# Patient Record
Sex: Female | Born: 1935
Health system: Southern US, Community
[De-identification: ages and names within clinical notes are randomized; demographics above are authoritative.]

## PROBLEM LIST (undated history)

## (undated) DIAGNOSIS — C189 Malignant neoplasm of colon, unspecified: Secondary | ICD-10-CM

## (undated) DIAGNOSIS — Z8619 Personal history of other infectious and parasitic diseases: Secondary | ICD-10-CM

## (undated) DIAGNOSIS — E079 Disorder of thyroid, unspecified: Secondary | ICD-10-CM

## (undated) DIAGNOSIS — F32A Depression, unspecified: Secondary | ICD-10-CM

## (undated) DIAGNOSIS — I10 Essential (primary) hypertension: Secondary | ICD-10-CM

## (undated) DIAGNOSIS — E039 Hypothyroidism, unspecified: Secondary | ICD-10-CM

## (undated) DIAGNOSIS — S62309A Unspecified fracture of unspecified metacarpal bone, initial encounter for closed fracture: Secondary | ICD-10-CM

## (undated) DIAGNOSIS — F329 Major depressive disorder, single episode, unspecified: Secondary | ICD-10-CM

## (undated) DIAGNOSIS — H3530 Unspecified macular degeneration: Secondary | ICD-10-CM

## (undated) HISTORY — PX: HERNIA REPAIR: SHX51

## (undated) HISTORY — PX: COLONOSCOPY: SHX174

---

## 1993-03-15 HISTORY — PX: CHOLECYSTECTOMY: SHX55

## 2005-06-22 ENCOUNTER — Ambulatory Visit: Payer: Self-pay | Admitting: Family Medicine

## 2006-06-28 ENCOUNTER — Ambulatory Visit: Payer: Self-pay | Admitting: Family Medicine

## 2007-08-30 ENCOUNTER — Ambulatory Visit: Payer: Self-pay | Admitting: Family Medicine

## 2010-01-14 ENCOUNTER — Ambulatory Visit: Payer: Self-pay | Admitting: Internal Medicine

## 2010-02-25 ENCOUNTER — Inpatient Hospital Stay: Payer: Self-pay | Admitting: Surgery

## 2010-02-25 HISTORY — PX: COLON SURGERY: SHX602

## 2010-02-25 HISTORY — PX: UMBILICAL HERNIA REPAIR: SHX196

## 2010-02-25 HISTORY — PX: ESOPHAGOGASTRODUODENOSCOPY: SHX1529

## 2010-02-26 LAB — PATHOLOGY REPORT

## 2010-03-30 ENCOUNTER — Ambulatory Visit: Payer: Self-pay | Admitting: Oncology

## 2010-04-15 ENCOUNTER — Ambulatory Visit: Payer: Self-pay | Admitting: Oncology

## 2010-06-11 ENCOUNTER — Ambulatory Visit: Payer: Self-pay | Admitting: Unknown Physician Specialty

## 2010-06-15 LAB — PATHOLOGY REPORT

## 2010-06-29 ENCOUNTER — Ambulatory Visit: Payer: Self-pay | Admitting: Oncology

## 2010-07-14 ENCOUNTER — Ambulatory Visit: Payer: Self-pay | Admitting: Oncology

## 2010-11-25 ENCOUNTER — Ambulatory Visit: Payer: Self-pay | Admitting: General Practice

## 2010-12-09 ENCOUNTER — Ambulatory Visit: Payer: Self-pay | Admitting: General Practice

## 2010-12-09 HISTORY — PX: CARPAL TUNNEL RELEASE: SHX101

## 2010-12-22 ENCOUNTER — Emergency Department: Payer: Self-pay | Admitting: Emergency Medicine

## 2012-02-02 ENCOUNTER — Ambulatory Visit: Payer: Self-pay | Admitting: Internal Medicine

## 2013-02-15 ENCOUNTER — Ambulatory Visit: Payer: Self-pay | Admitting: Internal Medicine

## 2013-05-11 ENCOUNTER — Ambulatory Visit: Payer: Self-pay | Admitting: Unknown Physician Specialty

## 2014-07-04 ENCOUNTER — Ambulatory Visit
Admit: 2014-07-04 | Disposition: A | Discharge status: Home / Self Care | Payer: Self-pay | Attending: Unknown Physician Specialty | Admitting: Unknown Physician Specialty

## 2014-07-10 ENCOUNTER — Ambulatory Visit
Admit: 2014-07-10 | Disposition: A | Discharge status: Home / Self Care | Payer: Self-pay | Attending: Internal Medicine | Admitting: Internal Medicine

## 2015-06-14 HISTORY — PX: MULTIPLE TOOTH EXTRACTIONS: SHX2053

## 2016-02-17 ENCOUNTER — Inpatient Hospital Stay: Admission: RE | Admit: 2016-02-17 | Payer: Self-pay | Source: Ambulatory Visit

## 2016-02-25 ENCOUNTER — Ambulatory Visit: Admit: 2016-02-25 | Payer: Self-pay | Admitting: Orthopedic Surgery

## 2016-02-25 SURGERY — EXCISION, GANGLION CYST, WRIST
Anesthesia: Choice | Laterality: Right

## 2016-06-13 DIAGNOSIS — S62309A Unspecified fracture of unspecified metacarpal bone, initial encounter for closed fracture: Secondary | ICD-10-CM

## 2016-06-13 HISTORY — DX: Unspecified fracture of unspecified metacarpal bone, initial encounter for closed fracture: S62.309A

## 2016-06-28 ENCOUNTER — Encounter: Payer: Self-pay | Admitting: Emergency Medicine

## 2016-06-28 ENCOUNTER — Emergency Department
Admission: EM | Admit: 2016-06-28 | Discharge: 2016-06-28 | Disposition: A | Discharge status: Home / Self Care | Payer: Medicare Other | Attending: Emergency Medicine | Admitting: Emergency Medicine

## 2016-06-28 ENCOUNTER — Emergency Department: Payer: Medicare Other

## 2016-06-28 DIAGNOSIS — Z79899 Other long term (current) drug therapy: Secondary | ICD-10-CM | POA: Diagnosis not present

## 2016-06-28 DIAGNOSIS — Y939 Activity, unspecified: Secondary | ICD-10-CM | POA: Insufficient documentation

## 2016-06-28 DIAGNOSIS — S62645A Nondisplaced fracture of proximal phalanx of left ring finger, initial encounter for closed fracture: Secondary | ICD-10-CM | POA: Insufficient documentation

## 2016-06-28 DIAGNOSIS — S62647A Nondisplaced fracture of proximal phalanx of left little finger, initial encounter for closed fracture: Secondary | ICD-10-CM | POA: Diagnosis not present

## 2016-06-28 DIAGNOSIS — Y999 Unspecified external cause status: Secondary | ICD-10-CM | POA: Diagnosis not present

## 2016-06-28 DIAGNOSIS — Z87891 Personal history of nicotine dependence: Secondary | ICD-10-CM | POA: Diagnosis not present

## 2016-06-28 DIAGNOSIS — Y92009 Unspecified place in unspecified non-institutional (private) residence as the place of occurrence of the external cause: Secondary | ICD-10-CM | POA: Insufficient documentation

## 2016-06-28 DIAGNOSIS — Z85038 Personal history of other malignant neoplasm of large intestine: Secondary | ICD-10-CM | POA: Diagnosis not present

## 2016-06-28 DIAGNOSIS — S6992XA Unspecified injury of left wrist, hand and finger(s), initial encounter: Secondary | ICD-10-CM | POA: Diagnosis present

## 2016-06-28 DIAGNOSIS — S60222A Contusion of left hand, initial encounter: Secondary | ICD-10-CM | POA: Insufficient documentation

## 2016-06-28 DIAGNOSIS — W19XXXA Unspecified fall, initial encounter: Secondary | ICD-10-CM

## 2016-06-28 DIAGNOSIS — S00432A Contusion of left ear, initial encounter: Secondary | ICD-10-CM | POA: Insufficient documentation

## 2016-06-28 DIAGNOSIS — W0110XA Fall on same level from slipping, tripping and stumbling with subsequent striking against unspecified object, initial encounter: Secondary | ICD-10-CM | POA: Insufficient documentation

## 2016-06-28 HISTORY — DX: Malignant neoplasm of colon, unspecified: C18.9

## 2016-06-28 HISTORY — DX: Disorder of thyroid, unspecified: E07.9

## 2016-06-28 MED ORDER — TRAMADOL HCL 50 MG PO TABS: 50.0000 mg | ORAL_TABLET | Freq: Four times a day (QID) | ORAL | 0 refills | Status: DC | PRN

## 2016-06-28 NOTE — ED Notes (Addendum)
FIRST NURSE: pt brought over from [facility] with reports of having a fall this am at home hitting the front of her head, chest and left hand. Pt with swelling and bruising to left hand. Pt did not have any LOC at time of fall. Ice pack applied to left hand.

## 2016-06-28 NOTE — ED Provider Notes (Signed)
[facility] Emergency Department Provider Note  ____________________________________________  Time seen: Approximately 7:55 PM  I have reviewed the triage vital signs and the nursing notes.   HISTORY  Chief Complaint Hand Pain    HPI Sonya Carey is a 83 y.o. female who presents emergency department complaining of left hand pain, swelling, bruising status post fall. Patient reports that she suffered a mechanical fall onto an outstretched left hand. Patient advises initially she had some mild pain to the region but no swelling and no other symptoms. She reports that throughout the day swelling has greatly increased. She was unable to remove her wedding band from her digit became concerned and presents emergency department for recurring to be removed. Patient reports that she did hit her anterior chest wall and her forehead when she fell but there was no loss of consciousness. This occurred several hours prior with no loss of consciousness in the intervening period. Patient denies any headache, visual changes, neck pain, chest pain, shortness of breath, abdominal pain, nausea or vomiting. No medications for hand prior to arrival.   Past Medical History:  Diagnosis Date  . Colon cancer ([place])   . Thyroid disease     There are no active problems to display for this patient.   Past Surgical History:  Procedure Laterality Date  . CHOLECYSTECTOMY    . COLON SURGERY      Prior to Admission medications   Medication Sig Start Date End Date Taking? Authorizing Provider  amLODipine (NORVASC) 5 MG tablet Take 5 mg by mouth daily.   Yes Historical Provider, MD  aspirin 325 MG tablet Take 325 mg by mouth daily.   Yes Historical Provider, MD  cetirizine (ZYRTEC) 10 MG tablet Take 10 mg by mouth daily.   Yes Historical Provider, MD  sertraline (ZOLOFT) 25 MG tablet Take 25 mg by mouth daily.   Yes Historical Provider, MD  traMADol (ULTRAM) 50 MG tablet Take 1 tablet  (50 mg total) by mouth every 6 (six) hours as needed. 06/28/16   Charline Bills Lily Kernen, PA-C    Allergies Patient has no known allergies.  No family history on file.  Social History Social History  Substance Use Topics  . Smoking status: Former Research scientist (life sciences)  . Smokeless tobacco: Not on file  . Alcohol use Not on file     Review of Systems  Constitutional: No fever/chills Eyes: No visual changes.  Cardiovascular: no chest pain. Respiratory: no cough. No SOB. Gastrointestinal: No abdominal pain.  No nausea, no vomiting.  Musculoskeletal:Positive for pain, swelling, bruising to the left hand Skin: Negative for rash, abrasions, lacerations, ecchymosis. Neurological: Negative for headaches, focal weakness or numbness. 10-point ROS otherwise negative.  ____________________________________________   PHYSICAL EXAM:  VITAL SIGNS: ED Triage Vitals  Enc Vitals Group     BP 06/28/16 1824 (!) 161/74     Pulse Rate 06/28/16 1824 77     Resp 06/28/16 1824 18     Temp 06/28/16 1824 98.9 F (37.2 C)     Temp Source 06/28/16 1824 Oral     SpO2 06/28/16 1824 96 %     Weight 06/28/16 1826 145 lb (65.8 kg)     Height 06/28/16 1826 5\' 3"  (1.6 m)     Head Circumference --      Peak Flow --      Pain Score 06/28/16 1824 9     Pain Loc --      Pain Edu? --  Excl. in [place]? --      Constitutional: Alert and oriented. Well appearing and in no acute distress. Eyes: Conjunctivae are normal. PERRL. EOMI. Head: Atraumatic. Neck: No stridor.  No cervical spine tenderness to palpation.  Cardiovascular: Normal rate, regular rhythm. Normal S1 and S2.  Good peripheral circulation. Respiratory: Normal respiratory effort without tachypnea or retractions. Lungs CTAB. Good air entry to the bases with no decreased or absent breath sounds. Musculoskeletal: Full range of motion to all extremities. No gross deformities appreciated.Edema, ecchymosis noted to the left ear but inspection. The stretches from her  third, fourth, fifth digits into the palm of her hand and dorsally to the carpal bones. Area is tender to palpation. Wedding band is entrapped due to edema. Cap refill initially is greater than 3 seconds. Status post ring removal, Refill is less than 2 seconds. Patient is tender to palpation along the MCP joint of the third, fourth, fifth digits. No palpable abnormality. Sensation intact 5 digits. No tenderness to palpation over the wrist. Examination of the elbow is unremarkable. Neurologic:  Normal speech and language. No gross focal neurologic deficits are appreciated.  Skin:  Skin is warm, dry and intact. No rash noted. Psychiatric: Mood and affect are normal. Speech and behavior are normal. Patient exhibits appropriate insight and judgement.   ____________________________________________   LABS (all labs ordered are listed, but only abnormal results are displayed)  Labs Reviewed - No data to display ____________________________________________  EKG   ____________________________________________  RADIOLOGY Diamantina Providence Aryka Coonradt, personally viewed and evaluated these images (plain radiographs) as part of my medical decision making, as well as reviewing the written report by the radiologist.  Dg Hand Complete Left  Result Date: 06/28/2016 CLINICAL DATA:  Tripped and fell. Now with left hand pain, redness, and swelling tube ring and little finger. EXAM: LEFT HAND - COMPLETE 3+ VIEW COMPARISON:  None. FINDINGS: Four views study shows acute fractures through the base of the fourth and fifth proximal phalanges, potentially with extension to the articular surface, especially at the fourth proximal phalanx. Degenerative changes at the MCP joint of the index finger. Degenerative changes noted in scattered IP joints, in the radiocarpal joint and also the first carpometacarpal joint. IMPRESSION: Acute fractures through the base of the little finger and ring finger proximal phalanges. Scattered  degenerative changes. Electronically Signed   By: Misty Stanley M.D.   On: 06/28/2016 18:53    ____________________________________________    PROCEDURES  Procedure(s) performed:    .Foreign Body Removal Date/Time: 06/28/2016 8:51 PM Performed by: Betha Loa D Authorized by: Betha Loa D  Consent: Verbal consent obtained. Consent given by: patient Patient identity confirmed: verbally with patient Intake: Ring finger left hand.  Sedation: Patient sedated: no Patient restrained: no Patient cooperative: yes Complexity: simple 1 objects recovered. Objects recovered: Ring Post-procedure assessment: foreign body removed Comments: Patient had ring entrapment to the left hand. Wedding band was entrapped on the fourth digit. No anesthetic necessary. Electric ring cutter is applied with metal shield between reading and finger. Ring is successfully removed. Good cessation of pain to area. Improvement of cap Refill status post ring removal. .Splint Application Date/Time: 1/61/0960 9:06 PM Performed by: Betha Loa D Authorized by: Betha Loa D   Consent:    Consent obtained:  Verbal   Consent given by:  Patient   Risks discussed:  Pain, swelling, numbness and discoloration Pre-procedure details:    Sensation:  Normal Procedure details:    Laterality:  Left   Location:  Finger   Finger:  R long finger, R ring finger and R small finger   Splint type:  Ulnar gutter   Supplies:  Cotton padding, Ortho-Glass and elastic bandage Post-procedure details:    Pain:  Unchanged   Sensation:  Normal   Patient tolerance of procedure:  Tolerated well, no immediate complications      Medications - No data to display   ____________________________________________   INITIAL IMPRESSION / ASSESSMENT AND PLAN / ED COURSE  Pertinent labs & imaging results that were available during my care of the patient were reviewed by me and considered in my medical  decision making (see chart for details).  Review of the [place] CSRS was performed in accordance of the [place] prior to dispensing any controlled drugs.     Patient's diagnosis is consistent with all resulting in fractures of the fourth and fifth digit. Agents ring was entrapped due to edema. This is successfully removed with improvement in pain as well as improvement in Refill. Patient's hand is splinted and she will follow-up with her orthopedic surgeon. Patient is given limited prescription for low-dose pain medication to be taken as needed.. Patient is given ED precautions to return to the ED for any worsening or new symptoms.     ____________________________________________  FINAL CLINICAL IMPRESSION(S) / ED DIAGNOSES  Final diagnoses:  Closed nondisplaced fracture of proximal phalanx of left ring finger, initial encounter  Closed nondisplaced fracture of proximal phalanx of left little finger, initial encounter  Fall, initial encounter      NEW MEDICATIONS STARTED DURING THIS VISIT:  Discharge Medication List as of 06/28/2016  8:03 PM    START taking these medications   Details  traMADol (ULTRAM) 50 MG tablet Take 1 tablet (50 mg total) by mouth every 6 (six) hours as needed., Starting Mon 06/28/2016, Print            This chart was dictated using voice recognition software/Dragon. Despite best efforts to proofread, errors can occur which can change the meaning. Any change was purely unintentional.    Darletta Moll, PA-C 06/28/16 2107    Carrie Mew, MD 06/28/16 [id number]

## 2016-06-28 NOTE — ED Notes (Signed)
pts left hand ring finger ring cut off using ring cutter. No trauma to the finger. Cap refill in ring finger is longer than 3 seconds.

## 2016-06-28 NOTE — ED Triage Notes (Signed)
Tripped and fell this am, L hand ecchymosis and pain. Has ring on, cannot get off, CSM finger with ring intact.

## 2016-07-13 ENCOUNTER — Inpatient Hospital Stay: Admission: RE | Admit: 2016-07-13 | Payer: Medicare Other | Source: Ambulatory Visit

## 2016-07-19 ENCOUNTER — Other Ambulatory Visit: Payer: PRIVATE HEALTH INSURANCE

## 2016-07-19 ENCOUNTER — Encounter
Admission: RE | Admit: 2016-07-19 | Discharge: 2016-07-19 | Disposition: A | Discharge status: Home / Self Care | Payer: Medicare Other | Source: Ambulatory Visit | Attending: Orthopedic Surgery | Admitting: Orthopedic Surgery

## 2016-07-19 DIAGNOSIS — I10 Essential (primary) hypertension: Secondary | ICD-10-CM | POA: Insufficient documentation

## 2016-07-19 DIAGNOSIS — Z01810 Encounter for preprocedural cardiovascular examination: Secondary | ICD-10-CM | POA: Insufficient documentation

## 2016-07-19 DIAGNOSIS — Z01818 Encounter for other preprocedural examination: Secondary | ICD-10-CM | POA: Diagnosis not present

## 2016-07-19 DIAGNOSIS — Z01812 Encounter for preprocedural laboratory examination: Secondary | ICD-10-CM | POA: Insufficient documentation

## 2016-07-19 HISTORY — DX: Personal history of other infectious and parasitic diseases: Z86.19

## 2016-07-19 HISTORY — DX: Essential (primary) hypertension: I10

## 2016-07-19 HISTORY — DX: Unspecified fracture of unspecified metacarpal bone, initial encounter for closed fracture: S62.309A

## 2016-07-19 HISTORY — DX: Hypothyroidism, unspecified: E03.9

## 2016-07-19 HISTORY — DX: Major depressive disorder, single episode, unspecified: F32.9

## 2016-07-19 HISTORY — DX: Depression, unspecified: F32.A

## 2016-07-19 HISTORY — DX: Unspecified macular degeneration: H35.30

## 2016-07-19 LAB — CBC
HCT: 41.5 % (ref 35.0–47.0)
HEMOGLOBIN: 14.5 g/dL (ref 12.0–16.0)
MCH: 31.8 pg (ref 26.0–34.0)
MCHC: 35.1 g/dL (ref 32.0–36.0)
MCV: 90.7 fL (ref 80.0–100.0)
PLATELETS: 252 10*3/uL (ref 150–440)
RBC: 4.57 MIL/uL (ref 3.80–5.20)
RDW: 13.7 % (ref 11.5–14.5)
WBC: 5 10*3/uL (ref 3.6–11.0)

## 2016-07-19 LAB — BASIC METABOLIC PANEL
Anion gap: 8 (ref 5–15)
BUN: 11 mg/dL (ref 6–20)
CHLORIDE: 100 mmol/L — AB (ref 101–111)
CO2: 27 mmol/L (ref 22–32)
CREATININE: 0.68 mg/dL (ref 0.44–1.00)
Calcium: 9.3 mg/dL (ref 8.9–10.3)
GFR calc Af Amer: >60 mL/min (ref >60)
GFR calc non Af Amer: >60 mL/min (ref >60)
GLUCOSE: 84 mg/dL (ref 65–99)
POTASSIUM: 3.9 mmol/L (ref 3.5–5.1)
SODIUM: 135 mmol/L (ref 135–145)

## 2016-07-19 NOTE — Patient Instructions (Signed)
  Your procedure is scheduled on: Monday, Jul 26, 2016 Report to Same Day Surgery 2nd floor medical mall ([place] Entrance-take elevator on left to 2nd floor.  Check in with surgery information desk.) To find out your arrival time please call [phone] between 1PM - 3PM on Friday, Jul 23, 2016  Remember: Instructions that are not followed completely may result in serious medical risk, up to and including death, or upon the discretion of your surgeon and anesthesiologist your surgery may need to be rescheduled.    _x___ 1. Do not eat food or drink liquids after midnight. No gum chewing or hard candies.      __x__ 2. No Alcohol for 24 hours before or after surgery.   ____  4. Bring all medications with you on the day of surgery if instructed.    __x__ 5. Notify your doctor if there is any change in your medical condition     (cold, fever, infections).     Do not wear jewelry, make-up, hairpins, clips or nail polish.  Do not wear lotions, powders, or perfumes. You may wear deodorant.  Do not shave 48 hours prior to surgery. Men may shave face and neck.  Do not bring valuables to the hospital.    [facility] is not responsible for any belongings or valuables.               Contacts, dentures or bridgework may not be worn into surgery.   Patients discharged the day of surgery will not be allowed to drive home.  You will need someone to drive you home and stay with you the night of your procedure.    Please read over the following fact sheets that you were given:   [facility] Preparing for Surgery and or MRSA Information   _x___ Take these medications the morning of surgery with a SIP of water:   1. AMLODIPINE  2. ZOLOFT  3. THYROID    _x___ Use CHG Soap or sage wipes as directed on instruction sheet   _x___ Follow recommendations from Cardiologist, Pulmonologist or PCP regarding stopping Aspirin.  DO NOT TAKE ASPIRIN FROM NOW TIL AFTER SURGERY  X____Stop  Anti-inflammatories such as Advil, Aleve, Ibuprofen, Motrin, Naproxen, Naprosyn, Goodies powders or aspirin products. OK to take Tylenol.   _x___ Stop supplements until after surgery. ([place], [place] [address])

## 2016-07-19 NOTE — Pre-Procedure Instructions (Signed)
Patient given incentive spirometer and instructed patient with patient's daughter present on how to use it after surgery.

## 2016-07-19 NOTE — Pre-Procedure Instructions (Signed)
EKG READ BY DR KARENZ AND OK 

## 2016-07-26 ENCOUNTER — Ambulatory Visit
Admission: RE | Admit: 2016-07-26 | Discharge: 2016-07-26 | Disposition: A | Discharge status: Home / Self Care | Payer: Medicare Other | Source: Ambulatory Visit | Attending: Orthopedic Surgery | Admitting: Orthopedic Surgery

## 2016-07-26 ENCOUNTER — Encounter: Payer: Self-pay | Admitting: Orthopedic Surgery

## 2016-07-26 ENCOUNTER — Ambulatory Visit: Payer: Medicare Other | Admitting: Anesthesiology

## 2016-07-26 ENCOUNTER — Encounter
Admission: RE | Disposition: A | Discharge status: Home / Self Care | Payer: Self-pay | Source: Ambulatory Visit | Attending: Orthopedic Surgery

## 2016-07-26 DIAGNOSIS — E039 Hypothyroidism, unspecified: Secondary | ICD-10-CM | POA: Diagnosis not present

## 2016-07-26 DIAGNOSIS — Z85038 Personal history of other malignant neoplasm of large intestine: Secondary | ICD-10-CM | POA: Insufficient documentation

## 2016-07-26 DIAGNOSIS — Z79899 Other long term (current) drug therapy: Secondary | ICD-10-CM | POA: Diagnosis not present

## 2016-07-26 DIAGNOSIS — M67431 Ganglion, right wrist: Secondary | ICD-10-CM | POA: Diagnosis present

## 2016-07-26 DIAGNOSIS — Z7982 Long term (current) use of aspirin: Secondary | ICD-10-CM | POA: Diagnosis not present

## 2016-07-26 HISTORY — PX: GANGLION CYST EXCISION: SHX1691

## 2016-07-26 SURGERY — EXCISION, GANGLION CYST, WRIST
Anesthesia: General | Laterality: Right | Wound class: Clean

## 2016-07-26 MED ORDER — LIDOCAINE HCL (PF) 2 % IJ SOLN
INTRAMUSCULAR | Status: AC
  Filled 2016-07-26: qty 2

## 2016-07-26 MED ORDER — FAMOTIDINE 20 MG PO TABS
20.0000 mg | ORAL_TABLET | Freq: Once | ORAL | Status: AC
  Administered 2016-07-26: 20 mg via ORAL

## 2016-07-26 MED ORDER — METOCLOPRAMIDE HCL 5 MG/ML IJ SOLN: 5.0000 mg | Freq: Three times a day (TID) | INTRAMUSCULAR | Status: DC | PRN

## 2016-07-26 MED ORDER — ONDANSETRON HCL 4 MG/2ML IJ SOLN
INTRAMUSCULAR | Status: DC | PRN
  Administered 2016-07-26: 4 mg via INTRAVENOUS

## 2016-07-26 MED ORDER — ONDANSETRON HCL 4 MG PO TABS: 4.0000 mg | ORAL_TABLET | Freq: Four times a day (QID) | ORAL | Status: DC | PRN

## 2016-07-26 MED ORDER — FENTANYL CITRATE (PF) 100 MCG/2ML IJ SOLN
INTRAMUSCULAR | Status: AC
  Filled 2016-07-26: qty 2

## 2016-07-26 MED ORDER — FENTANYL CITRATE (PF) 100 MCG/2ML IJ SOLN
25.0000 ug | INTRAMUSCULAR | Status: DC | PRN
  Administered 2016-07-26 (×4): 25 ug via INTRAVENOUS

## 2016-07-26 MED ORDER — PROPOFOL 10 MG/ML IV BOLUS
INTRAVENOUS | Status: AC
  Filled 2016-07-26: qty 20

## 2016-07-26 MED ORDER — BUPIVACAINE HCL (PF) 0.25 % IJ SOLN
INTRAMUSCULAR | Status: DC | PRN
  Administered 2016-07-26: 4 mL

## 2016-07-26 MED ORDER — FENTANYL CITRATE (PF) 100 MCG/2ML IJ SOLN
INTRAMUSCULAR | Status: DC | PRN
  Administered 2016-07-26: 1 ug via INTRAVENOUS

## 2016-07-26 MED ORDER — ONDANSETRON HCL 4 MG/2ML IJ SOLN
INTRAMUSCULAR | Status: AC
  Filled 2016-07-26: qty 2

## 2016-07-26 MED ORDER — METOCLOPRAMIDE HCL 10 MG PO TABS
5.0000 mg | ORAL_TABLET | Freq: Three times a day (TID) | ORAL | Status: DC | PRN
Start: 2016-07-26 — End: 2016-07-26

## 2016-07-26 MED ORDER — HYDROCODONE-ACETAMINOPHEN 5-325 MG PO TABS: 1.0000 | ORAL_TABLET | ORAL | 0 refills | Status: DC | PRN

## 2016-07-26 MED ORDER — ONDANSETRON HCL 4 MG/2ML IJ SOLN: 4.0000 mg | Freq: Four times a day (QID) | INTRAMUSCULAR | Status: DC | PRN

## 2016-07-26 MED ORDER — LACTATED RINGERS IV SOLN
INTRAVENOUS | Status: DC
  Administered 2016-07-26 (×2): via INTRAVENOUS

## 2016-07-26 MED ORDER — BUPIVACAINE HCL (PF) 0.25 % IJ SOLN
INTRAMUSCULAR | Status: AC
  Filled 2016-07-26: qty 30

## 2016-07-26 MED ORDER — FENTANYL CITRATE (PF) 100 MCG/2ML IJ SOLN
INTRAMUSCULAR | Status: AC
  Administered 2016-07-26: 25 ug via INTRAVENOUS
  Filled 2016-07-26: qty 2

## 2016-07-26 MED ORDER — CHLORHEXIDINE GLUCONATE 4 % EX LIQD: 60.0000 mL | Freq: Once | CUTANEOUS | Status: DC

## 2016-07-26 MED ORDER — PHENYLEPHRINE HCL 10 MG/ML IJ SOLN
INTRAMUSCULAR | Status: DC | PRN
  Administered 2016-07-26 (×3): 100 ug via INTRAVENOUS

## 2016-07-26 MED ORDER — PROPOFOL 10 MG/ML IV BOLUS
INTRAVENOUS | Status: DC | PRN
  Administered 2016-07-26: 50 mg via INTRAVENOUS

## 2016-07-26 MED ORDER — FAMOTIDINE 20 MG PO TABS
ORAL_TABLET | ORAL | Status: AC
  Administered 2016-07-26: 20 mg via ORAL
  Filled 2016-07-26: qty 1

## 2016-07-26 MED ORDER — ONDANSETRON HCL 4 MG/2ML IJ SOLN: 4.0000 mg | Freq: Once | INTRAMUSCULAR | Status: DC | PRN

## 2016-07-26 MED ORDER — LIDOCAINE HCL (CARDIAC) 20 MG/ML IV SOLN
INTRAVENOUS | Status: DC | PRN
  Administered 2016-07-26: 60 mg via INTRAVENOUS

## 2016-07-26 SURGICAL SUPPLY — 25 items
BNDG ESMARK 4X12 TAN STRL LF (GAUZE/BANDAGES/DRESSINGS) ×3 IMPLANT
BNDG STRETCH 4X75 STRL LF (GAUZE/BANDAGES/DRESSINGS) ×3 IMPLANT
CANISTER SUCT 1200ML W/VALVE (MISCELLANEOUS) ×3 IMPLANT
CLOSURE WOUND 1/2 X4 (GAUZE/BANDAGES/DRESSINGS) ×1
CUFF TOURN 18 STER (MISCELLANEOUS) ×3 IMPLANT
DRSG DERMACEA 8X12 NADH (GAUZE/BANDAGES/DRESSINGS) ×3 IMPLANT
DURAPREP 26ML APPLICATOR (WOUND CARE) ×3 IMPLANT
ELECT REM PT RETURN 9FT ADLT (ELECTROSURGICAL) ×3
ELECTRODE REM PT RTRN 9FT ADLT (ELECTROSURGICAL) ×1 IMPLANT
GAUZE SPONGE 4X4 12PLY STRL (GAUZE/BANDAGES/DRESSINGS) ×3 IMPLANT
GLOVE BIO SURGEON STRL SZ8 (GLOVE) ×3 IMPLANT
GLOVE BIOGEL M STRL SZ7.5 (GLOVE) ×3 IMPLANT
GOWN STRL REUS W/ TWL LRG LVL3 (GOWN DISPOSABLE) ×2 IMPLANT
GOWN STRL REUS W/TWL LRG LVL3 (GOWN DISPOSABLE) ×4
KIT RM TURNOVER STRD PROC AR (KITS) ×3 IMPLANT
NS IRRIG 500ML POUR BTL (IV SOLUTION) ×3 IMPLANT
PACK EXTREMITY ARMC (MISCELLANEOUS) ×3 IMPLANT
SOL PREP PVP 2OZ (MISCELLANEOUS) ×3
SOLUTION PREP PVP 2OZ (MISCELLANEOUS) ×1 IMPLANT
STOCKINETTE BIAS CUT 4 980044 (GAUZE/BANDAGES/DRESSINGS) ×3 IMPLANT
STRIP CLOSURE SKIN 1/2X4 (GAUZE/BANDAGES/DRESSINGS) ×2 IMPLANT
SUT ETHILON 5-0 FS-2 18 BLK (SUTURE) ×3 IMPLANT
SUT VIC AB 2-0 SH 27 (SUTURE)
SUT VIC AB 2-0 SH 27XBRD (SUTURE) IMPLANT
SUT VIC AB 3-0 PS2 18 (SUTURE) ×3 IMPLANT

## 2016-07-26 NOTE — Anesthesia Preprocedure Evaluation (Signed)
Anesthesia Evaluation  Patient identified by MRN, date of birth, ID band Patient awake    Reviewed: Allergy & Precautions, H&P , NPO status , Patient's Chart, lab work & pertinent test results, reviewed documented beta blocker date and time   Airway Mallampati: II  TM Distance: >3 FB Neck ROM: full    Dental  (+) Teeth Intact   Pulmonary neg pulmonary ROS, former smoker,    Pulmonary exam normal        Cardiovascular Exercise Tolerance: Good hypertension, negative cardio ROS Normal cardiovascular exam Rate:Normal     Neuro/Psych PSYCHIATRIC DISORDERS negative neurological ROS  negative psych ROS   GI/Hepatic negative GI ROS, Neg liver ROS,   Endo/Other  negative endocrine ROSHypothyroidism   Renal/GU negative Renal ROS  negative genitourinary   Musculoskeletal   Abdominal   Peds  Hematology negative hematology ROS (+)   Anesthesia Other Findings   Reproductive/Obstetrics negative OB ROS                             Anesthesia Physical Anesthesia Plan  ASA: II  Anesthesia Plan: General LMA   Post-op Pain Management:    Induction:   Airway Management Planned:   Additional Equipment:   Intra-op Plan:   Post-operative Plan:   Informed Consent: I have reviewed the patients History and Physical, chart, labs and discussed the procedure including the risks, benefits and alternatives for the proposed anesthesia with the patient or authorized representative who has indicated his/her understanding and acceptance.     Plan Discussed with: CRNA  Anesthesia Plan Comments:         Anesthesia Quick Evaluation

## 2016-07-26 NOTE — Anesthesia Postprocedure Evaluation (Signed)
Anesthesia Post Note  Patient: Sonya Carey  Procedure(s) Performed: Procedure(s) (LRB): REMOVAL GANGLION OF WRIST (Right)  Patient location during evaluation: PACU Anesthesia Type: General Level of consciousness: awake and alert and oriented Pain management: pain level controlled Vital Signs Assessment: post-procedure vital signs reviewed and stable Respiratory status: spontaneous breathing, nonlabored ventilation and respiratory function stable Cardiovascular status: blood pressure returned to baseline and stable Postop Assessment: no signs of nausea or vomiting Anesthetic complications: no     Last Vitals:  Vitals:   07/26/16 1721 07/26/16 1724  BP: (!) 108/52 (!) 106/54  Pulse: 61 60  Resp: 13   Temp: 36.9 C     Last Pain:  Vitals:   07/26/16 1721  TempSrc:   PainSc: 3                  Amedio Bowlby

## 2016-07-26 NOTE — Op Note (Signed)
OPERATIVE NOTE  DATE OF SURGERY:  07/26/2016  PATIENT NAME:  Sonya Carey   DOB: 07-18-1924  MRN: [id number]   PRE-OPERATIVE DIAGNOSIS:  Dorsal ganglion cyst of the right wrist  POST-OPERATIVE DIAGNOSIS:  Same  PROCEDURE:  Excision of dorsal ganglion cyst from the right wrist  SURGEON:  Marciano Sequin., M.D.   ASSISTANT: None  ANESTHESIA: general  ESTIMATED BLOOD LOSS: Minimal  FLUIDS REPLACED: 700 mL of crystalloid  TOURNIQUET TIME: 25 minutes   DRAINS: None  IMPLANTS UTILIZED: None  INDICATIONS FOR SURGERY: Sonya Carey is a 83 y.o. year old female who has been seen for complaints of a cystic swelling to the dorsum of the right wrist. Findings were consistent with a ganglion cyst.. After discussion of the risks and benefits of surgical intervention, the patient expressed understanding of the risks benefits and agree with plans for excision of the ganglion cyst from the right wrist.   PROCEDURE IN DETAIL: The patient was brought into the operating room and, after adequate general anesthesia was achieved, tourniquet was placed on patient's upper right arm. The patient's right hand and arm were cleaned and prepped with alcohol and DuraPrep drape usual sterile fashion. A "timeout" was performed as per usual protocol. Loupe magnification was used throughout the procedure. The right upper extremity was exsanguinated using an Esmarch, the tourniquet was inflated to 250 mmHg. A transverse incision was made directly over the palpable mass. Dissection was carried down around the cystic lesion using tenotomy scissors. The cyst appeared to be multiloculated. The larger lesion was excised and measured approximately 1-1/2 to 2 cm. The second lesion measured approximately 8 mm. Both specimens were submitted to pathology. The ganglions appeared to originate from the tendon sheath of the extensor tendons. The wound was irrigated with copious amounts of normal saline, solution. Tourniquet  was deflated after total tourniquet time of 25 minutes. Hemostasis was achieved using electrocautery. The wound was closed in layers using first #3-0 Vicryl. Skin was closed with sutures of #5-0 nylon. 4 mm of 0.25% Marcaine was injected along the incision site. A sterile dressing was applied.  The patient tolerated the procedure well. She was transported to the recovery room in stable condition.  James P. Holley Bouche M.D.

## 2016-07-26 NOTE — Anesthesia Procedure Notes (Signed)
Procedure Name: LMA Insertion Date/Time: 07/26/2016 3:30 PM Performed by: Nelda Marseille Pre-anesthesia Checklist: Patient identified, Patient being monitored, Timeout performed, Emergency Drugs available and Suction available Patient Re-evaluated:Patient Re-evaluated prior to inductionOxygen Delivery Method: Circle system utilized Preoxygenation: Pre-oxygenation with 100% oxygen Intubation Type: IV induction Ventilation: Mask ventilation without difficulty LMA: LMA inserted LMA Size: 3.0 Tube type: Oral Number of attempts: 1 Placement Confirmation: positive ETCO2 and breath sounds checked- equal and bilateral Tube secured with: Tape Dental Injury: Teeth and Oropharynx as per pre-operative assessment

## 2016-07-26 NOTE — Transfer of Care (Signed)
Immediate Anesthesia Transfer of Care Note  Patient: Sonya Carey  Procedure(s) Performed: Procedure(s): REMOVAL GANGLION OF WRIST (Right)  Patient Location: PACU  Anesthesia Type:General  Level of Consciousness: sedated  Airway & Oxygen Therapy: Patient Spontanous Breathing and Patient connected to face mask oxygen  Post-op Assessment: Report given to RN and Post -op Vital signs reviewed and stable  Post vital signs: Reviewed and stable  Last Vitals:  Vitals:   07/26/16 1430  BP: 115/90  Pulse: (!) 101  Resp: 18  Temp: 36.5 C    Last Pain:  Vitals:   07/26/16 1430  TempSrc: Tympanic  PainSc: 3          Complications: No apparent anesthesia complications

## 2016-07-26 NOTE — Discharge Instructions (Signed)
AMBULATORY SURGERY  °DISCHARGE INSTRUCTIONS ° ° °1) The drugs that you were given will stay in your system until tomorrow so for the next 24 hours you should not: ° °A) Drive an automobile °B) Make any legal decisions °C) Drink any alcoholic beverage ° ° °2) You may resume regular meals tomorrow.  Today it is better to start with liquids and gradually work up to solid foods. ° °You may eat anything you prefer, but it is better to start with liquids, then soup and crackers, and gradually work up to solid foods. ° ° °3) Please notify your doctor immediately if you have any unusual bleeding, trouble breathing, redness and pain at the surgery site, drainage, fever, or pain not relieved by medication. ° ° ° °4) Additional Instructions: ° ° ° ° ° ° ° °Please contact your physician with any problems or Same Day Surgery at [phone], Monday through Friday 6 am to 4 pm, or [place] [facility] [place] Main number at [phone]. ° °Instructions after Hand / Wrist Surgery ° ° James P. Hooten, Jr., M.D. ° Dept. of Orthopaedics & Sports Medicine ° [facility] ° [address] ° [place], [place]  [address] ° ° Phone: [phone]   Fax: [phone] ° ° °DIET: °• Drink plenty of non-alcoholic fluids & begin a light diet. °• Resume your normal diet the day after surgery. ° °ACTIVITY:  °• Keep the hand elevated above the level of the elbow. °• Begin gently moving the fingers on a regular basis to avoid stiffness. °• Avoid any heavy lifting, pushing, or pulling with the operative hand. °• Do not drive or operate any equipment until instructed. ° °WOUND CARE:  °• Keep the splint/bandage clean and dry.  °• The splint and stitches will be removed in the office. °• Continue to use the ice packs periodically to reduce pain and swelling. °• You may bathe or shower after the stitches are removed at the first office visit following surgery. ° °MEDICATIONS: °• You may resume your regular medications. °• Please take the pain medication  as prescribed. °• Do not take pain medication on an empty stomach. °• Do not drive or drink alcoholic beverages when taking pain medications. ° °CALL THE OFFICE FOR: °• Temperature above 101 degrees °• Excessive bleeding or drainage on the dressing. °• Excessive swelling, coldness, or paleness of the fingers. °• Persistent nausea and vomiting. ° °FOLLOW-UP:  °• You should have an appointment to return to the office in 7-10 days after surgery.  ° °REMEMBER: R.I.C.E. = Rest, Ice, Compression, Elevation !  °

## 2016-07-26 NOTE — Anesthesia Post-op Follow-up Note (Cosign Needed)
Anesthesia QCDR form completed.        

## 2016-07-26 NOTE — H&P (Signed)
The patient has been re-examined, and the chart reviewed, and there have been no interval changes to the documented history and physical.    The risks, benefits, and alternatives have been discussed at length. The patient expressed understanding of the risks benefits and agreed with plans for surgical intervention.  James P. Hooten, Jr. M.D.    

## 2016-07-27 ENCOUNTER — Encounter: Payer: Self-pay | Admitting: Orthopedic Surgery

## 2016-07-28 LAB — SURGICAL PATHOLOGY

## 2016-09-20 ENCOUNTER — Other Ambulatory Visit: Payer: Self-pay | Admitting: Internal Medicine

## 2016-09-20 DIAGNOSIS — R413 Other amnesia: Secondary | ICD-10-CM

## 2016-09-20 DIAGNOSIS — Z9181 History of falling: Secondary | ICD-10-CM

## 2016-09-20 DIAGNOSIS — I10 Essential (primary) hypertension: Secondary | ICD-10-CM

## 2016-09-24 ENCOUNTER — Ambulatory Visit: Payer: PRIVATE HEALTH INSURANCE

## 2016-09-27 ENCOUNTER — Ambulatory Visit
Admission: RE | Admit: 2016-09-27 | Discharge: 2016-09-27 | Disposition: A | Discharge status: Home / Self Care | Payer: Medicare Other | Source: Ambulatory Visit | Attending: Internal Medicine | Admitting: Internal Medicine

## 2016-09-27 DIAGNOSIS — I10 Essential (primary) hypertension: Secondary | ICD-10-CM | POA: Insufficient documentation

## 2016-09-27 DIAGNOSIS — E237 Disorder of pituitary gland, unspecified: Secondary | ICD-10-CM | POA: Diagnosis not present

## 2016-09-27 DIAGNOSIS — R413 Other amnesia: Secondary | ICD-10-CM | POA: Insufficient documentation

## 2016-09-27 DIAGNOSIS — Z9181 History of falling: Secondary | ICD-10-CM | POA: Diagnosis not present

## 2016-10-06 ENCOUNTER — Other Ambulatory Visit: Payer: Self-pay | Admitting: Neurosurgery

## 2016-10-06 DIAGNOSIS — E237 Disorder of pituitary gland, unspecified: Secondary | ICD-10-CM

## 2016-10-11 ENCOUNTER — Encounter: Payer: Self-pay | Admitting: Psychology

## 2016-10-11 ENCOUNTER — Ambulatory Visit (INDEPENDENT_AMBULATORY_CARE_PROVIDER_SITE_OTHER): Payer: Medicare Other | Admitting: Psychology

## 2016-10-11 DIAGNOSIS — R413 Other amnesia: Secondary | ICD-10-CM

## 2016-10-11 NOTE — Progress Notes (Signed)
   Neuropsychology Note  Sonya Carey came in today for 1 hour of neuropsychological testing with technician, Milana Kidney, BS, under the supervision of Dr. Macarthur Critchley. The patient did not appear overtly distressed by the testing session, per behavioral observation or via self-report to the technician. Rest breaks were offered. Sonya Carey will return within 2 weeks for a feedback session with Dr. Si Raider at which time her test performances, clinical impressions and treatment recommendations will be reviewed in detail. The patient understands she can contact our office should she require our assistance before this time.  Full report to follow.

## 2016-10-11 NOTE — Progress Notes (Signed)
NEUROPSYCHOLOGICAL INTERVIEW (CPT: [id number])  Name: Sonya Carey Date of Birth: 04/09/1924 Date of Interview: 10/11/2016  Reason for Referral:  ALYRIA KRACK is a 83 y.o. left-handed female who is referred for neuropsychological evaluation by Dr. Tracie Harrier of [facility] in [place] due to concerns about memory. This patient is accompanied in the office by her daughter, [name], who supplements the history.  History of Presenting Problem:  Mrs. Puffenbarger and her daughter reported gradual onset of short term memory loss over the past 2-3 years with some progression over time. [name] reports that the patient's memory decline was not interfering with functioning until January of this year when the patient could not do her taxes. She had done them every year before that. It was very upsetting to her that she could not figure them out. She saw her PCP and was prescribed Aricept. She was also prescribed Zoloft for grief related depression. She had a fall in April of this year and injured her hand. She underwent brain MRI on 09/27/2016 which reportedly revealed moderate atrophy, moderate chronic changes in the pons and white matter consistent with microvascular ischemia, no acute infarct, and 1 cm multilocular cystic lesion in the right side of the pituitary with fluid fluid level within the cyst. She saw Dr. Lacinda Axon for neurosurgery consult on 10/05/2016 who stated this was likely an incidental finding on workup and not related to her memory difficulty. He did not recommend any intervention as neither biopsy nor resection would carry any added benefit. Repeat MRI in 4-6 months was recommended in order to monitor.   Upon direct questioning, the patient and her daughter reported the following with regard to current cognitive functioning:  Forgetting recent conversations/events: Yes per daughter Misplacing/losing items: Not a lot. Sometimes difficulty finding where she  put medications. Forgetting appointments or other obligations: Yes per daughter Forgetting to take medications: Yes Difficulty concentrating: No. Reads paper every day, plays cards (although not as much lately) Starting but not finishing tasks: No per patient, yes per daughter Word-finding difficulty: No, except she does forget names Comprehension difficulty: No Getting lost when driving: No  Mrs. Granville lives in [place]. She lives alone since her husband passed away in late 02-Jul-2014. She was driving up until April when she had her fall and hurt her hand. She agreed to her daughter taking her car, and she will not return to driving until after she has an operation in August and after she has occupational therapy at [facility] for driving. Her daughter assists with management of medications (fills her pillboxes and reminds her to take them), finances (she bounced some checks in the past few months and can no longer do her taxes), and appointments (they manage them together).   The patient was the primary caregiver for her husband until he passed away in late 2014-07-02. She has been experiencing significant grief related to his loss but Zoloft has been helpful. Her mood has improved significantly in the past month or so. She is calmer and more amenable to getting assistance with things. She is still grieving, but her grief is not preventing her from doing things.   Psychiatric history was denied. She has never seen a mental health provider and she and her daughter denied any history of significant depression or anxiety prior to losing her husband. She also has no remote or recent history of hallucinations or psychosis.  Physically, the patient has no current complaints. She denies any difficulty with  walking or balance. She uses a cane outside of her house. She has not had any more falls since the one in April. She denies any sleep difficulty but her daughter notes that she has reported having difficulty  falling asleep, sometimes staying up until 3 am if she cannot fall asleep. She is sleeping much longer/later in the morning/day. She has had gradual weight loss over the past 3 years with low appetite. Her appetite does seem to have improved and her weight seems to have leveled off recently. She had colon cancer 5 years ago which was self contained and treated surgically. She had no recurrence.    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 Marital history: Widowed, lives alone, 4 children (3 sons and 1 daughter), 26 grands, 4 greats Alcohol: Very minimal alcohol, rare glass of wine Tobacco: Former smoker, quit 1974   Medical History: Past Medical History:  Diagnosis Date  . Colon cancer ([place])    adenocarcinoma of transverse colon  . Depression   . Fracture, metacarpal 06/2016   5th metacarpal  . History of chicken pox   . Hypertension   . Hypothyroidism   . Macular degeneration    left eye  . Thyroid disease      Current Medications:  Outpatient Encounter Prescriptions as of 10/11/2016  Medication Sig  . amLODipine (NORVASC) 5 MG tablet Take 5 mg by mouth daily.  Marland Kitchen aspirin 325 MG tablet Take 650 mg by mouth daily as needed.   Marland Kitchen b complex vitamins tablet Take 1 tablet by mouth daily.  . Calcium 500 MG tablet Take 500 mg by mouth 3 (three) times daily.  . cetirizine (ZYRTEC) 10 MG tablet Take 10 mg by mouth as needed.   . Cholecalciferol 1000 units tablet Take 1,000 Units by mouth daily.  Marland Kitchen donepezil (ARICEPT) 5 MG tablet Take 5 mg by mouth at bedtime.  Marland Kitchen HYDROcodone-acetaminophen (NORCO) 5-325 MG tablet Take 1-2 tablets by mouth every 4 (four) hours as needed for moderate pain.  . NON FORMULARY Take 1 tablet by mouth 2 (two) times daily. Shaklee herbal supplement  . omega-3 acid ethyl esters (LOVAZA) 1 g capsule Take 1 g by mouth 2  (two) times daily.  . sertraline (ZOLOFT) 25 MG tablet Take 25 mg by mouth daily.  Marland Kitchen thyroid (ARMOUR) 60 MG tablet Take 60 mg by mouth daily before breakfast.  . Zinc 50 MG TABS Take 1 tablet by mouth daily.   No facility-administered encounter medications on file as of 10/11/2016.     Xanax PRN is prescribed but she is not using it at all.   Behavioral Observations:   Appearance: Neatly and appropriately dressed and groomed Gait: Ambulated with a cane, no gross abnormalities observed Speech: Fluent; normal rate, rhythm and volume. No significant word finding difficulty. Repeats herself frequently due to memory loss. Thought process: Appears linear Affect: Full, generally euthymic; does become tearful when talking about her husband Interpersonal: Pleasant, appropriate   TESTING: There is medical necessity to proceed with neuropsychological assessment as the results will be used to aid in differential diagnosis and clinical decision-making and to inform specific treatment recommendations. Per the patient, her daughter and medical records reviewed, there has been a change in cognitive functioning and a reasonable suspicion of Alzheimer's dementia.  Following the clinical interview, the patient completed a full battery of neuropsychological testing with  my psychometrician under my supervision.   PLAN: The patient will return to see me for a follow-up session at which time her test performances and my impressions and treatment recommendations will be reviewed in detail.  Full report to follow.

## 2016-10-12 ENCOUNTER — Ambulatory Visit
Admission: RE | Admit: 2016-10-12 | Discharge: 2016-10-12 | Disposition: A | Discharge status: Home / Self Care | Payer: Medicare Other | Source: Ambulatory Visit | Attending: Neurosurgery | Admitting: Neurosurgery

## 2016-10-28 NOTE — Progress Notes (Signed)
NEUROPSYCHOLOGICAL EVALUATION   Name:    Sonya Carey  Date of Birth:   02/14/1925 Date of Interview:  10/11/2016 Date of Testing:  10/11/2016   Date of Feedback:  11/01/2016       Background Information:  Reason for Referral:  Sonya Carey is a 83 y.o. left handed female referred by Dr. Tracie Carey of [facility] to assess her current level of cognitive functioning and assist in differential diagnosis. The current evaluation consisted of a review of available medical records, an interview with the patient and her daughter, Sonya Carey, and the completion of a neuropsychological testing battery. Informed consent was obtained.  History of Presenting Problem:  Sonya Carey and her daughter reported gradual onset of short term memory loss over the past 2-3 years with some progression over time. Sonya Carey reports that the patient's memory decline was not interfering with functioning until January of this year when the patient could not do her taxes. She had done them every year before that. It was very upsetting to her that she could not figure them out. She saw her PCP and was prescribed Aricept. She was also prescribed Zoloft for grief related depression. She had a fall in April of this year and injured her hand. She underwent brain MRI on 09/27/2016 which reportedly revealed moderate atrophy, moderate chronic changes in the pons and white matter consistent with microvascular ischemia, no acute infarct, and 1 cm multilocular cystic lesion in the right side of the pituitary with fluid fluid level within the cyst. She saw Dr. Lacinda Carey for neurosurgery consult on 10/05/2016 who stated this was likely an incidental finding on workup and not related to her memory difficulty. He did not recommend any intervention as neither biopsy nor resection would carry any added benefit. Repeat MRI in 4-6 months was recommended in order to monitor.   Upon direct questioning, the  patient and her daughter reported the following with regard to current cognitive functioning:  Forgetting recent conversations/events: Yes per daughter Misplacing/losing items: Not a lot. Sometimes difficulty finding where she put medications. Forgetting appointments or other obligations: Yes per daughter Forgetting to take medications: Yes Difficulty concentrating: No. Reads paper every day, plays cards (although not as much lately) Starting but not finishing tasks: No per patient, yes per daughter Word-finding difficulty: No, except she does forget names Comprehension difficulty: No Getting lost when driving: No  Sonya Carey lives in [place]. She lives alone since her husband passed away in late 05/30/14. She was driving up until April when she had her fall and hurt her hand. She agreed to her daughter taking her car, and she will not return to driving until after she has an operation in August and after she has occupational therapy at [facility] for driving. Her daughter assists with management of medications (fills her pillboxes and reminds her to take them), finances (she bounced some checks in the past few months and can no longer do her taxes), and appointments (they manage them together).   The patient was the primary caregiver for her husband until he passed away in late 05/30/14. She has been experiencing significant grief related to his loss but Zoloft has been helpful. Her mood has improved significantly in the past month or so. She is calmer and more amenable to getting assistance with things. She is still grieving, but her grief is not preventing her from doing things.   Psychiatric history was denied. She has never seen a  mental health provider and she and her daughter denied any history of significant depression or anxiety prior to losing her husband. She also has no remote or recent history of hallucinations or psychosis.  Physically, the patient has no current complaints. She  denies any difficulty with walking or balance. She uses a cane outside of her house. She has not had any more falls since the one in April. She denies any sleep difficulty but her daughter notes that she has reported having difficulty falling asleep, sometimes staying up until 3 am if she cannot fall asleep. She is sleeping much longer/later in the morning/day. She has had gradual weight loss over the past 3 years with low appetite. Her appetite does seem to have improved and her weight seems to have leveled off recently. She had colon cancer 5 years ago which was self contained and treated surgically. She had no recurrence.    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 Marital history: Widowed, lives alone, 4 children (3 sons and 1 daughter), 38 grands, 4 greats Alcohol: Very minimal alcohol, rare glass of wine Tobacco: Former smoker, quit 1974   Medical History:  Past Medical History:  Diagnosis Date  . Colon cancer ([place])    adenocarcinoma of transverse colon  . Depression   . Fracture, metacarpal 06/2016   5th metacarpal  . History of chicken pox   . Hypertension   . Hypothyroidism   . Macular degeneration    left eye  . Thyroid disease     Current medications:  Outpatient Encounter Prescriptions as of 11/01/2016  Medication Sig  . ALPRAZolam (XANAX) 0.25 MG tablet Take 0.25 mg by mouth daily.  Marland Kitchen amLODipine (NORVASC) 5 MG tablet Take 5 mg by mouth daily.  Marland Kitchen aspirin 325 MG tablet Take 650 mg by mouth daily as needed.   Marland Kitchen b complex vitamins tablet Take 1 tablet by mouth daily.  . Calcium 500 MG tablet Take 500 mg by mouth 3 (three) times daily.  . cetirizine (ZYRTEC) 10 MG tablet Take 10 mg by mouth as needed.   . Cholecalciferol 1000 units tablet Take 1,000 Units by mouth daily.  . DOCOSAHEXAENOIC ACID PO Take 1,000 mg by mouth daily.   Marland Kitchen donepezil (ARICEPT) 10 MG tablet Take 10 mg by mouth daily.  Marland Kitchen donepezil (ARICEPT) 5 MG tablet Take 5 mg by mouth at bedtime.  Marland Kitchen HYDROcodone-acetaminophen (NORCO) 5-325 MG tablet Take 1-2 tablets by mouth every 4 (four) hours as needed for moderate pain.  . NON FORMULARY Take 1 tablet by mouth 2 (two) times daily. Shaklee herbal supplement  . omega-3 acid ethyl esters (LOVAZA) 1 g capsule Take 1 g by mouth 2 (two) times daily.  . sertraline (ZOLOFT) 25 MG tablet Take 25 mg by mouth daily.  . sertraline (ZOLOFT) 50 MG tablet Take 50 mg by mouth daily.  Marland Kitchen thyroid (ARMOUR) 60 MG tablet Take 60 mg by mouth daily before breakfast.  . Zinc 50 MG TABS Take 1 tablet by mouth daily.   No facility-administered encounter medications on file as of 11/01/2016.    Xanax PRN is prescribed but she is not using it at all.   Current Examination:  Behavioral Observations:   Appearance: Neatly and appropriately dressed and groomed Gait: Ambulated with a cane, no gross abnormalities observed Speech: Fluent; normal rate, rhythm and volume. No significant word finding  difficulty. Repeats herself frequently due to memory loss. Very talkative. Thought process: Appears linear Affect: Full, generally euthymic; does become tearful when talking about her husband Interpersonal: Pleasant, appropriate Orientation: Oriented to person and current city only. Disoriented to time including current age (one year off), month, date, year, and day of the week. Could not name current President or his predecessor.   Tests Administered: . Test of Premorbid Functioning (TOPF) . Dementia Rating Scale - Second edition (DRS-2) . Wechsler Adult Intelligence Scale-Fourth Edition (WAIS-IV): Coding subtest . [place] Verbal Learning Test - 2nd Edition (CVLT-2) Short Form . Repeatable Battery for the Assessment of Neuropsychological Status (RBANS) Form A:  Figure Copy and Recall subtests  . Neuropsychological Assessment Battery  (NAB) Language Module, Form 1: Naming Subtest . Boston Diagnostic Aphasia Examination: Complex Ideational Material subtest . Controlled Oral Word Association Test (COWAT) . Trail Making Test A and B . Clock drawing test . Geriatric Depression Scale (GDS) 15 Item . Generalized Anxiety Disorder - 7 item screener (GAD-7)  Test Results: Note: Standardized scores are presented only for use by appropriately trained professionals and to allow for any future test-retest comparison. These scores should not be interpreted without consideration of all the information that is contained in the rest of the report. The most recent standardization samples from the test publisher or other sources were used whenever possible to derive standard scores; scores were corrected for age, gender, ethnicity and education when available.   Test Scores:  Test Name Raw Score Standardized Score Descriptor  TOPF 43/70 SS= 103 Average  DRS-2     Attention 36 ss= 12 High average  Initiation/Perseveration 34 ss= 9 Average  Construction 6 ss= 10 Average  Conceptualization 38 ss= 13 High average  Memory 13 ss= 2 Impaired  Total score 127 ss= 8 Low end of average  Total score - Education Adjusted (AEMSS)  ss= 8 Low end of average  WAIS-IV Subtest     Coding 38/135 ss= 11 Average  RBANS Subtests     Figure Copy 12/20 Z= -2.7 Severely impaired  Figure Recall 3/20 Z= -2.1 Impaired  CVLT-II Scores     Trial 1 2/9 Z= -3 Severely impaired  Trial 4 5/9 Z= -1.5 Borderline  Trials 1-4 total 12/36 T= 20 Severely impaired  SD Free Recall 1/9 Z= -2.5 Impaired  LD Free Recall 1/9 Z= -2 Impaired  LD Cued Recall 1/9 Z= -3 Severely impaired  Recognition Discriminability 6/9 hits; 5 false positives Z= -1.5 Borderline  Forced Choice Recognition 9/9  WNL  NAB Naming 29/31 T= 53 Average  BDAE Subtest     Complex Ideational Material 6/6  WNL  COWAT-FAS 32 T= 46 Average  COWAT-Animals 19 T= 56 Average  Trail Making Test A  49" 0  errors T= 55 Average  Trail Making Test B  Pt unable    Clock Drawing   Impaired  GDS-15 1/15  WNL   GAD-7 0/21  WNL       Description of Test Results:  Premorbid verbal intellectual abilities were estimated to have been within the average range based on a test of word reading. Psychomotor processing speed was average. Basic attention was high average. Basic visual-spatial construction was average, while her drawn copy of a complex geometric figure was severely impaired. Language abilities were intact. Specifically, confrontation naming was average, and semantic verbal fluency was average. Auditory comprehension of complex ideational material was intact. On the memory scale of the DRS-2, she performed within the impaired range.  On a more robust measure of verbal memory (CVLT), encoding and acquisition of non-contextual information (i.e., word list) was severely impaired. After a brief distracter task, free recall was impaired (1/9 items recalled). After a delay, free recall was impaired (1/9 items recalled). Cued recall was severely impaired (1/9 items recalled). Performance on a yes/no recognition task was impaired. Executive functioning was variable. Mental flexibility and set-shifting were impaired; she was unable to complete Trails B. Verbal fluency with phonemic search restrictions was average. Verbal abstract reasoning was high average. Performance on tests of initiation and perseveration was average. Performance on a clock drawing task was impaired. On a self-report measures of mood, the patient's responses were not indicative of clinically significant depression or anxiety at the present time.    Clinical Impressions: Mild dementia most likely secondary to Alzheimer's disease. Results of the current cognitive evaluation reveal impairment in learning and memory, as well as in executive functioning. Other aspects of cognitive functioning assessed, including processing speed, attention, visual  spatial skills, and language were all intact. There is evidence that her cognitive deficits are interfering with her ability to manage complex tasks, such as managing finances, appointments and medications. As such, diagnostic criteria for a dementia syndrome are met.  The patient's cognitive profile is suggestive of medial-temporal lobe involvement. Alzheimer's disease is the most likely etiology, given her cognitive profile and clinical features. I would characterize her dementia as mild stage. Fortunately, there is no evidence of mood disturbance or primary psychiatric disorder.    Recommendations: Based on the findings of the present evaluation, the following recommendations are offered:  1. The patient is considered an appropriate candidate for cholinesterase inhibitor therapy. She is already taking Aricept. 2. The patient should continue to receive assistance with complex ADLs, including finances, medications, appointments and transportation. Given her poor performance on a cognitive test highly correlated with driving ability, it is my recommendation that she not return to driving.   3. Education regarding Alzheimer's disease was provided to the patient and her daughter. The patient's family may wish to seek additional information and resources from the Alzheimer's Association ([employer].co.nz) and Tax adviser (http://www.senior-resources-[place].org/). 4. The patient should continue to participate in activities which provide mental stimulation, safe cardiovascular exercise, and social interaction. 5. Neuropsychological re-assessment in one year could be considered in order to monitor cognitive status, track progression of symptoms and further assist with treatment planning.   Feedback to Patient: Sonya Carey and her daughter returned for a feedback appointment on 11/01/2016 to review the results of her neuropsychological evaluation with this provider. 45 minutes face-to-face  time was spent reviewing her test results, my impressions and my recommendations as detailed above.    Total time spent on this patient's case: [id number] unit for interview with psychologist; [phone] units of testing by psychometrician under psychologist's supervision; [phone] units for medical record review, scoring of neuropsychological tests, interpretation of test results, preparation of this report, and review of results to the patient by psychologist.      Thank you for your referral of [facility]. Please feel free to contact me if you have any questions or concerns regarding this report.

## 2016-11-01 ENCOUNTER — Ambulatory Visit (INDEPENDENT_AMBULATORY_CARE_PROVIDER_SITE_OTHER): Payer: Medicare Other | Admitting: Psychology

## 2016-11-01 ENCOUNTER — Encounter: Payer: Self-pay | Admitting: Psychology

## 2016-11-01 DIAGNOSIS — G301 Alzheimer's disease with late onset: Secondary | ICD-10-CM

## 2016-11-01 DIAGNOSIS — F0280 Dementia in other diseases classified elsewhere without behavioral disturbance: Secondary | ICD-10-CM | POA: Insufficient documentation

## 2016-11-04 ENCOUNTER — Encounter
Admission: RE | Admit: 2016-11-04 | Discharge: 2016-11-04 | Disposition: A | Discharge status: Home / Self Care | Payer: Medicare Other | Source: Ambulatory Visit | Attending: Orthopedic Surgery | Admitting: Orthopedic Surgery

## 2016-11-04 ENCOUNTER — Telehealth: Payer: Self-pay | Admitting: Psychology

## 2016-11-04 DIAGNOSIS — M67431 Ganglion, right wrist: Secondary | ICD-10-CM | POA: Insufficient documentation

## 2016-11-04 DIAGNOSIS — I10 Essential (primary) hypertension: Secondary | ICD-10-CM | POA: Diagnosis not present

## 2016-11-04 DIAGNOSIS — Z01818 Encounter for other preprocedural examination: Secondary | ICD-10-CM | POA: Insufficient documentation

## 2016-11-04 NOTE — Patient Instructions (Signed)
Your procedure is scheduled on: Wednesday 11/10/16 Report to [facility]. 2ND FLOOR MEDICAL MALL ENTRANCE. To find out your arrival time please call [phone] between 1PM - 3PM on Tuesday 11/09/17.  Remember: Instructions that are not followed completely may result in serious medical risk, up to and including death, or upon the discretion of your surgeon and anesthesiologist your surgery may need to be rescheduled.    __X__ 1. Do not eat food or drink liquids after midnight. No gum chewing or hard candies.     __X__ 2. No Alcohol for 24 hours before or after surgery.   ____ 3. Bring all medications with you on the day of surgery if instructed.    __X__ 4. Notify your doctor if there is any change in your medical condition     (cold, fever, infections).             __x___5. No smoking within 24 hours of your surgery.     Do not wear jewelry, make-up, hairpins, clips or nail polish.  Do not wear lotions, powders, or perfumes.   Do not shave 48 hours prior to surgery. Men may shave face and neck.  Do not bring valuables to the hospital.    [facility] is not responsible for any belongings or valuables.               Contacts, dentures or bridgework may not be worn into surgery.  Leave your suitcase in the car. After surgery it may be brought to your room.  For patients admitted to the hospital, discharge time is determined by your                treatment team.   Patients discharged the day of surgery will not be allowed to drive home.   Please read over the following fact sheets that you were given:   MRSA Information   __x__ Take these medicines the morning of surgery with A SIP OF WATER:    1. amlodipine  2. donepezil  3. sertraline  4. Thyroid (armour)  5. Cetirizine if needed  6.  ____ Fleet Enema (as directed)   __x__ Use CHG Soap as directed  ____ Use inhalers on the day of surgery  ____ Stop metformin 2 days prior to surgery    ____ Take 1/2 of usual insulin  dose the night before surgery and none on the morning of surgery.   __x__ Stop Coumadin/Plavix/aspirin on today  __X__ Stop Anti-inflammatories such as Advil, Aleve, Ibuprofen, Motrin, Naproxen, Naprosyn, Goodies,powder, or aspirin products.  OK to take Tylenol.   ____ Stop supplements until after surgery.    ____ Bring C-Pap to the hospital.

## 2016-11-04 NOTE — Telephone Encounter (Signed)
Hi Dr. Alysia Penna the daughter of Sonya Carey called to see if you could please e mail her a copy of her mother's results from her last appointment?  She said she had a copy of it but also needs one e mailed to her ([email or web address])  Thanks

## 2016-11-04 NOTE — Telephone Encounter (Signed)
Unfortunately I'm not able to email a copy of the report. I can fax a copy if she has a fax number, or we can mail another copy out if that helps.

## 2016-11-05 NOTE — Telephone Encounter (Signed)
Okay thank you, I will let her know!

## 2016-11-06 LAB — LATEX, IGE: Latex: <0.1 kU/L

## 2016-11-10 ENCOUNTER — Ambulatory Visit: Payer: Medicare Other | Admitting: Anesthesiology

## 2016-11-10 ENCOUNTER — Encounter
Admission: RE | Disposition: A | Discharge status: Home / Self Care | Payer: Self-pay | Source: Ambulatory Visit | Attending: Orthopedic Surgery

## 2016-11-10 ENCOUNTER — Ambulatory Visit
Admission: RE | Admit: 2016-11-10 | Discharge: 2016-11-10 | Disposition: A | Discharge status: Home / Self Care | Payer: Medicare Other | Source: Ambulatory Visit | Attending: Orthopedic Surgery | Admitting: Orthopedic Surgery

## 2016-11-10 ENCOUNTER — Encounter: Payer: Self-pay | Admitting: Orthopedic Surgery

## 2016-11-10 DIAGNOSIS — Z9049 Acquired absence of other specified parts of digestive tract: Secondary | ICD-10-CM | POA: Diagnosis not present

## 2016-11-10 DIAGNOSIS — Z800 Family history of malignant neoplasm of digestive organs: Secondary | ICD-10-CM | POA: Diagnosis not present

## 2016-11-10 DIAGNOSIS — M25511 Pain in right shoulder: Secondary | ICD-10-CM | POA: Diagnosis not present

## 2016-11-10 DIAGNOSIS — K5900 Constipation, unspecified: Secondary | ICD-10-CM | POA: Diagnosis not present

## 2016-11-10 DIAGNOSIS — Z91040 Latex allergy status: Secondary | ICD-10-CM | POA: Insufficient documentation

## 2016-11-10 DIAGNOSIS — M67431 Ganglion, right wrist: Secondary | ICD-10-CM | POA: Insufficient documentation

## 2016-11-10 DIAGNOSIS — Z85038 Personal history of other malignant neoplasm of large intestine: Secondary | ICD-10-CM | POA: Diagnosis not present

## 2016-11-10 DIAGNOSIS — M25512 Pain in left shoulder: Secondary | ICD-10-CM | POA: Diagnosis not present

## 2016-11-10 DIAGNOSIS — E039 Hypothyroidism, unspecified: Secondary | ICD-10-CM | POA: Diagnosis not present

## 2016-11-10 DIAGNOSIS — Z91048 Other nonmedicinal substance allergy status: Secondary | ICD-10-CM | POA: Diagnosis not present

## 2016-11-10 DIAGNOSIS — Z823 Family history of stroke: Secondary | ICD-10-CM | POA: Insufficient documentation

## 2016-11-10 DIAGNOSIS — Z8262 Family history of osteoporosis: Secondary | ICD-10-CM | POA: Diagnosis not present

## 2016-11-10 DIAGNOSIS — Z888 Allergy status to other drugs, medicaments and biological substances status: Secondary | ICD-10-CM | POA: Insufficient documentation

## 2016-11-10 DIAGNOSIS — Z79899 Other long term (current) drug therapy: Secondary | ICD-10-CM | POA: Insufficient documentation

## 2016-11-10 HISTORY — PX: GANGLION CYST EXCISION: SHX1691

## 2016-11-10 SURGERY — EXCISION, GANGLION CYST, WRIST
Anesthesia: General | Laterality: Right

## 2016-11-10 MED ORDER — FAMOTIDINE 20 MG PO TABS
ORAL_TABLET | ORAL | Status: AC
  Filled 2016-11-10: qty 1

## 2016-11-10 MED ORDER — ACETAMINOPHEN 325 MG PO TABS
ORAL_TABLET | ORAL | Status: AC
  Filled 2016-11-10: qty 2

## 2016-11-10 MED ORDER — LACTATED RINGERS IV SOLN
INTRAVENOUS | Status: DC
  Administered 2016-11-10: 11:00:00 via INTRAVENOUS

## 2016-11-10 MED ORDER — ONDANSETRON HCL 4 MG PO TABS: 4.0000 mg | ORAL_TABLET | Freq: Four times a day (QID) | ORAL | Status: DC | PRN

## 2016-11-10 MED ORDER — BUPIVACAINE HCL (PF) 0.25 % IJ SOLN
INTRAMUSCULAR | Status: DC | PRN
  Administered 2016-11-10: 2 mL

## 2016-11-10 MED ORDER — FAMOTIDINE 20 MG PO TABS
20.0000 mg | ORAL_TABLET | Freq: Once | ORAL | Status: AC
  Administered 2016-11-10: 20 mg via ORAL

## 2016-11-10 MED ORDER — LIDOCAINE HCL (CARDIAC) 20 MG/ML IV SOLN
INTRAVENOUS | Status: DC | PRN
  Administered 2016-11-10: 100 mg via INTRAVENOUS

## 2016-11-10 MED ORDER — FENTANYL CITRATE (PF) 100 MCG/2ML IJ SOLN: 25.0000 ug | INTRAMUSCULAR | Status: DC | PRN

## 2016-11-10 MED ORDER — HYDROCODONE-ACETAMINOPHEN 5-325 MG PO TABS: 1.0000 | ORAL_TABLET | ORAL | 0 refills | Status: DC | PRN

## 2016-11-10 MED ORDER — FENTANYL CITRATE (PF) 100 MCG/2ML IJ SOLN
INTRAMUSCULAR | Status: DC | PRN
  Administered 2016-11-10: 100 ug via INTRAVENOUS

## 2016-11-10 MED ORDER — ACETAMINOPHEN 325 MG PO TABS
650.0000 mg | ORAL_TABLET | Freq: Once | ORAL | Status: AC
Start: 2016-11-10 — End: 2016-11-10
  Administered 2016-11-10: 650 mg via ORAL

## 2016-11-10 MED ORDER — PROPOFOL 10 MG/ML IV BOLUS
INTRAVENOUS | Status: DC | PRN
Start: 2016-11-10 — End: 2016-11-10
  Administered 2016-11-10: 100 mg via INTRAVENOUS

## 2016-11-10 MED ORDER — SUCCINYLCHOLINE CHLORIDE 200 MG/10ML IV SOSY
PREFILLED_SYRINGE | INTRAVENOUS | Status: DC | PRN
  Administered 2016-11-10: 20 mg via INTRAVENOUS

## 2016-11-10 MED ORDER — METOCLOPRAMIDE HCL 5 MG/ML IJ SOLN: 5.0000 mg | Freq: Three times a day (TID) | INTRAMUSCULAR | Status: DC | PRN

## 2016-11-10 MED ORDER — PROPOFOL 10 MG/ML IV BOLUS
INTRAVENOUS | Status: AC
  Filled 2016-11-10: qty 20

## 2016-11-10 MED ORDER — LIDOCAINE HCL (PF) 2 % IJ SOLN
INTRAMUSCULAR | Status: AC
  Filled 2016-11-10: qty 2

## 2016-11-10 MED ORDER — ONDANSETRON HCL 4 MG/2ML IJ SOLN: 4.0000 mg | Freq: Once | INTRAMUSCULAR | Status: DC | PRN

## 2016-11-10 MED ORDER — ONDANSETRON HCL 4 MG/2ML IJ SOLN: 4.0000 mg | Freq: Four times a day (QID) | INTRAMUSCULAR | Status: DC | PRN

## 2016-11-10 MED ORDER — METOCLOPRAMIDE HCL 10 MG PO TABS: 5.0000 mg | ORAL_TABLET | Freq: Three times a day (TID) | ORAL | Status: DC | PRN

## 2016-11-10 MED ORDER — BUPIVACAINE HCL (PF) 0.25 % IJ SOLN
INTRAMUSCULAR | Status: AC
  Filled 2016-11-10: qty 30

## 2016-11-10 MED ORDER — CHLORHEXIDINE GLUCONATE 4 % EX LIQD: 60.0000 mL | Freq: Once | CUTANEOUS | Status: DC

## 2016-11-10 MED ORDER — FENTANYL CITRATE (PF) 100 MCG/2ML IJ SOLN
INTRAMUSCULAR | Status: AC
  Filled 2016-11-10: qty 2

## 2016-11-10 MED ORDER — PROPOFOL 500 MG/50ML IV EMUL
INTRAVENOUS | Status: DC | PRN
  Administered 2016-11-10: 100 ug/kg/min via INTRAVENOUS

## 2016-11-10 SURGICAL SUPPLY — 33 items
BNDG ESMARK 4X12 TAN STRL LF (GAUZE/BANDAGES/DRESSINGS) ×3 IMPLANT
BNDG STRETCH 4X75 STRL LF (GAUZE/BANDAGES/DRESSINGS) IMPLANT
CANISTER SUCT 1200ML W/VALVE (MISCELLANEOUS) ×3 IMPLANT
CLOSURE WOUND 1/2 X4 (GAUZE/BANDAGES/DRESSINGS)
CUFF TOURN 18 STER (MISCELLANEOUS) ×3 IMPLANT
DERMABOND ADVANCED (GAUZE/BANDAGES/DRESSINGS) ×2
DERMABOND ADVANCED .7 DNX12 (GAUZE/BANDAGES/DRESSINGS) ×1 IMPLANT
DRAPE INCISE 23X17 IOBAN STRL (DRAPES) ×2
DRAPE INCISE IOBAN 23X17 STRL (DRAPES) ×1 IMPLANT
DRSG DERMACEA 8X12 NADH (GAUZE/BANDAGES/DRESSINGS) IMPLANT
DURAPREP 26ML APPLICATOR (WOUND CARE) ×3 IMPLANT
ELECT REM PT RETURN 9FT ADLT (ELECTROSURGICAL) ×3
ELECTRODE REM PT RTRN 9FT ADLT (ELECTROSURGICAL) ×1 IMPLANT
GAUZE SPONGE 4X4 12PLY STRL (GAUZE/BANDAGES/DRESSINGS) ×3 IMPLANT
GLOVE BIO SURGEON STRL SZ8 (GLOVE) ×12 IMPLANT
GLOVE BIOGEL M STRL SZ7.5 (GLOVE) ×3 IMPLANT
GOWN STRL REUS W/ TWL LRG LVL3 (GOWN DISPOSABLE) ×2 IMPLANT
GOWN STRL REUS W/TWL LRG LVL3 (GOWN DISPOSABLE) ×4
KIT RM TURNOVER STRD PROC AR (KITS) ×3 IMPLANT
NS IRRIG 500ML POUR BTL (IV SOLUTION) ×3 IMPLANT
PACK EXTREMITY ARMC (MISCELLANEOUS) ×3 IMPLANT
PAD CAST CTTN 4X4 STRL (SOFTGOODS) ×1 IMPLANT
PADDING CAST COTTON 4X4 STRL (SOFTGOODS) ×2
SOL PREP PVP 2OZ (MISCELLANEOUS) ×3
SOLUTION PREP PVP 2OZ (MISCELLANEOUS) ×1 IMPLANT
SPLINT CAST 1 STEP 3X12 (MISCELLANEOUS) ×3 IMPLANT
SPLINT WRIST M RT TX990303 (SOFTGOODS) ×3 IMPLANT
STOCKINETTE BIAS CUT 4 980044 (GAUZE/BANDAGES/DRESSINGS) ×3 IMPLANT
STRIP CLOSURE SKIN 1/2X4 (GAUZE/BANDAGES/DRESSINGS) IMPLANT
SUT VIC AB 2-0 SH 27 (SUTURE)
SUT VIC AB 2-0 SH 27XBRD (SUTURE) IMPLANT
SUT VIC AB 3-0 PS2 18 (SUTURE) IMPLANT
SUT VIC AB 4-0 PS2 18 (SUTURE) ×3 IMPLANT

## 2016-11-10 NOTE — Anesthesia Preprocedure Evaluation (Signed)
Anesthesia Evaluation  Patient identified by MRN, date of birth, ID band Patient awake    Reviewed: Allergy & Precautions, NPO status , Patient's Chart, lab work & pertinent test results  History of Anesthesia Complications Negative for: history of anesthetic complications  Airway Mallampati: II       Dental  (+) Upper Dentures, Lower Dentures   Pulmonary neg sleep apnea, neg COPD, former smoker,           Cardiovascular hypertension, Pt. on medications      Neuro/Psych neg Seizures Depression    GI/Hepatic Neg liver ROS, neg GERD  ,  Endo/Other  neg diabetesHypothyroidism   Renal/GU negative Renal ROS     Musculoskeletal   Abdominal   Peds  Hematology   Anesthesia Other Findings   Reproductive/Obstetrics                            Anesthesia Physical Anesthesia Plan  ASA: II  Anesthesia Plan: General   Post-op Pain Management:    Induction:   PONV Risk Score and Plan:   Airway Management Planned:   Additional Equipment:   Intra-op Plan:   Post-operative Plan:   Informed Consent: I have reviewed the patients History and Physical, chart, labs and discussed the procedure including the risks, benefits and alternatives for the proposed anesthesia with the patient or authorized representative who has indicated his/her understanding and acceptance.     Plan Discussed with:   Anesthesia Plan Comments:         Anesthesia Quick Evaluation

## 2016-11-10 NOTE — Transfer of Care (Signed)
Immediate Anesthesia Transfer of Care Note  Patient: Sonya Carey  Procedure(s) Performed: Procedure(s): REMOVAL GANGLION OF WRIST (Right)  Patient Location: PACU  Anesthesia Type:General  Level of Consciousness: awake, alert , oriented and patient cooperative  Airway & Oxygen Therapy: Patient Spontanous Breathing and Patient connected to nasal cannula oxygen  Post-op Assessment: Report given to RN and Post -op Vital signs reviewed and stable  Post vital signs: Reviewed and stable  Last Vitals:  Vitals:   11/10/16 1014  BP: 138/77  Pulse: 79  Resp: 17  Temp: 36.7 C  SpO2: 97%    Last Pain:  Vitals:   11/10/16 1014  TempSrc: Oral         Complications: No apparent anesthesia complications

## 2016-11-10 NOTE — Op Note (Signed)
OPERATIVE NOTE  DATE OF SURGERY: 11/10/2016  PATIENT NAME:  Sonya Carey   DOB: 1924-10-14  MRN: [id number]   PRE-OPERATIVE DIAGNOSIS:  Recurrent dorsal ganglion cyst of the right wrist  POST-OPERATIVE DIAGNOSIS:  Same  PROCEDURE:  Excision of recurrent dorsal ganglion cyst from the right wrist  SURGEON:  Marciano Sequin., M.D.          ASSISTANT: None  ANESTHESIA: general  ESTIMATED BLOOD LOSS: None  FLUIDS REPLACED: 650 mL of crystalloid  TOURNIQUET TIME: 28 minutes   DRAINS: None  IMPLANTS UTILIZED: None  INDICATIONS FOR SURGERY: Sonya Carey is a 83 y.o. year old female who has been seen for complaints of a cystic swelling to the dorsum of the right wrist. Approximately 3 months ago she underwent excision of a ganglion cyst, but has had recurrence of the ganglion cyst.. After discussion of the risks and benefits of surgical intervention, the patient expressed understanding of the risks benefits and agree with plans for excision of the recurrent ganglion cyst from the right wrist.   PROCEDURE IN DETAIL: The patient was brought into the operating room and, after adequate general anesthesia was achieved, tourniquet was placed on patient's upper right arm. The patient's right hand and arm were cleaned and prepped with alcohol and DuraPrep drape usual sterile fashion. A "timeout" was performed as per usual protocol. Loupe magnification was used throughout the procedure. The right upper extremity was exsanguinated using an Esmarch, the tourniquet was inflated to 250 mmHg. A transverse incision was made directly over the palpable mass. Dissection was carried down around the cystic lesion using tenotomy scissors. The cyst was excised and measured approximately 1-1/2 to 2 cm. The specimen was submitted to pathology. The floor of the wound was then reinforced using #4-0 Vicryl. The wound was irrigated with copious amounts of normal saline solution. The skin edges  were closed in a subcuticular fashion using #4-0 Vicryl. Dermabond was applied to the skin edges..Tourniquet was deflated after total tourniquet time of 28 minutes. 2 mm of 0.25% Marcaine was injected along the incision site. A sterile dressing was applied followed by application of a fiberglass wrist splint.  The patient tolerated the procedure well. She was transported to the recovery room in stable condition.  Sonya Carey M.D.

## 2016-11-10 NOTE — Anesthesia Postprocedure Evaluation (Signed)
Anesthesia Post Note  Patient: Sonya Carey  Procedure(s) Performed: Procedure(s) (LRB): REMOVAL GANGLION OF WRIST (Right)  Patient location during evaluation: PACU Anesthesia Type: General Level of consciousness: awake and alert Pain management: pain level controlled Vital Signs Assessment: post-procedure vital signs reviewed and stable Respiratory status: spontaneous breathing and respiratory function stable Cardiovascular status: stable Anesthetic complications: no     Last Vitals:  Vitals:   11/10/16 1244 11/10/16 1256  BP: 127/78 140/61  Pulse: 69 67  Resp: 12 14  Temp: 37.2 C   SpO2: 100% 99%    Last Pain:  Vitals:   11/10/16 1014  TempSrc: Oral                 Kolten Ryback K

## 2016-11-10 NOTE — Discharge Instructions (Signed)
AMBULATORY SURGERY  °DISCHARGE INSTRUCTIONS ° ° °1) The drugs that you were given will stay in your system until tomorrow so for the next 24 hours you should not: ° °A) Drive an automobile °B) Make any legal decisions °C) Drink any alcoholic beverage ° ° °2) You may resume regular meals tomorrow.  Today it is better to start with liquids and gradually work up to solid foods. ° °You may eat anything you prefer, but it is better to start with liquids, then soup and crackers, and gradually work up to solid foods. ° ° °3) Please notify your doctor immediately if you have any unusual bleeding, trouble breathing, redness and pain at the surgery site, drainage, fever, or pain not relieved by medication. °4)  ° °5) Your post-operative visit with Dr.                     °           °     is: Date:                        Time:   ° °Please call to schedule your post-operative visit. ° °6) Additional Instructions: ° ° ° ° ° °Instructions after Hand / Wrist Surgery ° ° James P. Hooten, Jr., M.D. ° Dept. of Orthopaedics & Sports Medicine ° [facility] ° [address] ° [place], [place]  [address] ° ° Phone: [phone]   Fax: [phone] ° ° °DIET: °• Drink plenty of non-alcoholic fluids & begin a light diet. °• Resume your normal diet the day after surgery. ° °ACTIVITY:  °• Keep the hand elevated above the level of the elbow. °• Begin gently moving the fingers on a regular basis to avoid stiffness. °• Avoid any heavy lifting, pushing, or pulling with the operative hand. °• Do not drive or operate any equipment until instructed. ° °WOUND CARE:  °• Keep the splint/bandage clean and dry.  °• The splint and stitches will be removed in the office. °• Continue to use the ice packs periodically to reduce pain and swelling. °• You may bathe or shower after the stitches are removed at the first office visit following surgery. ° °MEDICATIONS: °• You may resume your regular medications. °• Please take the pain medication as  prescribed. °• Do not take pain medication on an empty stomach. °• Do not drive or drink alcoholic beverages when taking pain medications. ° °CALL THE OFFICE FOR: °• Temperature above 101 degrees °• Excessive bleeding or drainage on the dressing. °• Excessive swelling, coldness, or paleness of the fingers. °• Persistent nausea and vomiting. ° °FOLLOW-UP:  °• You should have an appointment to return to the office in 7-10 days after surgery.  ° °REMEMBER: R.I.C.E. = Rest, Ice, Compression, Elevation !  °

## 2016-11-10 NOTE — Anesthesia Post-op Follow-up Note (Signed)
Anesthesia QCDR form completed.        

## 2016-11-10 NOTE — Anesthesia Procedure Notes (Signed)
Procedure Name: LMA Insertion Date/Time: 11/10/2016 11:42 AM Performed by: Rosaria Ferries, Shantice Menger Pre-anesthesia Checklist: Patient identified, Emergency Drugs available, Suction available and Patient being monitored Patient Re-evaluated:Patient Re-evaluated prior to induction Oxygen Delivery Method: Circle system utilized Preoxygenation: Pre-oxygenation with 100% oxygen Induction Type: IV induction LMA Size: 3.5 Placement Confirmation: positive ETCO2 and breath sounds checked- equal and bilateral Tube secured with: Tape Dental Injury: Teeth and Oropharynx as per pre-operative assessment

## 2016-11-10 NOTE — H&P (Signed)
The patient has been re-examined, and the chart reviewed, and there have been no interval changes to the documented history and physical.    The risks, benefits, and alternatives have been discussed at length. The patient expressed understanding of the risks benefits and agreed with plans for surgical intervention.  James P. Hooten, Jr. M.D.    

## 2016-11-11 ENCOUNTER — Encounter: Payer: Self-pay | Admitting: Orthopedic Surgery

## 2016-11-11 LAB — SURGICAL PATHOLOGY

## 2017-11-11 ENCOUNTER — Emergency Department: Payer: Medicare Other

## 2017-11-11 ENCOUNTER — Emergency Department
Admission: EM | Admit: 2017-11-11 | Discharge: 2017-11-11 | Disposition: A | Discharge status: Home / Self Care | Payer: Medicare Other | Attending: Emergency Medicine | Admitting: Emergency Medicine

## 2017-11-11 DIAGNOSIS — Z87891 Personal history of nicotine dependence: Secondary | ICD-10-CM | POA: Diagnosis not present

## 2017-11-11 DIAGNOSIS — W07XXXA Fall from chair, initial encounter: Secondary | ICD-10-CM | POA: Diagnosis not present

## 2017-11-11 DIAGNOSIS — Y999 Unspecified external cause status: Secondary | ICD-10-CM | POA: Diagnosis not present

## 2017-11-11 DIAGNOSIS — S0083XA Contusion of other part of head, initial encounter: Secondary | ICD-10-CM | POA: Diagnosis not present

## 2017-11-11 DIAGNOSIS — S098XXA Other specified injuries of head, initial encounter: Secondary | ICD-10-CM | POA: Diagnosis present

## 2017-11-11 DIAGNOSIS — W19XXXA Unspecified fall, initial encounter: Secondary | ICD-10-CM

## 2017-11-11 DIAGNOSIS — S0990XA Unspecified injury of head, initial encounter: Secondary | ICD-10-CM

## 2017-11-11 DIAGNOSIS — E039 Hypothyroidism, unspecified: Secondary | ICD-10-CM | POA: Diagnosis not present

## 2017-11-11 DIAGNOSIS — Y9389 Activity, other specified: Secondary | ICD-10-CM | POA: Diagnosis not present

## 2017-11-11 DIAGNOSIS — I10 Essential (primary) hypertension: Secondary | ICD-10-CM | POA: Diagnosis not present

## 2017-11-11 DIAGNOSIS — Y9289 Other specified places as the place of occurrence of the external cause: Secondary | ICD-10-CM | POA: Diagnosis not present

## 2017-11-11 LAB — COMPREHENSIVE METABOLIC PANEL
ALBUMIN: 3.6 g/dL (ref 3.5–5.0)
ALK PHOS: 81 U/L (ref 38–126)
ALT: 15 U/L (ref 0–44)
ANION GAP: 10 (ref 5–15)
AST: 27 U/L (ref 15–41)
BILIRUBIN TOTAL: 0.6 mg/dL (ref 0.3–1.2)
BUN: 11 mg/dL (ref 8–23)
CALCIUM: 9.3 mg/dL (ref 8.9–10.3)
CO2: 29 mmol/L (ref 22–32)
Chloride: 95 mmol/L — ABNORMAL LOW (ref 98–111)
Creatinine, Ser: 0.66 mg/dL (ref 0.44–1.00)
GFR calc non Af Amer: >60 mL/min (ref >60)
Glucose, Bld: 118 mg/dL — ABNORMAL HIGH (ref 70–99)
POTASSIUM: 4.1 mmol/L (ref 3.5–5.1)
Sodium: 134 mmol/L — ABNORMAL LOW (ref 135–145)
TOTAL PROTEIN: 6.8 g/dL (ref 6.5–8.1)

## 2017-11-11 LAB — CBC WITH DIFFERENTIAL/PLATELET
BASOS ABS: 0 10*3/uL (ref 0–0.1)
BASOS PCT: 1 %
Eosinophils Absolute: 0.1 10*3/uL (ref 0–0.7)
Eosinophils Relative: 2 %
HEMATOCRIT: 33.9 % — AB (ref 35.0–47.0)
Hemoglobin: 12.2 g/dL (ref 12.0–16.0)
Lymphocytes Relative: 19 %
Lymphs Abs: 1.2 10*3/uL (ref 1.0–3.6)
MCH: 31.8 pg (ref 26.0–34.0)
MCHC: 36 g/dL (ref 32.0–36.0)
MCV: 88.4 fL (ref 80.0–100.0)
MONO ABS: 0.5 10*3/uL (ref 0.2–0.9)
Monocytes Relative: 7 %
NEUTROS ABS: 4.6 10*3/uL (ref 1.4–6.5)
Neutrophils Relative %: 71 %
PLATELETS: 228 10*3/uL (ref 150–440)
RBC: 3.84 MIL/uL (ref 3.80–5.20)
RDW: 14.8 % — AB (ref 11.5–14.5)
WBC: 6.4 10*3/uL (ref 3.6–11.0)

## 2017-11-11 LAB — TROPONIN I

## 2017-11-11 MED ORDER — ONDANSETRON 4 MG PO TBDP
4.0000 mg | ORAL_TABLET | Freq: Once | ORAL | Status: AC
  Administered 2017-11-11: 4 mg via ORAL
  Filled 2017-11-11: qty 1

## 2017-11-11 NOTE — ED Notes (Signed)
Upon discharge while waiting to get into vehicle pt became more dizzy than at other times today. Pt assisted into vehicle by this nurse. Family adamant about not taking the pt home like this, offered to monitor patient back in her room but per Dr. Jimmye Norman no additional testing would be needed but the patient could stay and be monitored. Pts son preferred to just get pt home and allow her to rest there. Pt buckled into front seat by this nurse and given emesis bag as pt reported nausea. Dr. Jimmye Norman made aware.

## 2017-11-11 NOTE — ED Triage Notes (Signed)
Pt a resident at [facility]. Pt was at hair salon when she fell out of her chair. Unsure if LOC. Pt reports nausea and dizziness now.  Bruise to R FA and R chin.

## 2017-11-11 NOTE — ED Notes (Signed)
Pt provided with ice pack per sons request.

## 2017-11-11 NOTE — ED Provider Notes (Signed)
[facility] Emergency Department Provider Note       Time seen: ----------------------------------------- 12:03 PM on 11/11/2017 -----------------------------------------   I have reviewed the triage vital signs and the nursing notes.  HISTORY   Chief Complaint Fall and Dizziness    HPI Sonya Carey is a 84 y.o. female with a history of colon cancer, depression, hypertension, thyroid disease who presents to the ED for a fall.  Patient was at the hair salon when she fell out of her chair.  She is unsure if she lost consciousness, currently complaining of nausea and dizziness.  Bruises were noted to her right arm and to her chin.  She denies any recent illness or change in her medications.  Past Medical History:  Diagnosis Date  . Colon cancer ([place])    adenocarcinoma of transverse colon  . Depression   . Fracture, metacarpal 06/2016   5th metacarpal  . History of chicken pox   . Hypertension   . Hypothyroidism   . Macular degeneration    left eye  . Thyroid disease     There are no active problems to display for this patient.   Past Surgical History:  Procedure Laterality Date  . CARPAL TUNNEL RELEASE  12/09/2010  . CHOLECYSTECTOMY  1995  . COLON SURGERY  02/25/2010   transverse colon resection  . COLONOSCOPY    . ESOPHAGOGASTRODUODENOSCOPY  02/25/2010  . GANGLION CYST EXCISION Right 07/26/2016   Procedure: REMOVAL GANGLION OF WRIST;  Surgeon: Dereck Leep, MD;  Location: [facility] ORS;  Service: Orthopedics;  Laterality: Right;  . GANGLION CYST EXCISION Right 11/10/2016   Procedure: REMOVAL GANGLION OF WRIST;  Surgeon: Dereck Leep, MD;  Location: [facility] ORS;  Service: Orthopedics;  Laterality: Right;  . HERNIA REPAIR    . MULTIPLE TOOTH EXTRACTIONS  06/2015   for dentures  . UMBILICAL HERNIA REPAIR  02/25/2010    Allergies Celebrex [celecoxib]; Latex; Statins; and Adhesive [tape]  Social History Social History   Tobacco Use  .  Smoking status: Former Research scientist (life sciences)  . Smokeless tobacco: Never Used  Substance Use Topics  . Alcohol use: No  . Drug use: No   Review of Systems Constitutional: Negative for fever. ENT: Positive for bruising on her chin and scalp Cardiovascular: Negative for chest pain. Respiratory: Negative for shortness of breath. Gastrointestinal: Negative for abdominal pain, vomiting and diarrhea. Musculoskeletal: Negative for back pain. Skin: Positive for bruising Neurological: Positive for headache and dizziness  All systems negative/normal/unremarkable except as stated in the HPI  ____________________________________________   PHYSICAL EXAM:  VITAL SIGNS: ED Triage Vitals  Enc Vitals Group     BP 11/11/17 1150 139/61     Pulse Rate 11/11/17 1150 88     Resp 11/11/17 1150 18     Temp 11/11/17 1150 97.7 F (36.5 C)     Temp Source 11/11/17 1150 Oral     SpO2 11/11/17 1150 98 %     Weight 11/11/17 1149 135 lb (61.2 kg)     Height 11/11/17 1149 5\' 4"  (1.626 m)     Head Circumference --      Peak Flow --      Pain Score 11/11/17 1149 7     Pain Loc --      Pain Edu? --      Excl. in [place]? --    Constitutional: Alert and oriented. Well appearing and in no distress. Eyes: Conjunctivae are normal. Normal extraocular movements. ENT   Head: Normocephalic,  large right temporoparietal scalp contusion   Nose: No congestion/rhinnorhea.   Mouth/Throat: Mucous membranes are moist.  Small bruising noted to her chin   Neck: No stridor. Cardiovascular: Normal rate, regular rhythm. No murmurs, rubs, or gallops. Respiratory: Normal respiratory effort without tachypnea nor retractions. Breath sounds are clear and equal bilaterally. No wheezes/rales/rhonchi. Gastrointestinal: Soft and nontender. Normal bowel sounds Musculoskeletal: Nontender with normal range of motion in extremities. No lower extremity tenderness nor edema. Neurologic:  Normal speech and language. No gross focal neurologic  deficits are appreciated.  Skin: Scattered bruising as noted above Psychiatric: Mood and affect are normal. Speech and behavior are normal.  ____________________________________________  EKG: Interpreted by me.  Sinus rhythm rate 84 bpm, low voltage, likely septal infarct age-indeterminate, normal axis  ____________________________________________  ED COURSE:  As part of my medical decision making, I reviewed the following data within the [place] History obtained from family if available, nursing notes, old chart and ekg, as well as notes from prior ED visits. Patient presented for a fall, we will assess with labs and imaging as indicated at this time.   Procedures ____________________________________________   LABS (pertinent positives/negatives)  Labs Reviewed  CBC WITH DIFFERENTIAL/PLATELET - Abnormal; Notable for the following components:      Result Value   HCT 33.9 (*)    RDW 14.8 (*)    All other components within normal limits  COMPREHENSIVE METABOLIC PANEL - Abnormal; Notable for the following components:   Sodium 134 (*)    Chloride 95 (*)    Glucose, Bld 118 (*)    All other components within normal limits  TROPONIN I  URINALYSIS, COMPLETE (UACMP) WITH MICROSCOPIC    RADIOLOGY Images were viewed by me  CT head Is unremarkable ____________________________________________  DIFFERENTIAL DIAGNOSIS   Contusion, fracture, dehydration, electrolyte abnormality, occult infection  FINAL ASSESSMENT AND PLAN  Minor head injury, contusion   Plan: The patient had presented for a fall. Patient's labs not reveal any acute process. Patient's imaging is negative.  Overall she appears well, does have a large scalp contusion and scattered contusions but is cleared for outpatient follow-up.   Laurence Aly, MD   Note: This note was generated in part or whole with voice recognition software. Voice recognition is usually quite accurate but  there are transcription errors that can and very often do occur. I apologize for any typographical errors that were not detected and corrected.     Earleen Newport, MD 11/11/17 [phone]

## 2017-12-08 ENCOUNTER — Encounter: Payer: Self-pay | Admitting: Psychology

## 2017-12-08 ENCOUNTER — Ambulatory Visit: Payer: Medicare Other | Admitting: Psychology

## 2017-12-08 ENCOUNTER — Ambulatory Visit (INDEPENDENT_AMBULATORY_CARE_PROVIDER_SITE_OTHER): Payer: Medicare Other | Admitting: Psychology

## 2017-12-08 DIAGNOSIS — G301 Alzheimer's disease with late onset: Secondary | ICD-10-CM | POA: Diagnosis not present

## 2017-12-08 DIAGNOSIS — F0280 Dementia in other diseases classified elsewhere without behavioral disturbance: Secondary | ICD-10-CM | POA: Diagnosis not present

## 2017-12-08 NOTE — Progress Notes (Signed)
   Neuropsychology Note  Sonya Carey completed 60 minutes of neuropsychological testing with technician, Milana Kidney, BS, under the supervision of Dr. Macarthur Critchley, Licensed Psychologist. The patient did not appear overtly distressed by the testing session, per behavioral observation or via self-report to the technician. Rest breaks were offered.   Clinical Decision Making: In considering the patient's current level of functioning, level of presumed impairment, nature of symptoms, emotional and behavioral responses during the interview, level of literacy, and observed level of motivation/effort, a battery of tests was selected and communicated to the psychometrician.  Communication between the psychologist and technician was ongoing throughout the testing session and changes were made as deemed necessary based on patient performance on testing, technician observations and additional pertinent factors such as those listed above.  [name] will return within approximately 2 weeks for an interactive feedback session with Dr. Si Raider at which time her test performances, clinical impressions and treatment recommendations will be reviewed in detail. The patient understands she can contact our office should she require our assistance before this time.  35 minutes spent performing neuropsychological evaluation services/clinical decision making (psychologist). [CPT 86754] 60 minutes spent face-to-face with patient administering standardized tests, 30 minutes spent scoring (technician). [CPT [id number], 49201]  Full report to follow.

## 2017-12-08 NOTE — Progress Notes (Signed)
NEUROBEHAVIORAL STATUS EXAM   Name: [name] Date of Birth: [birth year] Date of Interview: 12/08/2017  Reason for Referral:  LAIANA FRATUS is a 84 y.o. female who is referred for neuropsychological re-evaluation by Dr. Tracie Harrier of [facility] due to concerns about worsening memory. This patient is accompanied in the office by her daughter, [name], who supplements the history. The patient is a poor historian.  History of Presenting Problem [per initial interview on 10/11/2016]:  Mrs. Dahan and her daughter reported gradual onset of short term memory loss over the past 2-3 years with some progression over time. [name] reports that the patient's memory decline was not interfering with functioning until January of this year when the patient could not do her taxes. She had done them every year before that. It was very upsetting to her that she could not figure them out. She saw her PCP and was prescribed Aricept. She was also prescribed Zoloft for grief related depression. She had a fall in April of this year and injured her hand. She underwent brain MRI on 09/27/2016 which reportedly revealed moderate atrophy, moderate chronic changes in the pons and white matter consistent with microvascular ischemia, no acute infarct, and 1 cm multilocular cystic lesion in the right side of the pituitary with fluid fluid level within the cyst. She saw Dr. Lacinda Axon for neurosurgery consult on 10/05/2016 who stated this was likely an incidental finding on workup and not related to her memory difficulty. He did not recommend any intervention as neither biopsy nor resection would carry any added benefit. Repeat MRI in 4-6 months was recommended in order to monitor.   Upon direct questioning, the patient and her daughter reported the following with regard to current cognitive functioning:  Forgetting recent conversations/events: Yes per daughter Misplacing/losing items: Not  a lot. Sometimes difficulty finding where she put medications. Forgetting appointments or other obligations: Yes per daughter Forgetting to take medications: Yes Difficulty concentrating: No. Reads paper every day, plays cards (although not as much lately) Starting but not finishing tasks: No per patient, yes per daughter Word-finding difficulty: No, except she does forget names Comprehension difficulty: No Getting lost when driving: No  Mrs. Segundo lives in [place]. She lives alone since her husband passed away in late 06-17-14. She was driving up until April when she had her fall and hurt her hand. She agreed to her daughter taking her car, and she will not return to driving until after she has an operation in August and after she has occupational therapy at [facility] for driving. Her daughter assists with management of medications (fills her pillboxes and reminds her to take them), finances (she bounced some checks in the past few months and can no longer do her taxes), and appointments (they manage them together).   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 She has been experiencing significant grief related to his loss but Zoloft has been helpful. Her mood has improved significantly in the past month or so. She is calmer and more amenable to getting assistance with things. She is still grieving, but her grief is not preventing her from doing things.   Psychiatric history was denied. She has never seen a mental health provider and she and her daughter denied any history of significant depression or anxiety prior to losing her husband. She also has no remote or recent history of hallucinations or psychosis.  Physically, the  patient has no current complaints. She denies any difficulty with walking or balance. She uses a cane outside of her house. She has not had any more falls since the one in April. She denies any sleep difficulty but her  daughter notes that she has reported having difficulty falling asleep, sometimes staying up until 3 am if she cannot fall asleep. She is sleeping much longer/later in the morning/day. She has had gradual weight loss over the past 3 years with low appetite. Her appetite does seem to have improved and her weight seems to have leveled off recently. She had colon cancer 5 years ago which was self contained and treated surgically. She had no recurrence.   Results of previous neuropsychological evaluation [11/01/2016 report]: Clinical Impressions: Mild dementia most likely secondary to Alzheimer's disease. Results of the current cognitive evaluation reveal impairment in learning and memory, as well as in executive functioning. Other aspects of cognitive functioning assessed, including processing speed, attention, visual spatial skills, and language were all intact. There is evidence that her cognitive deficits are interfering with her ability to manage complex tasks, such as managing finances, appointments and medications. As such, diagnostic criteria for a dementia syndrome are met.  The patient's cognitive profile is suggestive of medial-temporal lobe involvement. Alzheimer's disease is the most likely etiology, given her cognitive profile and clinical features. I would characterize her dementia as mild stage. Fortunately, there is no evidence of mood disturbance or primary psychiatric disorder.    Interim History and Current Functioning [12/08/2017]: Since her evaluation with me last year, Ms. Pearman has moved from an independent living apartment to an assisted living apartment at [facility] She has also stopped driving and sold her car. ALF staff now administer her medications. Her daughter manages her bills and finances. The patient does not do any cooking and she does not manage her appointments.  Her daughter reports her short term memory has worsened over the past year, but her mood has improved. She  does not appear depressed. She feels back to her "old self". She enjoys socializing with fellow residents on a daily basis. She also has hearing aids now which is helpful.   She fell on 11/11/2017 while getting her hair done. It is unclear if she lost consciousness. She had nausea and dizziness afterwards. She was taken to [facility] ED. Labs were unremarkable and head CT and cervical spine CT did not show any acute abnormality. She did have large scalp contusion and bruising to her chin and arm. She was not disoriented or confused. Her daughter denies any change in mental status afterwards. She did have headaches but these have resolved. She continues to have dizziness but it is improving.   The patient has not had any other falls.  She has not had any hallucinations or behavioral disturbance. There is no wandering.   She denies any sleep difficulty but her daughter thinks she may be up in the middle of the night.    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 Marital history: Widowed, lives alone, 4 children(3 sons and 1 daughter), 74 grands, 4 greats Alcohol: Very minimal alcohol, rare glass of wine Tobacco: Former smoker, quit 1974   Medical History: Past Medical History:  Diagnosis Date  . Colon cancer ([facility])    adenocarcinoma of transverse colon  . Depression   . Fracture, metacarpal 06/2016  5th metacarpal  . History of chicken pox   . Hypertension   . Hypothyroidism   . Macular degeneration    left eye  . Thyroid disease      Current Medications:  Outpatient Encounter Medications as of 12/08/2017  Medication Sig  . amLODipine (NORVASC) 5 MG tablet Take 5 mg by mouth daily.  Marland Kitchen aspirin 325 MG tablet Take 325 mg by mouth every morning.   . Calcium-Magnesium 500-250 MG TABS Take 2 tablets by mouth 2 (two) times daily.  . cetirizine  (ZYRTEC) 10 MG tablet Take 10 mg by mouth daily.  . chlorhexidine (PERIDEX) 0.12 % solution as directed.  . cholecalciferol (VITAMIN D) 1000 units tablet Take 1,000 Units by mouth daily.  Marland Kitchen donepezil (ARICEPT) 10 MG tablet Take 10 mg by mouth at bedtime.   . hydroxypropyl methylcellulose / hypromellose (ISOPTO TEARS / GONIOVISC) 2.5 % ophthalmic solution Place 1 drop into both eyes 2 (two) times daily as needed for dry eyes.  . Multiple Vitamins-Minerals (MULTIVITAMIN ADULT PO) Take 7 tablets by mouth daily. SHAKLEE "LIFE STRIP" MULTIVITAMIN PACK  . OVER THE COUNTER MEDICATION Take 1 tablet by mouth daily. SHAKLEE HERBLAX  . OVER THE COUNTER MEDICATION Take 2 tablets by mouth daily. SHAKLEE OSTEO MATRIX  . sertraline (ZOLOFT) 100 MG tablet Take 100 mg by mouth at bedtime.   Marland Kitchen thyroid (ARMOUR) 30 MG tablet Take 75 mg by mouth daily before breakfast.   . triamcinolone cream (KENALOG) 0.1 % Apply 1 application topically daily as needed (skin breakdown).   No facility-administered encounter medications on file as of 12/08/2017.      Behavioral Observations:  Appearance: Neatly and appropriately dressedand groomed Gait: Ambulated with a walker, mildly to moderately unsteady gait Speech: Fluent; normal rate, rhythm and volume. No significantword finding difficulty. Repeats herself frequently due to memory loss (similar to last year). Thought process: Generally linear; mildly tangential at times. Affect: Full,bright, euthymic Interpersonal: Pleasant, appropriate   60 minutes spent face-to-face with patient completing neurobehavioral status exam. 40 minutes spent integrating medical records/clinical data and completing this report. CPT [id number] unit; [id number] unit.   TESTING: There is medical necessity to proceed with neuropsychological assessment as the results will be used to aid in differential diagnosis and clinical decision-making and to inform specific treatment recommendations. Per the  patient, her daughter and medical records reviewed, there has been a worsening in memory loss over the past year and she has fallen and hit her head (?concussion) which may be exacerbating memory loss.  Clinical Decision Making: In considering the patient's current level of functioning, level of presumed impairment, nature of symptoms, emotional and behavioral responses during the interview, level of literacy, and observed level of motivation, a battery of tests was selected and communicated to the psychometrician.   Following the clinical interview/neurobehavioral status exam, the patient completed this full battery of neuropsychological testing with my psychometrician under my supervision (see separate note).   PLAN: The patient will return to see me for a follow-up session at which time her test performances and my impressions and treatment recommendations will be reviewed in detail.  Evaluation ongoing; full report to follow.

## 2017-12-22 ENCOUNTER — Encounter: Payer: Medicare Other | Admitting: Psychology

## 2017-12-26 NOTE — Progress Notes (Signed)
[place] RE-EVALUATION   Name:    Sonya Carey  Date of Birth:   Sep 25, 1924 Date of Interview:  12/08/2017 Date of Testing:  12/08/2017   Date of Feedback:  12/27/2017       Background Information:  Reason for Referral:  Sonya Carey is a 84 y.o. female referred by Dr. Tracie Harrier of [facility] to assess her current level of cognitive functioning and assist in differential diagnosis. The current evaluation consisted of a review of available medical records, an interview with the patient and her daughter, Sonya Carey, and the completion of a neuropsychological testing battery. Informed consent was obtained.  History of Presenting Problem [per initial interview on 10/11/2016]:  Sonya Carey and her daughter reported gradual onset of short term memory loss over the past 2-3 years with some progression over time. Sonya Carey reports that the patient's memory decline was not interfering with functioning until January of this year when the patient could not do her taxes. She had done them every year before that. It was very upsetting to her that she could not figure them out. She saw her PCP and was prescribed Aricept. She was also prescribed Zoloft for grief related depression. She had a fall in April of this year and injured her hand. She underwent brain MRI on 09/27/2016 which reportedly revealed moderate atrophy, moderate chronic changes in the pons and white matter consistent with microvascular ischemia, no acute infarct, and 1 cm multilocular cystic lesion in the right side of the pituitary with fluid fluid level within the cyst. She saw Dr. Lacinda Axon for neurosurgery consult on 10/05/2016 who stated this was likely an incidental finding on workup and not related to her memory difficulty. He did not recommend any intervention as neither biopsy nor resection would carry any added benefit. Repeat MRI in 4-6 months was recommended in order to monitor.   Upon  direct questioning, the patient and her daughter reported the following with regard to current cognitive functioning:  Forgetting recent conversations/events: Yes per daughter Misplacing/losing items: Not a lot. Sometimes difficulty finding where she put medications. Forgetting appointments or other obligations: Yes per daughter Forgetting to take medications: Yes Difficulty concentrating: No. Reads paper every day, plays cards (although not as much lately) Starting but not finishing tasks: No per patient, yes per daughter Word-finding difficulty: No, except she does forget names Comprehension difficulty: No Getting lost when driving: No  Sonya Carey lives in [place]. She lives alone since her husband passed away in late 2014-06-12. She was driving up until April when she had her fall and hurt her hand. She agreed to her daughter taking her car, and she will not return to driving until after she has an operation in August and after she has occupational therapy at [facility] for driving. Her daughter assists with management of medications (fills her pillboxes and reminds her to take them), finances (she bounced some checks in the past few months and can no longer do her taxes), and appointments (they manage them together).   The patient was the primary caregiver for her husband until he passed away in late 2014-06-12. She has been experiencing significant grief related to his loss but Zoloft has been helpful. Her mood has improved significantly in the past month or so. She is calmer and more amenable to getting assistance with things. She is still grieving, but her grief is not preventing her from doing things.   Psychiatric history was denied. She has  never seen a mental health provider and she and her daughter denied any history of significant depression or anxiety prior to losing her husband. She also has no remote or recent history of hallucinations or psychosis.  Physically, the patient has no  current complaints. She denies any difficulty with walking or balance. She uses a cane outside of her house. She has not had any more falls since the one in April. She denies any sleep difficulty but her daughter notes that she has reported having difficulty falling asleep, sometimes staying up until 3 am if she cannot fall asleep. She is sleeping much longer/later in the morning/day. She has had gradual weight loss over the past 3 years with low appetite. Her appetite does seem to have improved and her weight seems to have leveled off recently. She had colon cancer 5 years ago which was self contained and treated surgically. She had no recurrence.   Results of previous neuropsychological evaluation [11/01/2016 report]: Clinical Impressions:Mild dementia most likely secondary to Alzheimer's disease. Results of the current cognitive evaluation revealimpairmentin learning and memory,as well as in executive functioning. Other aspects of cognitive functioning assessed, including processing speed, attention, visual spatial skills, and language were all intact.There is evidence that her cognitive deficits are interfering with her ability to manage complex tasks, such as managing finances, appointments and medications. As such, diagnostic criteria for a dementia syndrome are met.  The patient's cognitive profile is suggestive of medial-temporal lobe involvement. Alzheimer's disease is the most likely etiology, given her cognitive profileandclinical features. I would characterize her dementia as mild stage.Fortunately, there is no evidence ofmood disturbance or primary psychiatric disorder.    Interim History and Current Functioning [12/08/2017]: Since her evaluation with me last year, Sonya Carey has moved from an independent living apartment to an assisted living apartment at [facility]. She has also stopped driving and sold her car. ALF staff now administer her medications. Her daughter manages her  bills and finances. The patient does not do any cooking and she does not manage her appointments.  Her daughter reports her short term memory has worsened over the past year, but her mood has improved. She does not appear depressed. She feels back to her "old self". She enjoys socializing with fellow residents on a daily basis. She also has hearing aids now which is helpful.   She fell on 11/11/2017 while getting her hair done. It is unclear if she lost consciousness. She had nausea and dizziness afterwards. She was taken to [facility] ED. Labs were unremarkable and head CT and cervical spine CT did not show any acute abnormality. She did have large scalp contusion and bruising to her chin and arm. She was not disoriented or confused. Her daughter denies any change in mental status afterwards. She did have headaches but these have resolved. She continues to have dizziness but it is improving.   The patient has not had any other falls.  She has not had any hallucinations or behavioral disturbance. There is no wandering.   She denies any sleep difficulty but her daughter thinks she may be up in the middle of the night.    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 Marital history: Widowed, lives alone, 4 children(3 sons and 1 daughter), 43 grands, 4 greats Alcohol: Very minimal alcohol, rare glass of wine Tobacco: Former smoker, quit [place]  History:  Past Medical History:  Diagnosis Date  . Colon cancer ([place])    adenocarcinoma of transverse colon  . Depression   . Fracture, metacarpal 06/2016   5th metacarpal  . History of chicken pox   . Hypertension   . Hypothyroidism   . Macular degeneration    left eye  . Thyroid disease     Current medications:  Outpatient Encounter Medications as of 12/27/2017  Medication Sig  .  amLODipine (NORVASC) 5 MG tablet Take 5 mg by mouth daily.  Marland Kitchen aspirin 325 MG tablet Take 325 mg by mouth every morning.   . Calcium-Magnesium 500-250 MG TABS Take 2 tablets by mouth 2 (two) times daily.  . cetirizine (ZYRTEC) 10 MG tablet Take 10 mg by mouth daily.  . chlorhexidine (PERIDEX) 0.12 % solution as directed.  . cholecalciferol (VITAMIN D) 1000 units tablet Take 1,000 Units by mouth daily.  Marland Kitchen donepezil (ARICEPT) 10 MG tablet Take 10 mg by mouth at bedtime.   . hydroxypropyl methylcellulose / hypromellose (ISOPTO TEARS / GONIOVISC) 2.5 % ophthalmic solution Place 1 drop into both eyes 2 (two) times daily as needed for dry eyes.  . Multiple Vitamins-Minerals (MULTIVITAMIN ADULT PO) Take 7 tablets by mouth daily. SHAKLEE "LIFE STRIP" MULTIVITAMIN PACK  . OVER THE COUNTER MEDICATION Take 1 tablet by mouth daily. SHAKLEE HERBLAX  . OVER THE COUNTER MEDICATION Take 2 tablets by mouth daily. SHAKLEE OSTEO MATRIX  . sertraline (ZOLOFT) 100 MG tablet Take 100 mg by mouth at bedtime.   Marland Kitchen thyroid (ARMOUR) 30 MG tablet Take 75 mg by mouth daily before breakfast.   . triamcinolone cream (KENALOG) 0.1 % Apply 1 application topically daily as needed (skin breakdown).   No facility-administered encounter medications on file as of 12/27/2017.      Current Examination:  Behavioral Observations:  Appearance: Neatly and appropriately dressedand groomed Gait: Ambulated with a walker, mildly to moderately unsteady gait Speech: Fluent; normal rate, rhythm and volume. No significantword finding difficulty. Repeats herself frequently due to memory loss (similar to last year). Thought process: Generally linear; mildly tangential at times. Affect: Full,bright, euthymic Interpersonal: Pleasant, appropriate Orientation: Oriented to person and place. Not oriented to time (did not know month, date, day or year; inaccurately stated current age as "12"). Unable to name current President or his  predecessor.  Tests Administered: . Dementia Rating Scale - 2nd Edition (DRS-2) . Wechsler Adult Intelligence Scale-Fourth Edition (WAIS-IV): Coding subtest . [place] Verbal Learning Test - 2nd Edition (CVLT-2) Short Form . Neuropsychological Assessment Battery (NAB) Language Module, Form 1: Naming subtest . Boston Diagnostic Aphasia Examination: Complex Ideational Material subtest . Controlled Oral Word Association Test (COWAT) . Trail Making Test A and B . Clock drawing test . Geriatric Depression Scale (GDS) 15 Item . Generalized Anxiety Disorder - 7 item screener (GAD-7)  Test Results: Note: Standardized scores are presented only for use by appropriately trained professionals and to allow for any future test-retest comparison. These scores should not be interpreted without consideration of all the information that is contained in the rest of the report. The most recent standardization samples from the test publisher or other sources were used whenever possible to derive standard scores; scores were corrected for age, gender, ethnicity and education when available.   Test Scores:  Test Name Raw Score Standardized Score Descriptor  DRS-2     Attention 36 ss= 12 High average  Initiation/Perseveration 31 ss= 8 Low end of average  Construction 6 ss= 10 Average  Conceptualization 32 ss= 9 Average  Memory 12 ss= 2 Impaired  Total Score 117 ss= 6 Low average  Total Score-Education Adjusted (AEMSS)  ss= 5 Borderline  WAIS-IV Subtest     Coding 29/135 ss= 9 Average  CVLT-II Scores     Trial 1 3/9 Z= -2.5 Impaired  Trial 4 5/9 Z= -1.5 Borderline  Trials 1-4 total 17/36 T= 32 Borderline  SD Free Recall 0/9 Z= -2.5 Impaired  LD Free Recall 0/9 Z= -2.5 Impaired  LD Cued Recall 0/9 Z= -3.5 Severely impaired  Total Intrusions 9 Z= -5 Severely impaired  Recognition Hits 8/9 hits Z= -0.5 Average  Recognition False Positives 6 Z= -1.5 Borderline  Forced Choice Recognition 9/9  WNL  NAB  Naming 27/31 T= 42 Low average  BDAE Complex Ideational Material 6/6  WNL  COWAT-FAS 30 T= 44 Average  COWAT-Animals 13 T= 42 Low average  Trail Making Test A  67" 1 error T= 48 Average  Trail Making Test B  226" 2 errors T= 41 Low average  Clock Drawing   Impaired  GDS-15 0/15  WNL  GAD-7 0/21  WNL      Description of Test Results:  On a dementia screening measure (DRS-2), the patient's overall score fell within the borderline range. This represents statistically significant decline since her last evaluation when her overall score fell in the low end of average range. She continued to demonstrate several performances that were within normal limits for age among the subtests of the DRS-2, including basic attention (high average), initiation/perseveration (low end of average), and basic visual-spatial construction (average). She demonstrated decline on the conceptualization subtest (from high average to average). She again demonstrated significant relative weakness on the memory subtest (impaired, stable).  On other cognitive tests, she demonstrated mild decline in psychomotor processing speed. Performance on a test of verbal learning and memory was again significantly impaired (stable). Confrontation naming was reduced from last year but still fell within the low average range for age. Auditory comprehension remained intact. Phonemic verbal fluency remained in the average range for age. Semantic verbal fluency (for animals) was reduced compared to last year (low average). Clock drawing was again impaired. Interestingly, she performed better on Trails B this year. Last year she was unable to complete the task as she could not fully grasp the task instructions. This year, she demonstrated much better understanding of task instructions and completed the task before the time limit and in the low average range for age.  On self-report measures of mood, the patient's responses were not indicative of  clinically significant depression or anxiety at the present time.   Clinical Impressions: Mild dementia due to Alzheimer's disease, with mild decline since last evaluation. The patient's cognitive profile continues to be in keeping with medial-temporal lobe involvement and more specifically hippocampal consolidation dysfunction. This continues to support a diagnosis of Alzheimer's disease. Based on test performances and daily functioning, this is still considered mild stage dementia.  The areas of most profound impairment continue to be temporal orientation and memory consolidation. Psychomotor processing speed, confrontation naming, semantic verbal fluency, and verbal abstract reasoning are mildly declined relative to last year. This reflects normal and gradual progression of the illness. She continues to demonstrate functioning that is normal for age in the following areas: psychomotor processing speed, basic attention, basic visual-spatial construction, verbal abstract reasoning, and phonemic verbal fluency. This year, she was able to complete a task requiring mental flexibility and set-shifting (Trails B) with an overall  low average performance for age.  There continues to be no evidence of mood disturbance or primary psychiatric disorder, and overall her mood appears improved since her last evaluation.   Recommendations/Plan: Based on the findings of the present evaluation, the following recommendations are offered:  1. Continue Aricept. 2. Continue assistance with complex ADLs (transportation, medications, finances, appointments, meals). She appears to have the appropriate level of support for her care needs. 3. Continue to participate in activities which provide mental stimulation, safe cardiovascular exercise, and social interaction. 4. Ongoing caregiver education and support for the patient's family.   Feedback to Patient: NAOME BRIGANDI and her daughter returned for a feedback  appointment on 12/27/2017 to review the results of her neuropsychological evaluation with this provider. 20 minutes face-to-face time was spent reviewing her test results, my impressions and my recommendations as detailed above.    Total time spent on this patient's case: 100  minutes for neurobehavioral status exam with psychologist (CPT code [phone], [phone] unit); 90 minutes of testing/scoring by psychometrician under psychologist's supervision (CPT codes [phone], [phone] units); 180 minutes for integration of patient data, interpretation of standardized test results and clinical data, clinical decision making, treatment planning and preparation of this report, and interactive feedback with review of results to the patient/family by psychologist (CPT codes [phone], [phone] units).      Thank you for your referral of [facility]. Please feel free to contact me if you have any questions or concerns regarding this report.

## 2017-12-27 ENCOUNTER — Encounter: Payer: Self-pay | Admitting: Psychology

## 2017-12-27 ENCOUNTER — Ambulatory Visit (INDEPENDENT_AMBULATORY_CARE_PROVIDER_SITE_OTHER): Payer: Medicare Other | Admitting: Psychology

## 2017-12-27 DIAGNOSIS — F0280 Dementia in other diseases classified elsewhere without behavioral disturbance: Secondary | ICD-10-CM | POA: Diagnosis not present

## 2017-12-27 DIAGNOSIS — G301 Alzheimer's disease with late onset: Secondary | ICD-10-CM

## 2017-12-27 DIAGNOSIS — F02C3 Dementia in other diseases classified elsewhere, severe, with mood disturbance: Secondary | ICD-10-CM | POA: Diagnosis present

## 2017-12-27 DIAGNOSIS — F0390 Unspecified dementia without behavioral disturbance: Secondary | ICD-10-CM | POA: Diagnosis present

## 2018-01-25 ENCOUNTER — Telehealth: Payer: Self-pay | Admitting: Psychology

## 2018-01-25 NOTE — Telephone Encounter (Signed)
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 We have mailed a letter to patient to let her know we have canceled all appointments with Bailar. Thank you for your understanding °

## 2018-12-14 ENCOUNTER — Encounter: Payer: Medicare Other | Admitting: Psychology

## 2019-03-01 ENCOUNTER — Inpatient Hospital Stay: Payer: Medicare Other

## 2019-03-01 ENCOUNTER — Emergency Department: Payer: Medicare Other

## 2019-03-01 ENCOUNTER — Other Ambulatory Visit: Payer: Self-pay

## 2019-03-01 ENCOUNTER — Inpatient Hospital Stay
Admission: EM | Admit: 2019-03-01 | Discharge: 2019-03-06 | DRG: 200 | Disposition: A | Discharge status: Skilled Nursing Facility | Payer: Medicare Other | Source: Skilled Nursing Facility | Attending: Internal Medicine | Admitting: Internal Medicine

## 2019-03-01 ENCOUNTER — Encounter: Payer: Self-pay | Admitting: Emergency Medicine

## 2019-03-01 DIAGNOSIS — I10 Essential (primary) hypertension: Secondary | ICD-10-CM | POA: Diagnosis present

## 2019-03-01 DIAGNOSIS — H3530 Unspecified macular degeneration: Secondary | ICD-10-CM | POA: Diagnosis present

## 2019-03-01 DIAGNOSIS — Z8619 Personal history of other infectious and parasitic diseases: Secondary | ICD-10-CM | POA: Diagnosis not present

## 2019-03-01 DIAGNOSIS — S272XXA Traumatic hemopneumothorax, initial encounter: Principal | ICD-10-CM | POA: Diagnosis present

## 2019-03-01 DIAGNOSIS — Z888 Allergy status to other drugs, medicaments and biological substances status: Secondary | ICD-10-CM | POA: Diagnosis not present

## 2019-03-01 DIAGNOSIS — Z91048 Other nonmedicinal substance allergy status: Secondary | ICD-10-CM

## 2019-03-01 DIAGNOSIS — W0110XA Fall on same level from slipping, tripping and stumbling with subsequent striking against unspecified object, initial encounter: Secondary | ICD-10-CM | POA: Diagnosis present

## 2019-03-01 DIAGNOSIS — J939 Pneumothorax, unspecified: Secondary | ICD-10-CM

## 2019-03-01 DIAGNOSIS — F329 Major depressive disorder, single episode, unspecified: Secondary | ICD-10-CM | POA: Diagnosis present

## 2019-03-01 DIAGNOSIS — S2241XA Multiple fractures of ribs, right side, initial encounter for closed fracture: Secondary | ICD-10-CM | POA: Diagnosis present

## 2019-03-01 DIAGNOSIS — Z20828 Contact with and (suspected) exposure to other viral communicable diseases: Secondary | ICD-10-CM | POA: Diagnosis present

## 2019-03-01 DIAGNOSIS — S2249XA Multiple fractures of ribs, unspecified side, initial encounter for closed fracture: Secondary | ICD-10-CM | POA: Diagnosis present

## 2019-03-01 DIAGNOSIS — Z789 Other specified health status: Secondary | ICD-10-CM

## 2019-03-01 DIAGNOSIS — Z91040 Latex allergy status: Secondary | ICD-10-CM | POA: Diagnosis not present

## 2019-03-01 DIAGNOSIS — E785 Hyperlipidemia, unspecified: Secondary | ICD-10-CM | POA: Diagnosis present

## 2019-03-01 DIAGNOSIS — J9811 Atelectasis: Secondary | ICD-10-CM | POA: Diagnosis present

## 2019-03-01 DIAGNOSIS — S270XXA Traumatic pneumothorax, initial encounter: Secondary | ICD-10-CM

## 2019-03-01 DIAGNOSIS — Y92128 Other place in nursing home as the place of occurrence of the external cause: Secondary | ICD-10-CM | POA: Diagnosis not present

## 2019-03-01 DIAGNOSIS — Z85038 Personal history of other malignant neoplasm of large intestine: Secondary | ICD-10-CM

## 2019-03-01 DIAGNOSIS — K5900 Constipation, unspecified: Secondary | ICD-10-CM | POA: Diagnosis present

## 2019-03-01 DIAGNOSIS — Z79899 Other long term (current) drug therapy: Secondary | ICD-10-CM | POA: Diagnosis not present

## 2019-03-01 DIAGNOSIS — Z79890 Hormone replacement therapy (postmenopausal): Secondary | ICD-10-CM

## 2019-03-01 DIAGNOSIS — W19XXXA Unspecified fall, initial encounter: Secondary | ICD-10-CM | POA: Diagnosis not present

## 2019-03-01 DIAGNOSIS — Z87891 Personal history of nicotine dependence: Secondary | ICD-10-CM

## 2019-03-01 DIAGNOSIS — H9190 Unspecified hearing loss, unspecified ear: Secondary | ICD-10-CM | POA: Diagnosis present

## 2019-03-01 DIAGNOSIS — D509 Iron deficiency anemia, unspecified: Secondary | ICD-10-CM | POA: Diagnosis present

## 2019-03-01 DIAGNOSIS — T797XXA Traumatic subcutaneous emphysema, initial encounter: Secondary | ICD-10-CM | POA: Diagnosis present

## 2019-03-01 DIAGNOSIS — E039 Hypothyroidism, unspecified: Secondary | ICD-10-CM | POA: Diagnosis present

## 2019-03-01 LAB — CBC WITH DIFFERENTIAL/PLATELET
Abs Immature Granulocytes: 0.14 10*3/uL — ABNORMAL HIGH (ref 0.00–0.07)
Basophils Absolute: 0 10*3/uL (ref 0.0–0.1)
Basophils Relative: 0 %
Eosinophils Absolute: 0.1 10*3/uL (ref 0.0–0.5)
Eosinophils Relative: 0 %
HCT: 35.7 % — ABNORMAL LOW (ref 36.0–46.0)
Hemoglobin: 11.6 g/dL — ABNORMAL LOW (ref 12.0–15.0)
Immature Granulocytes: 1 %
Lymphocytes Relative: 11 %
Lymphs Abs: 1.9 10*3/uL (ref 0.7–4.0)
MCH: 30.5 pg (ref 26.0–34.0)
MCHC: 32.5 g/dL (ref 30.0–36.0)
MCV: 93.9 fL (ref 80.0–100.0)
Monocytes Absolute: 1 10*3/uL (ref 0.1–1.0)
Monocytes Relative: 6 %
Neutro Abs: 13.6 10*3/uL — ABNORMAL HIGH (ref 1.7–7.7)
Neutrophils Relative %: 82 %
Platelets: 247 10*3/uL (ref 150–400)
RBC: 3.8 MIL/uL — ABNORMAL LOW (ref 3.87–5.11)
RDW: 13 % (ref 11.5–15.5)
WBC: 16.6 10*3/uL — ABNORMAL HIGH (ref 4.0–10.5)
nRBC: 0 % (ref 0.0–0.2)

## 2019-03-01 LAB — BASIC METABOLIC PANEL
Anion gap: 11 (ref 5–15)
BUN: 19 mg/dL (ref 8–23)
CO2: 19 mmol/L — ABNORMAL LOW (ref 22–32)
Calcium: 8.6 mg/dL — ABNORMAL LOW (ref 8.9–10.3)
Chloride: 108 mmol/L (ref 98–111)
Creatinine, Ser: 0.97 mg/dL (ref 0.44–1.00)
GFR calc Af Amer: 59 mL/min — ABNORMAL LOW (ref >60)
GFR calc non Af Amer: 51 mL/min — ABNORMAL LOW (ref >60)
Glucose, Bld: 143 mg/dL — ABNORMAL HIGH (ref 70–99)
Potassium: 4 mmol/L (ref 3.5–5.1)
Sodium: 138 mmol/L (ref 135–145)

## 2019-03-01 LAB — SARS CORONAVIRUS 2 (TAT 6-24 HRS): SARS Coronavirus 2: NEGATIVE

## 2019-03-01 LAB — TSH: TSH: 10.059 u[IU]/mL — ABNORMAL HIGH (ref 0.350–4.500)

## 2019-03-01 LAB — TROPONIN I (HIGH SENSITIVITY): Troponin I (High Sensitivity): 5 ng/L (ref <18)

## 2019-03-01 MED ORDER — ONDANSETRON HCL 4 MG/2ML IJ SOLN: 4.0000 mg | Freq: Four times a day (QID) | INTRAMUSCULAR | Status: DC | PRN

## 2019-03-01 MED ORDER — ACETAMINOPHEN 325 MG PO TABS: 650.0000 mg | ORAL_TABLET | Freq: Four times a day (QID) | ORAL | Status: DC | PRN

## 2019-03-01 MED ORDER — OXYCODONE HCL 5 MG PO TABS
5.0000 mg | ORAL_TABLET | ORAL | Status: DC | PRN
  Administered 2019-03-01 – 2019-03-06 (×12): 5 mg via ORAL
  Filled 2019-03-01 (×12): qty 1

## 2019-03-01 MED ORDER — DOCUSATE SODIUM 100 MG PO CAPS
100.0000 mg | ORAL_CAPSULE | Freq: Two times a day (BID) | ORAL | Status: DC | PRN
  Administered 2019-03-06: 100 mg via ORAL
  Filled 2019-03-01 (×2): qty 1

## 2019-03-01 MED ORDER — SERTRALINE HCL 50 MG PO TABS
100.0000 mg | ORAL_TABLET | Freq: Every day | ORAL | Status: DC
  Administered 2019-03-01 – 2019-03-05 (×5): 100 mg via ORAL
  Filled 2019-03-01 (×5): qty 2

## 2019-03-01 MED ORDER — ENOXAPARIN SODIUM 40 MG/0.4ML ~~LOC~~ SOLN
40.0000 mg | SUBCUTANEOUS | Status: DC
  Administered 2019-03-01 – 2019-03-03 (×3): 40 mg via SUBCUTANEOUS
  Filled 2019-03-01 (×3): qty 0.4

## 2019-03-01 MED ORDER — MORPHINE SULFATE (PF) 2 MG/ML IV SOLN
1.0000 mg | INTRAVENOUS | Status: DC | PRN
  Administered 2019-03-01 (×2): 1 mg via INTRAVENOUS
  Filled 2019-03-01 (×2): qty 1

## 2019-03-01 MED ORDER — KETOROLAC TROMETHAMINE 15 MG/ML IJ SOLN
15.0000 mg | Freq: Four times a day (QID) | INTRAMUSCULAR | Status: AC | PRN
  Administered 2019-03-02 – 2019-03-04 (×4): 15 mg via INTRAVENOUS
  Filled 2019-03-01 (×5): qty 1

## 2019-03-01 MED ORDER — POLYVINYL ALCOHOL 1.4 % OP SOLN
1.0000 [drp] | Freq: Two times a day (BID) | OPHTHALMIC | Status: DC | PRN
  Filled 2019-03-01: qty 15

## 2019-03-01 MED ORDER — SODIUM CHLORIDE 0.9 % IV SOLN: INTRAVENOUS | Status: DC

## 2019-03-01 MED ORDER — THYROID 60 MG PO TABS
75.0000 mg | ORAL_TABLET | Freq: Every day | ORAL | Status: DC
  Administered 2019-03-02: 75 mg via ORAL
  Filled 2019-03-01 (×3): qty 1

## 2019-03-01 MED ORDER — LORATADINE 10 MG PO TABS
10.0000 mg | ORAL_TABLET | Freq: Every day | ORAL | Status: DC
  Administered 2019-03-01 – 2019-03-06 (×6): 10 mg via ORAL
  Filled 2019-03-01 (×7): qty 1

## 2019-03-01 MED ORDER — CHLORHEXIDINE GLUCONATE 0.12 % MT SOLN
5.0000 mL | OROMUCOSAL | Status: DC
  Administered 2019-03-05: 5 mL via OROMUCOSAL
  Filled 2019-03-01: qty 15

## 2019-03-01 MED ORDER — ADULT MULTIVITAMIN W/MINERALS CH
ORAL_TABLET | Freq: Every day | ORAL | Status: DC
  Administered 2019-03-01 – 2019-03-06 (×6): 1 via ORAL
  Filled 2019-03-01 (×7): qty 1

## 2019-03-01 MED ORDER — AMLODIPINE BESYLATE 5 MG PO TABS
5.0000 mg | ORAL_TABLET | Freq: Every day | ORAL | Status: DC
  Administered 2019-03-01 – 2019-03-06 (×6): 5 mg via ORAL
  Filled 2019-03-01 (×7): qty 1

## 2019-03-01 MED ORDER — DONEPEZIL HCL 5 MG PO TABS
10.0000 mg | ORAL_TABLET | Freq: Every day | ORAL | Status: DC
  Administered 2019-03-01 – 2019-03-05 (×5): 10 mg via ORAL
  Filled 2019-03-01 (×6): qty 2

## 2019-03-01 MED ORDER — LABETALOL HCL 5 MG/ML IV SOLN: 20.0000 mg | INTRAVENOUS | Status: DC | PRN

## 2019-03-01 MED ORDER — VITAMIN D 25 MCG (1000 UNIT) PO TABS
1000.0000 [IU] | ORAL_TABLET | Freq: Every day | ORAL | Status: DC
  Administered 2019-03-01 – 2019-03-06 (×6): 1000 [IU] via ORAL
  Filled 2019-03-01 (×7): qty 1

## 2019-03-01 MED ORDER — HYDROCODONE-ACETAMINOPHEN 5-325 MG PO TABS
1.0000 | ORAL_TABLET | Freq: Once | ORAL | Status: AC
  Administered 2019-03-01: 1 via ORAL
  Filled 2019-03-01: qty 1

## 2019-03-01 MED ORDER — ONDANSETRON HCL 4 MG PO TABS: 4.0000 mg | ORAL_TABLET | Freq: Four times a day (QID) | ORAL | Status: DC | PRN

## 2019-03-01 MED ORDER — MAGNESIUM HYDROXIDE 400 MG/5ML PO SUSP: 30.0000 mL | Freq: Every day | ORAL | Status: DC | PRN

## 2019-03-01 MED ORDER — CALCIUM CARBONATE-VITAMIN D 500-200 MG-UNIT PO TABS
1.0000 | ORAL_TABLET | Freq: Two times a day (BID) | ORAL | Status: DC
  Administered 2019-03-01 – 2019-03-06 (×11): 1 via ORAL
  Filled 2019-03-01 (×14): qty 1

## 2019-03-01 MED ORDER — ACETAMINOPHEN 650 MG RE SUPP: 650.0000 mg | Freq: Four times a day (QID) | RECTAL | Status: DC | PRN

## 2019-03-01 MED ORDER — ASPIRIN EC 325 MG PO TBEC
325.0000 mg | DELAYED_RELEASE_TABLET | Freq: Every day | ORAL | Status: DC
  Administered 2019-03-01 – 2019-03-03 (×3): 325 mg via ORAL
  Filled 2019-03-01 (×3): qty 1

## 2019-03-01 MED ORDER — ONDANSETRON HCL 4 MG/2ML IJ SOLN
4.0000 mg | Freq: Once | INTRAMUSCULAR | Status: AC
  Administered 2019-03-01: 4 mg via INTRAVENOUS
  Filled 2019-03-01: qty 2

## 2019-03-01 MED ORDER — MORPHINE SULFATE (PF) 2 MG/ML IV SOLN
1.0000 mg | Freq: Once | INTRAVENOUS | Status: AC
  Administered 2019-03-01: 1 mg via INTRAVENOUS
  Filled 2019-03-01: qty 1

## 2019-03-01 NOTE — ED Provider Notes (Signed)
[facility] Emergency Department Provider Note   ____________________________________________   First MD Initiated Contact with Patient 03/01/19 [phone]     (approximate)  I have reviewed the triage vital signs and the nursing notes.   HISTORY  Chief Complaint Fall    HPI Sonya Carey is a 85 y.o. female brought to the ED via EMS from [facility] status post mechanical fall.  Patient tripped while ambulating to the restroom.  Struck her right chest and complains of rib pain.  Initially denied LOC but now is unsure.  Denies anticoagulant use.  Denies recent fever, cough, shortness of breath, abdominal pain, nausea or vomiting.      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[facility])    adenocarcinoma of transverse colon   Depression    Fracture, metacarpal 06/2016   5th metacarpal   History of chicken pox    Hypertension    Hypothyroidism    Macular degeneration    left eye   Thyroid disease     Patient Active Problem List   Diagnosis Date Noted   Multiple rib fractures 03/01/2019    Past Surgical History:  Procedure Laterality Date   CARPAL TUNNEL RELEASE  12/09/2010   CHOLECYSTECTOMY  1995   COLON SURGERY  02/25/2010   transverse colon resection   COLONOSCOPY     ESOPHAGOGASTRODUODENOSCOPY  02/25/2010   GANGLION CYST EXCISION Right 07/26/2016   Procedure: REMOVAL GANGLION OF WRIST;  Surgeon: Dereck Leep, MD;  Location: [facility] ORS;  Service: Orthopedics;  Laterality: Right;   GANGLION CYST EXCISION Right 11/10/2016   Procedure: REMOVAL GANGLION OF WRIST;  Surgeon: Dereck Leep, MD;  Location: [facility] ORS;  Service: Orthopedics;  Laterality: Right;   HERNIA REPAIR     MULTIPLE TOOTH EXTRACTIONS  06/2015   for dentures   UMBILICAL HERNIA REPAIR  02/25/2010    Prior to Admission medications   Medication Sig Start Date End Date Taking? Authorizing Provider  amLODipine (NORVASC) 5 MG tablet Take 5 mg by  mouth daily.    [provider]  aspirin 325 MG tablet Take 325 mg by mouth every morning.     [provider]  Calcium-Magnesium 500-250 MG TABS Take 2 tablets by mouth 2 (two) times daily.    [provider]  cetirizine (ZYRTEC) 10 MG tablet Take 10 mg by mouth daily.    [provider]  chlorhexidine (PERIDEX) 0.12 % solution as directed.    [provider]  cholecalciferol (VITAMIN D) 1000 units tablet Take 1,000 Units by mouth daily.    [provider]  donepezil (ARICEPT) 10 MG tablet Take 10 mg by mouth at bedtime.     [provider]  hydroxypropyl methylcellulose / hypromellose (ISOPTO TEARS / GONIOVISC) 2.5 % ophthalmic solution Place 1 drop into both eyes 2 (two) times daily as needed for dry eyes.    [provider]  Multiple Vitamins-Minerals (MULTIVITAMIN ADULT PO) Take 7 tablets by mouth daily. SHAKLEE "LIFE STRIP" MULTIVITAMIN PACK    [provider]  OVER THE COUNTER MEDICATION Take 1 tablet by mouth daily. [place]    [provider]  OVER THE COUNTER MEDICATION Take 2 tablets by mouth daily. [place]    [provider]  sertraline (ZOLOFT) 100 MG tablet Take 100 mg by mouth at bedtime.     [provider]  thyroid (ARMOUR) 30 MG tablet Take 75 mg by mouth daily before  breakfast.     [provider]  triamcinolone cream (KENALOG) 0.1 % Apply 1 application topically daily as needed (skin breakdown).    [provider]    Allergies Celebrex [celecoxib], Latex, Statins, and Adhesive [tape]  Family History  Problem Relation Age of Onset   Dementia Mother     Social History Social History   Tobacco Use   Smoking status: Former Smoker   Smokeless tobacco: Never Used  Substance Use Topics   Alcohol use: No   Drug use: No    Review of Systems  Constitutional: No fever/chills Eyes: No visual changes. ENT: No sore  throat. Cardiovascular: Positive for right rib pain. Respiratory: Denies shortness of breath. Gastrointestinal: No abdominal pain.  No nausea, no vomiting.  No diarrhea.  No constipation. Genitourinary: Negative for dysuria. Musculoskeletal: Negative for back pain. Skin: Negative for rash. Neurological: Negative for headaches, focal weakness or numbness.   ____________________________________________   PHYSICAL EXAM:  VITAL SIGNS: ED Triage Vitals  Enc Vitals Group     BP 03/01/19 0212 (!) 124/108     Pulse Rate 03/01/19 0214 65     Resp --      Temp 03/01/19 0213 97.9 F (36.6 C)     Temp Source 03/01/19 0213 Oral     SpO2 03/01/19 0214 95 %     Weight 03/01/19 0213 148 lb (67.1 kg)     Height 03/01/19 0213 5\' 3"  (1.6 m)     Head Circumference --      Peak Flow --      Pain Score 03/01/19 0213 10     Pain Loc --      Pain Edu? --      Excl. in [place]? --     Constitutional: Alert and oriented.  Elderly appearing and in mild acute distress. Eyes: Conjunctivae are normal. PERRL. EOMI. Head: Atraumatic. Nose: Atraumatic. Mouth/Throat: Mucous membranes are moist.  No dental malocclusion. Neck: No stridor.  No cervical spine tenderness to palpation. Cardiovascular: Normal rate, regular rhythm. Grossly normal heart sounds.  Good peripheral circulation. Respiratory: Normal respiratory effort.  No retractions. Lungs CTAB.  Right lower ribs point tender to palpation.  Mild splinting.  No crepitus. Gastrointestinal: Soft and nontender to light or deep palpation, particularly in right upper quadrant. No distention. No abdominal bruits. No CVA tenderness. Musculoskeletal: No lower extremity tenderness nor edema.  No joint effusions. Neurologic:  Normal speech and language. No gross focal neurologic deficits are appreciated.  Skin:  Skin is warm, dry and intact. No rash noted. Psychiatric: Mood and affect are normal. Speech and behavior are  normal.  ____________________________________________   LABS (all labs ordered are listed, but only abnormal results are displayed)  Labs Reviewed  CBC WITH DIFFERENTIAL/PLATELET - Abnormal; Notable for the following components:      Result Value   WBC 16.6 (*)    RBC 3.80 (*)    Hemoglobin 11.6 (*)    HCT 35.7 (*)    Neutro Abs 13.6 (*)    Abs Immature Granulocytes 0.14 (*)    All other components within normal limits  BASIC METABOLIC PANEL - Abnormal; Notable for the following components:   CO2 19 (*)    Glucose, Bld 143 (*)    Calcium 8.6 (*)    GFR calc non Af Amer 51 (*)    GFR calc Af Amer 59 (*)    All other components within normal limits  SARS CORONAVIRUS 2 (TAT 6-24 HRS)  TROPONIN  I (HIGH SENSITIVITY)   ____________________________________________  EKG  ED ECG REPORT I, Haley Roza J, the attending physician, personally viewed and interpreted this ECG.   Date: 03/01/2019  EKG Time: 0217  Rate: 70  Rhythm: normal EKG, normal sinus rhythm  Axis: Normal  Intervals:none  ST&T Change: Nonspecific  ____________________________________________  RADIOLOGY  ED MD interpretation: No ICH, 3-4 minimally displaced right anterior rib fractures with small apical pneumothorax  Official radiology report(s): DG Ribs Unilateral W/Chest Right  Result Date: 03/01/2019 CLINICAL DATA:  Fall with right rib pain. Trip 20 ambulating to the bathroom. EXAM: RIGHT RIBS AND CHEST - 3+ VIEW COMPARISON:  None. FINDINGS: Small right pneumothorax most prominent at the apices. Subcutaneous emphysema about the right chest wall extends into the right supraclavicular soft tissues minimally displaced fractures of right anterior 5, 6, and seventh ribs. Suspect additional lower anterior rib fracture. Hazy opacity at the right lung base may represent pleural fluid and atelectasis. Heart is normal in size. Aortic atherosclerosis. Advanced degenerative change of both shoulders. IMPRESSION: 1. Small  right pneumothorax. Subcutaneous emphysema about the right chest wall extending into the right supraclavicular soft tissues. 2. Minimally displaced right anterior 5, 6, and seventh rib fractures. Suspect additional lower anterior rib fracture that is displaced. 3. Hazy opacity at the right lung base may represent pleural fluid and atelectasis. Critical Value/emergent results were called by telephone at the time of interpretation on 03/01/2019 at 3:16 am to Dr Luvenia Starch Roxene Alviar , who verbally acknowledged these results. Electronically Signed   By: Keith Rake M.D.   On: 03/01/2019 03:16   CT Head Wo Contrast  Result Date: 03/01/2019 CLINICAL DATA:  Mechanical fall. Trip 20 ambulating to the bathroom. Question loss of consciousness. EXAM: CT HEAD WITHOUT CONTRAST TECHNIQUE: Contiguous axial images were obtained from the base of the skull through the vertex without intravenous contrast. COMPARISON:  Head CT 11/11/2017 FINDINGS: Brain: No intracranial hemorrhage, mass effect, or midline shift. Stable degree of atrophy. No hydrocephalus. The basilar cisterns are patent. Moderate chronic small vessel ischemia, unchanged. No evidence of territorial infarct or acute ischemia. No extra-axial or intracranial fluid collection. Vascular: Atherosclerosis of skullbase vasculature without hyperdense vessel or abnormal calcification. Skull: No fracture or focal lesion. Sinuses/Orbits: Trace fluid in right side of sphenoid sinus. Paranasal sinuses otherwise clear. Mastoid air cells well aerated. Bilateral ocular surgery. Other: None. IMPRESSION: 1. No acute intracranial abnormality. No skull fracture. 2. Stable atrophy and chronic small vessel ischemia. Electronically Signed   By: Keith Rake M.D.   On: 03/01/2019 03:24    ____________________________________________   PROCEDURES  Procedure(s) performed (including Critical Care):  Procedures   ____________________________________________   INITIAL IMPRESSION /  ASSESSMENT AND PLAN / ED COURSE  As part of my medical decision making, I reviewed the following data within the [place] notes reviewed and incorporated, EKG interpreted, Old chart reviewed, Radiograph reviewed, Notes from prior ED visits and [place] [employer]rolled Substance [place] was evaluated in Emergency Department on 03/01/2019 for the symptoms described in the history of present illness. She was evaluated in the context of the global COVID-19 pandemic, which necessitated consideration that the patient might be at risk for infection with the SARS-CoV-2 virus that causes COVID-19. Institutional protocols and algorithms that pertain to the evaluation of patients at risk for COVID-19 are in a state of rapid change based on information released by regulatory bodies including the CDC and federal and state organizations. These policies  and algorithms were followed during the patient's care in the ED.    85 year old female who presents with right rib pain status post mechanical fall.  Differential diagnosis includes but is not limited to rib contusion, fracture, pneumothorax, etc.  Will obtain x-rays of right ribs.  Will also obtain CT head as patient is now unsure if she suffered LOC.  Administer Norco for pain and reassess.   Clinical Course as of Feb 29 440  Thu Mar 01, 2019  0345 Spoke with Dr. Hampton Abbot from surgery regarding patient's rib fractures and small right apical pneumothorax.  Would not be a surgical candidate.  Agrees with repeating chest x-ray in 6 hours to assess pneumothorax.  No chest tube for now given patient's room air saturations while awake are 95%.  Will discuss with hospitalist services to evaluate patient in the emergency department for admission.   [JS]    Clinical Course User Index [JS] Paulette Blanch, MD     ____________________________________________   FINAL CLINICAL IMPRESSION(S) / ED DIAGNOSES  Final diagnoses:   Fall, initial encounter  Closed fracture of multiple ribs of right side, initial encounter  Traumatic pneumothorax, initial encounter     ED Discharge Orders    None       Note:  This document was prepared using Dragon voice recognition software and may include unintentional dictation errors.   Paulette Blanch, MD 03/01/19 [phone]

## 2019-03-01 NOTE — Consult Note (Signed)
[facility] SURGICAL CONSULTATION NOTE (initial) - [id number]   HISTORY OF PRESENT ILLNESS (HPI):  85 y.o. female presented to [facility] ED today for evaluation following mechanical fall. Patient is a resident of [facility] where she allegedly had a mechanical fall after tripping while attempted to go to the bathroom. She is unable to completely recall the event. She notes that she struck her right side and has had right sided chest pain since the fall. Does not believe she struck her head and no LOC reported. No other injuries reported. No abdominal pain, nausea, or emesis. No anticoagulant use. Work up in the ED was concerning for multiple right sided rib fractures and small apical pneumothorax. She was admitted to the medicine service for observation.    She continually notes throughout the history "I am waiting to hear about my transfer to [facility]." when asked about the details of this she notes "I don't know. Please just call my daughter. Her number is [phone]." When asked multiple members of the medical team, they are completely unaware of any discussion regarding transfer of this patient.   Surgery is consulted by hospitalist physician physician Dr. Eugenie Norrie, MD in this context for evaluation and management of right rib fracture and apical PTX s/p mechanical fall.   PAST MEDICAL HISTORY (PMH):  Past Medical History:  Diagnosis Date  . Colon cancer ([place])    adenocarcinoma of transverse colon  . Depression   . Fracture, metacarpal 06/2016   5th metacarpal  . History of chicken pox   . Hypertension   . Hypothyroidism   . Macular degeneration    left eye  . Thyroid disease      PAST SURGICAL HISTORY ([place]):  Past Surgical History:  Procedure Laterality Date  . CARPAL TUNNEL RELEASE  12/09/2010  . CHOLECYSTECTOMY  1995  . COLON SURGERY  02/25/2010   transverse colon resection  . COLONOSCOPY    . ESOPHAGOGASTRODUODENOSCOPY  02/25/2010  . GANGLION CYST  EXCISION Right 07/26/2016   Procedure: REMOVAL GANGLION OF WRIST;  Surgeon: Dereck Leep, MD;  Location: [facility] ORS;  Service: Orthopedics;  Laterality: Right;  . GANGLION CYST EXCISION Right 11/10/2016   Procedure: REMOVAL GANGLION OF WRIST;  Surgeon: Dereck Leep, MD;  Location: [facility] ORS;  Service: Orthopedics;  Laterality: Right;  . HERNIA REPAIR    . MULTIPLE TOOTH EXTRACTIONS  06/2015   for dentures  . UMBILICAL HERNIA REPAIR  02/25/2010     MEDICATIONS:  Prior to Admission medications   Medication Sig Start Date End Date Taking? Authorizing Provider  amLODipine (NORVASC) 5 MG tablet Take 5 mg by mouth daily.    [provider]  aspirin 325 MG tablet Take 325 mg by mouth every morning.     [provider]  Calcium-Magnesium 500-250 MG TABS Take 2 tablets by mouth 2 (two) times daily.    [provider]  cetirizine (ZYRTEC) 10 MG tablet Take 10 mg by mouth daily.    [provider]  chlorhexidine (PERIDEX) 0.12 % solution as directed.    [provider]  cholecalciferol (VITAMIN D) 1000 units tablet Take 1,000 Units by mouth daily.    [provider]  donepezil (ARICEPT) 10 MG tablet Take 10 mg by mouth at bedtime.     [provider]  hydroxypropyl methylcellulose / hypromellose (ISOPTO TEARS / GONIOVISC) 2.5 % ophthalmic solution Place 1 drop into both eyes 2 (two) times daily as needed for dry eyes.  [provider]  Multiple Vitamins-Minerals (MULTIVITAMIN ADULT PO) Take 7 tablets by mouth daily. SHAKLEE "LIFE STRIP" MULTIVITAMIN PACK    [provider]  OVER THE COUNTER MEDICATION Take 1 tablet by mouth daily. [place]    [provider]  OVER THE COUNTER MEDICATION Take 2 tablets by mouth daily. [place]    [provider]  sertraline (ZOLOFT) 100 MG tablet Take 100 mg by mouth at bedtime.     [provider]  thyroid (ARMOUR) 30 MG tablet Take 75 mg by  mouth daily before breakfast.     [provider]  triamcinolone cream (KENALOG) 0.1 % Apply 1 application topically daily as needed (skin breakdown).    [provider]     ALLERGIES:  Allergies  Allergen Reactions  . Celebrex [Celecoxib] Other (See Comments)    GI upset  . Latex Itching    NEGATIVE LATEX IgE (< 0.10) 11/04/2016  . Statins Other (See Comments)    Muscle pain  . Adhesive [Tape] Rash     SOCIAL HISTORY:  Social History   Socioeconomic History  . Marital status: Widowed    Spouse name: Not on file  . Number of children: Not on file  . Years of education: Not on file  . Highest education level: Not on file  Occupational History  . Not on file  Tobacco Use  . Smoking status: Former Research scientist (life sciences)  . Smokeless tobacco: Never Used  Substance and Sexual Activity  . Alcohol use: No  . Drug use: No  . Sexual activity: Not on file  Other Topics Concern  . Not on file  Social History Narrative  . Not on file   Social Determinants of Health   Financial Resource Strain:   . Difficulty of Paying Living Expenses: Not on file  Food Insecurity:   . Worried About Charity fundraiser in the Last Year: Not on file  . Ran Out of Food in the Last Year: Not on file  Transportation Needs:   . Lack of Transportation (Medical): Not on file  . Lack of Transportation (Non-Medical): Not on file  Physical Activity:   . Days of Exercise per Week: Not on file  . Minutes of Exercise per Session: Not on file  Stress:   . Feeling of Stress : Not on file  Social Connections:   . Frequency of Communication with Friends and Family: Not on file  . Frequency of Social Gatherings with Friends and Family: Not on file  . Attends Religious Services: Not on file  . Active Member of Clubs or Organizations: Not on file  . Attends Archivist Meetings: Not on file  . Marital Status: Not on file  Intimate Partner Violence:   . Fear of Current or Ex-Partner: Not on file   . Emotionally Abused: Not on file  . Physically Abused: Not on file  . Sexually Abused: Not on file     FAMILY HISTORY:  Family History  Problem Relation Age of Onset  . Dementia Mother       REVIEW OF SYSTEMS:  Review of Systems  Constitutional: Negative for chills and fever.  HENT: Negative for congestion, hearing loss and sore throat.   Respiratory: Negative for cough and shortness of breath.   Cardiovascular: Positive for chest pain (MSK). Negative for palpitations.  Gastrointestinal: Negative for abdominal pain, nausea and vomiting.  Musculoskeletal: Positive for falls.  Neurological: Negative for dizziness, loss of consciousness and headaches.  All other systems reviewed and are negative.   VITAL SIGNS:  Temp:  [97.9 F (36.6 C)] 97.9 F (36.6 C) (12/17 0213) Pulse Rate:  [65] 65 (12/17 0214) BP: (124)/(108) 124/108 (12/17 0212) SpO2:  [95 %] 95 % (12/17 0214) Weight:  [67.1 kg] 67.1 kg (12/17 0213)     Height: 5\' 3"  (160 cm) Weight: 67.1 kg BMI (Calculated): 26.22   INTAKE/OUTPUT:  No intake/output data recorded.  PHYSICAL EXAM:  Physical Exam Vitals and nursing note reviewed.  Constitutional:      Appearance: Normal appearance.  HENT:     Head: Normocephalic and atraumatic.  Eyes:     General: No scleral icterus.    Extraocular Movements: Extraocular movements intact.     Conjunctiva/sclera: Conjunctivae normal.     Pupils: Pupils are equal, round, and reactive to light.  Pulmonary:     Effort: Pulmonary effort is normal. No respiratory distress.     Breath sounds: Normal breath sounds.  Chest:     Chest wall: Tenderness present.     Comments: There is diffuse right lateral chest wall tenderness to even light palpation  Abdominal:     General: Abdomen is flat. There is no distension.     Tenderness: There is no abdominal tenderness. There is no guarding or rebound.  Genitourinary:    Comments: deferred Musculoskeletal:     Cervical back: Normal  range of motion. No rigidity or tenderness.  Skin:    General: Skin is warm and dry.  Neurological:     General: No focal deficit present.     Mental Status: She is alert. Mental status is at baseline.  Psychiatric:        Mood and Affect: Mood normal.        Behavior: Behavior normal.      Labs:  CBC Latest Ref Rng & Units 03/01/2019 11/11/2017 07/19/2016  WBC 4.0 - 10.5 K/uL 16.6(H) 6.4 5.0  Hemoglobin 12.0 - 15.0 g/dL 11.6(L) 12.2 14.5  Hematocrit 36.0 - 46.0 % 35.7(L) 33.9(L) 41.5  Platelets 150 - 400 K/uL 247 228 252   CMP Latest Ref Rng & Units 03/01/2019 11/11/2017 07/19/2016  Glucose 70 - 99 mg/dL 143(H) 118(H) 84  BUN 8 - 23 mg/dL 19 11 11   Creatinine 0.44 - 1.00 mg/dL 0.97 0.66 0.68  Sodium 135 - 145 mmol/L 138 134(L) 135  Potassium 3.5 - 5.1 mmol/L 4.0 4.1 3.9  Chloride 98 - 111 mmol/L 108 95(L) 100(L)  CO2 22 - 32 mmol/L 19(L) 29 27  Calcium 8.9 - 10.3 mg/dL 8.6(L) 9.3 9.3  Total Protein 6.5 - 8.1 g/dL - 6.8 -  Total Bilirubin 0.3 - 1.2 mg/dL - 0.6 -  Alkaline Phos 38 - 126 U/L - 81 -  AST 15 - 41 U/L - 27 -  ALT 0 - 44 U/L - 15 -     Imaging studies:   CXR (03/01/2019) personally reviewed showing multiple right rib fractures and right apical PTX, and radiologist report reviewed below:  IMPRESSION: 1. Small right pneumothorax. Subcutaneous emphysema about the right chest wall extending into the right supraclavicular soft tissues. 2. Minimally displaced right anterior 5, 6, and seventh rib fractures. Suspect additional lower anterior rib fracture that is displaced. 3. Hazy opacity at the right lung base may represent pleural fluid and atelectasis.   Assessment/Plan: (ICD-10's: W19.Merril Abbe, S31.41XA) 85 y.o. female with multiple right sided rib fractures and small right apical pneumothorax s/p mechanical fall at home, complicated by pertinent comorbidities including  advanced age.   - Admit to medicine for observation  - No indication for chest tube placement at  this time. We will repeat CXR this morning  - Pain control  - pulmonary toilet  - Monitor O2  - Further management per primary service.   - please note, if CXR remains stable there is no contraindication for transfer if that is what patient and her family prefer  All of the above findings and recommendations were discussed with the patient, and the medical team, and all of patient's questions were answered to her expressed satisfaction.  Thank you for the opportunity to participate in this patient's care.   -- Edison Simon, PA-C [facility] 03/01/2019, 7:44 AM [phone] M-F: 7am - 4pm

## 2019-03-01 NOTE — H&P (Addendum)
[facility] NAME: Sonya Carey    MR#:  [id number]  DATE OF BIRTH:  1924-11-15  DATE OF ADMISSION:  03/01/2019  PRIMARY CARE PHYSICIAN: Tracie Harrier, MD   REQUESTING/REFERRING PHYSICIAN: Lurline Hare, MD CHIEF COMPLAINT:   Chief Complaint  Patient presents with  . Fall  The patient was seen and examined on 03/01/2019  HISTORY OF PRESENT ILLNESS:  Sonya Carey  is a 85 y.o. pleasant Caucasian female with a known history of colon cancer, hypertension, dyslipidemia and hypothyroidism, who presented to the emergency room with onset of right-sided chest pain after having an accidental mechanical fall.  The patient however does not remember clearly what happened.  She thinks that she tripped.  It is unlikely that she had syncope.  She had no head injuries and no hip pain but did fall on her right side and that involved the right shoulder.  She denies any fever or chills or cough or wheezing.  No dysuria, oliguria or hematuria or flank pain.  She denied any headache or dizziness or blurred vision or paresthesias or focal muscle weakness.  Upon presentation to the emergency room, blood pressure was 124/108 with otherwise normal vital signs. Labs are remarkable for leukocytosis 16.6 with neutrophilia and her COVID-19 test is currently pending.  Noncontrasted head CT scan revealed no acute intracranial abnormalities but did show atrophy and chronic small vessel ischemic changes.  Rib x-ray showed the following: 1. Small right pneumothorax. Subcutaneous emphysema about the right chest wall extending into the right supraclavicular soft tissues. 2. Minimally displaced right anterior 5, 6, and seventh rib fractures. Suspect additional lower anterior rib fracture that is displaced. 3. Hazy opacity at the right lung base may represent pleural fluid and atelectasis.  The patient was given 1 p.o. Norco as well as 1 mg IV morphine sulfate and 4 mg of IV  Zofran.  Contact was made with Dr. Hampton Abbot who did not believe that the patient needs chest tube placement at this time given the small size of the pneumothorax and recommend repeat chest x-ray in a.m.  She will be admitted to a medical monitored bed for further evaluation and management.  PAST MEDICAL HISTORY:   Past Medical History:  Diagnosis Date  . Colon cancer ([place])    adenocarcinoma of transverse colon  . Depression   . Fracture, metacarpal 06/2016   5th metacarpal  . History of chicken pox   . Hypertension   . Hypothyroidism   . Macular degeneration    left eye  . Thyroid disease     PAST SURGICAL HISTORY:   Past Surgical History:  Procedure Laterality Date  . CARPAL TUNNEL RELEASE  12/09/2010  . CHOLECYSTECTOMY  1995  . COLON SURGERY  02/25/2010   transverse colon resection  . COLONOSCOPY    . ESOPHAGOGASTRODUODENOSCOPY  02/25/2010  . GANGLION CYST EXCISION Right 07/26/2016   Procedure: REMOVAL GANGLION OF WRIST;  Surgeon: Dereck Leep, MD;  Location: [facility] ORS;  Service: Orthopedics;  Laterality: Right;  . GANGLION CYST EXCISION Right 11/10/2016   Procedure: REMOVAL GANGLION OF WRIST;  Surgeon: Dereck Leep, MD;  Location: [facility] ORS;  Service: Orthopedics;  Laterality: Right;  . HERNIA REPAIR    . MULTIPLE TOOTH EXTRACTIONS  06/2015   for dentures  . UMBILICAL HERNIA REPAIR  02/25/2010    SOCIAL HISTORY:   Social History   Tobacco Use  . Smoking status: Former Research scientist (life sciences)  . Smokeless tobacco: Never Used  Substance Use Topics  . Alcohol use: No    FAMILY HISTORY:   Family History  Problem Relation Age of Onset  . Dementia Mother     DRUG ALLERGIES:   Allergies  Allergen Reactions  . Celebrex [Celecoxib] Other (See Comments)    GI upset  . Latex Itching    NEGATIVE LATEX IgE (< 0.10) 11/04/2016  . Statins Other (See Comments)    Muscle pain  . Adhesive [Tape] Rash    REVIEW OF SYSTEMS:   ROS As per history of present illness. All pertinent  systems were reviewed above. Constitutional,  HEENT, cardiovascular, respiratory, GI, GU, musculoskeletal, neuro, psychiatric, endocrine,  integumentary and hematologic systems were reviewed and are otherwise  negative/unremarkable except for positive findings mentioned above in the HPI.   MEDICATIONS AT HOME:   Prior to Admission medications   Medication Sig Start Date End Date Taking? Authorizing Provider  amLODipine (NORVASC) 5 MG tablet Take 5 mg by mouth daily.    [provider]  aspirin 325 MG tablet Take 325 mg by mouth every morning.     [provider]  Calcium-Magnesium 500-250 MG TABS Take 2 tablets by mouth 2 (two) times daily.    [provider]  cetirizine (ZYRTEC) 10 MG tablet Take 10 mg by mouth daily.    [provider]  chlorhexidine (PERIDEX) 0.12 % solution as directed.    [provider]  cholecalciferol (VITAMIN D) 1000 units tablet Take 1,000 Units by mouth daily.    [provider]  donepezil (ARICEPT) 10 MG tablet Take 10 mg by mouth at bedtime.     [provider]  hydroxypropyl methylcellulose / hypromellose (ISOPTO TEARS / GONIOVISC) 2.5 % ophthalmic solution Place 1 drop into both eyes 2 (two) times daily as needed for dry eyes.    [provider]  Multiple Vitamins-Minerals (MULTIVITAMIN ADULT PO) Take 7 tablets by mouth daily. SHAKLEE "LIFE STRIP" MULTIVITAMIN PACK    [provider]  OVER THE COUNTER MEDICATION Take 1 tablet by mouth daily. [place]    [provider]  OVER THE COUNTER MEDICATION Take 2 tablets by mouth daily. [place]    [provider]  sertraline (ZOLOFT) 100 MG tablet Take 100 mg by mouth at bedtime.     [provider]  thyroid (ARMOUR) 30 MG tablet Take 75 mg by mouth daily before breakfast.     [provider]  triamcinolone cream (KENALOG) 0.1 % Apply 1 application topically daily as needed (skin  breakdown).    [provider]      VITAL SIGNS:  Blood pressure (!) 124/108, pulse 65, temperature 97.9 F (36.6 C), temperature source Oral, height 5\' 3"  (1.6 m), weight 67.1 kg, SpO2 95 %.  PHYSICAL EXAMINATION:  Physical Exam  GENERAL:  85 y.o.-year-old pleasant Caucasian female patient lying in the bed with no acute distress.  EYES: Pupils equal, round, reactive to light and accommodation. No scleral icterus. Extraocular muscles intact.  HEENT: Head atraumatic, normocephalic. Oropharynx and nasopharynx clear.  NECK:  Supple, no jugular venous distention. No thyroid enlargement, no tenderness.  LUNGS: Normal breath sounds bilaterally, no wheezing, rales,rhonchi or crepitation. No use of accessory muscles of respiration.  She had right sided chest wall tenderness. CARDIOVASCULAR: Regular rate and rhythm, S1, S2 normal. No murmurs, rubs, or gallops.  ABDOMEN: Soft, nondistended, nontender. Bowel sounds present. No organomegaly or mass.  EXTREMITIES: No pedal edema, cyanosis, or clubbing.  NEUROLOGIC: Cranial nerves  II through XII are intact. Muscle strength 5/5 in all extremities. Sensation intact. Gait not checked.  PSYCHIATRIC: The patient is alert and oriented x 3.  Normal affect and good eye contact. SKIN: No obvious rash, lesion, or ulcer.   LABORATORY PANEL:   CBC Recent Labs  Lab 03/01/19 0216  WBC 16.6*  HGB 11.6*  HCT 35.7*  PLT 247   ------------------------------------------------------------------------------------------------------------------  Chemistries  Recent Labs  Lab 03/01/19 0216  NA 138  K 4.0  CL 108  CO2 19*  GLUCOSE 143*  BUN 19  CREATININE 0.97  CALCIUM 8.6*   ------------------------------------------------------------------------------------------------------------------  Cardiac Enzymes No results for input(s): TROPONINI in the last 168  hours. ------------------------------------------------------------------------------------------------------------------  RADIOLOGY:  DG Ribs Unilateral W/Chest Right  Result Date: 03/01/2019 CLINICAL DATA:  Fall with right rib pain. Trip 20 ambulating to the bathroom. EXAM: RIGHT RIBS AND CHEST - 3+ VIEW COMPARISON:  None. FINDINGS: Small right pneumothorax most prominent at the apices. Subcutaneous emphysema about the right chest wall extends into the right supraclavicular soft tissues minimally displaced fractures of right anterior 5, 6, and seventh ribs. Suspect additional lower anterior rib fracture. Hazy opacity at the right lung base may represent pleural fluid and atelectasis. Heart is normal in size. Aortic atherosclerosis. Advanced degenerative change of both shoulders. IMPRESSION: 1. Small right pneumothorax. Subcutaneous emphysema about the right chest wall extending into the right supraclavicular soft tissues. 2. Minimally displaced right anterior 5, 6, and seventh rib fractures. Suspect additional lower anterior rib fracture that is displaced. 3. Hazy opacity at the right lung base may represent pleural fluid and atelectasis. Critical Value/emergent results were called by telephone at the time of interpretation on 03/01/2019 at 3:16 am to Dr Luvenia Starch SUNG , who verbally acknowledged these results. Electronically Signed   By: Keith Rake M.D.   On: 03/01/2019 03:16   CT Head Wo Contrast  Result Date: 03/01/2019 CLINICAL DATA:  Mechanical fall. Trip 20 ambulating to the bathroom. Question loss of consciousness. EXAM: CT HEAD WITHOUT CONTRAST TECHNIQUE: Contiguous axial images were obtained from the base of the skull through the vertex without intravenous contrast. COMPARISON:  Head CT 11/11/2017 FINDINGS: Brain: No intracranial hemorrhage, mass effect, or midline shift. Stable degree of atrophy. No hydrocephalus. The basilar cisterns are patent. Moderate chronic small vessel ischemia,  unchanged. No evidence of territorial infarct or acute ischemia. No extra-axial or intracranial fluid collection. Vascular: Atherosclerosis of skullbase vasculature without hyperdense vessel or abnormal calcification. Skull: No fracture or focal lesion. Sinuses/Orbits: Trace fluid in right side of sphenoid sinus. Paranasal sinuses otherwise clear. Mastoid air cells well aerated. Bilateral ocular surgery. Other: None. IMPRESSION: 1. No acute intracranial abnormality. No skull fracture. 2. Stable atrophy and chronic small vessel ischemia. Electronically Signed   By: Keith Rake M.D.   On: 03/01/2019 03:24      IMPRESSION AND PLAN:   1.  Multiple right-sided rib fractures with subsequent right-sided chest pain and traumatic small right apical pneumothorax secondary to mechanical fall.  The patient will be admitted to a medical monitored bed.  Pain management will be provided with as needed IV Toradol and morphine sulfate as well as p.o. Percocet.  She will be followed with repeat chest x-ray in a.m.  Dr. Hampton Abbot will be consulted to follow with [place].  I notified him regarding the consult.  2.  Essential hypertension initially with elevated diastolic blood pressure likely secondary to pain.  We will continue Norvasc.  We will place her on as needed IV  labetalol.  3.  Hypothyroidism.  We will continue her thyroid replacement and check TSH.  4.  Depression.  We will continue her Zoloft.  5.  DVT prophylaxis.  Subtenons Lovenox   All the records are reviewed and case discussed with ED provider. The plan of care was discussed in details with the patient (and family). I answered all questions. The patient agreed to proceed with the above mentioned plan. Further management will depend upon hospital course.   CODE STATUS: I discussed the CODE STATUS with the patient and she desires to be DNI only.  She should also have CPR and DC shock as well as resuscitations with medications.  TOTAL TIME TAKING  CARE OF THIS PATIENT: 50 minutes.    Christel Mormon M.D on 03/01/2019 at 6:10 AM  [facility]   From 7 PM-7 AM, contact night-coverage [email or web address]  CC: Primary care physician; Tracie Harrier, MD   Note: This dictation was prepared with Dragon dictation along with smaller phrase technology. Any transcriptional errors that result from this process are unintentional.

## 2019-03-01 NOTE — ED Notes (Signed)
Pt asleep and noticed to be 89% on room air. Pt awoke when this RN entered room and oxygen saturation came up to 93% on room air. Pt placed on 2L nasal cannula per verbal order MD Beather Arbour. Oxygen saturation now 100% on 2L.

## 2019-03-01 NOTE — ED Triage Notes (Signed)
Pt presents from [facility] with c/o mechanical fall. Pain in right rib area post fall. Pt states she tripped when ambulating to bathroom. Pt denies LOC or blood thinner use.

## 2019-03-01 NOTE — Progress Notes (Signed)
Sonya Carey  is a 85 y.o. pleasant Caucasian female with a known history of colon cancer, hypertension, dyslipidemia and hypothyroidism, who presented to the emergency room with onset of right-sided chest pain after having an accidental mechanical fall.  The patient however does not remember clearly what happened.  She thinks that she tripped.  It is unlikely that she had syncope.  She had no head injuries and no hip pain but did fall on her right side and that involved the right shoulder.  She denies any fever or chills or cough or wheezing.  No dysuria, oliguria or hematuria or flank pain.  She denied any headache or dizziness or blurred vision or paresthesias or focal muscle weakness.   A/P: Chart reviewed.  Continue following plan and care -Chest x-ray this morning showed a stable pneumothorax. -Surgery recommended no interventions warranted at this point. -To incentive spirometry, pulmonary toileting and pain medication -Spoke with daughter -Patient letter [facility] she is okay staying at the [facility] and receiving treatment as needed -We will get PT OT -Bowel regimen -Encourage p.o. hydration  1.  Multiple right-sided rib fractures with subsequent right-sided chest pain and traumatic small right apical pneumothorax secondary to mechanical fall.  The patient will be admitted to a medical monitored bed.  Pain management will be provided with as needed IV Toradol and morphine sulfate as well as p.o. Percocet.  She will be followed with repeat chest x-ray in a.m.  Dr. Hampton Abbot will be consulted to follow with [place].  I notified him regarding the consult.  2.  Essential hypertension initially with elevated diastolic blood pressure likely secondary to pain.  We will continue Norvasc.  We will place her on as needed IV labetalol.  3.  Hypothyroidism.  We will continue her thyroid replacement and check TSH.  4.  Depression.  We will continue her Zoloft.  5.  DVT prophylaxis.  Subtenons Lovenox

## 2019-03-02 ENCOUNTER — Inpatient Hospital Stay: Payer: Medicare Other

## 2019-03-02 LAB — CBC
HCT: 30.2 % — ABNORMAL LOW (ref 36.0–46.0)
Hemoglobin: 9.9 g/dL — ABNORMAL LOW (ref 12.0–15.0)
MCH: 30 pg (ref 26.0–34.0)
MCHC: 32.8 g/dL (ref 30.0–36.0)
MCV: 91.5 fL (ref 80.0–100.0)
Platelets: 180 10*3/uL (ref 150–400)
RBC: 3.3 MIL/uL — ABNORMAL LOW (ref 3.87–5.11)
RDW: 13 % (ref 11.5–15.5)
WBC: 7.1 10*3/uL (ref 4.0–10.5)
nRBC: 0 % (ref 0.0–0.2)

## 2019-03-02 LAB — BASIC METABOLIC PANEL
Anion gap: 7 (ref 5–15)
BUN: 15 mg/dL (ref 8–23)
CO2: 28 mmol/L (ref 22–32)
Calcium: 8.9 mg/dL (ref 8.9–10.3)
Chloride: 103 mmol/L (ref 98–111)
Creatinine, Ser: 0.67 mg/dL (ref 0.44–1.00)
GFR calc Af Amer: >60 mL/min (ref >60)
GFR calc non Af Amer: >60 mL/min (ref >60)
Glucose, Bld: 97 mg/dL (ref 70–99)
Potassium: 4.1 mmol/L (ref 3.5–5.1)
Sodium: 138 mmol/L (ref 135–145)

## 2019-03-02 LAB — T4, FREE: Free T4: 0.96 ng/dL (ref 0.61–1.12)

## 2019-03-02 MED ORDER — AZITHROMYCIN 250 MG PO TABS
250.0000 mg | ORAL_TABLET | Freq: Every day | ORAL | Status: AC
  Administered 2019-03-03 – 2019-03-06 (×4): 250 mg via ORAL
  Filled 2019-03-02 (×5): qty 1

## 2019-03-02 MED ORDER — LIDOCAINE 5 % EX PTCH
1.0000 | MEDICATED_PATCH | CUTANEOUS | Status: DC
  Administered 2019-03-02 – 2019-03-06 (×5): 1 via TRANSDERMAL
  Filled 2019-03-02 (×5): qty 1

## 2019-03-02 MED ORDER — LIDOCAINE 5 % EX PTCH: 1.0000 | MEDICATED_PATCH | CUTANEOUS | Status: DC

## 2019-03-02 MED ORDER — AZITHROMYCIN 500 MG PO TABS
500.0000 mg | ORAL_TABLET | Freq: Every day | ORAL | Status: AC
  Administered 2019-03-02: 500 mg via ORAL
  Filled 2019-03-02: qty 1

## 2019-03-02 NOTE — NC FL2 (Signed)
[place] LEVEL OF CARE SCREENING TOOL     IDENTIFICATION  Patient Name: Sonya Carey Birthdate: 02/09/1925 Sex: female Admission Date (Current Location): 03/01/2019  [place] and [place] Number:  Engineering geologist and Address:  [facility], [address], [place], [place] [address]      Provider Number: [id number]  Attending Physician Name and Address:  Thornell Mule, MD  Relative Name and Phone Number:  Leslie Dales  [phone]    Current Level of Care: Hospital Recommended Level of Care: [place] Prior Approval Number:    Date Approved/Denied:   PASRR Number: [id number]  Discharge Plan: SNF    Current Diagnoses: Patient Active Problem List   Diagnosis Date Noted  . Multiple rib fractures 03/01/2019  . DNI (do not intubate) 03/01/2019    Orientation RESPIRATION BLADDER Height & Weight     Self, Situation, Place  Normal Continent Weight: 67.1 kg Height:  5\' 3"  (160 cm)  BEHAVIORAL SYMPTOMS/MOOD NEUROLOGICAL BOWEL NUTRITION STATUS      Continent Diet  AMBULATORY STATUS COMMUNICATION OF NEEDS Skin   Extensive Assist Verbally Normal                       Personal Care Assistance Level of Assistance  Dressing, Bathing Bathing Assistance: Maximum assistance   Dressing Assistance: Maximum assistance     Functional Limitations Info             SPECIAL CARE FACTORS FREQUENCY  PT (By licensed PT), OT (By licensed OT)     PT Frequency: 5x a week OT Frequency: 5x a week            Contractures Contractures Info: Not present    Additional Factors Info  Code Status Code Status Info: Partial             Current Medications (03/02/2019):  This is the current hospital active medication list Current Facility-Administered Medications  Medication Dose Route Frequency Provider Last Rate Last Admin  . 0.9 %  sodium chloride infusion   Intravenous Continuous Mansy, Jan A, MD       . acetaminophen (TYLENOL) tablet 650 mg  650 mg Oral Q6H PRN Mansy, Jan A, MD       Or  . acetaminophen (TYLENOL) suppository 650 mg  650 mg Rectal Q6H PRN Mansy, Jan A, MD      . amLODipine (NORVASC) tablet 5 mg  5 mg Oral Daily Thornell Mule, MD   5 mg at 03/02/19 0949  . aspirin EC tablet 325 mg  325 mg Oral Daily Mansy, Jan A, MD   325 mg at 03/02/19 0949  . azithromycin (ZITHROMAX) tablet 500 mg  500 mg Oral Daily Thornell Mule, MD       Followed by  . [START ON 03/03/2019] azithromycin (ZITHROMAX) tablet 250 mg  250 mg Oral Daily Thornell Mule, MD      . calcium-vitamin D (OSCAL WITH D) 500-200 MG-UNIT per tablet 1 tablet  1 tablet Oral BID Mansy, Arvella Merles, MD   1 tablet at 03/02/19 0949  . chlorhexidine (PERIDEX) 0.12 % solution 5 mL  5 mL Mouth/Throat UD Mansy, Jan A, MD      . cholecalciferol (VITAMIN D3) tablet 1,000 Units  1,000 Units Oral Daily Mansy, Arvella Merles, MD   1,000 Units at 03/02/19 0949  . docusate sodium (COLACE) capsule 100 mg  100 mg Oral BID PRN Thornell Mule, MD      .  donepezil (ARICEPT) tablet 10 mg  10 mg Oral QHS Mansy, Jan A, MD   10 mg at 03/01/19 2125  . enoxaparin (LOVENOX) injection 40 mg  40 mg Subcutaneous Q24H Mansy, Jan A, MD   40 mg at 03/01/19 2126  . ketorolac (TORADOL) 15 MG/ML injection 15 mg  15 mg Intravenous Q6H PRN Mansy, Jan A, MD   15 mg at 03/02/19 1157  . labetalol (NORMODYNE) injection 20 mg  20 mg Intravenous Q3H PRN Mansy, Jan A, MD      . lidocaine (LIDODERM) 5 % 1 patch  1 patch Transdermal Q24H Thornell Mule, MD   1 patch at 03/02/19 1455  . loratadine (CLARITIN) tablet 10 mg  10 mg Oral Daily Mansy, Jan A, MD   10 mg at 03/02/19 0949  . magnesium hydroxide (MILK OF MAGNESIA) suspension 30 mL  30 mL Oral Daily PRN Mansy, Jan A, MD      . morphine 2 MG/ML injection 1 mg  1 mg Intravenous Q4H PRN Mansy, Jan A, MD   1 mg at 03/01/19 1207  . multivitamin with minerals tablet   Oral Daily Mansy, Arvella Merles, MD   1 tablet at 03/02/19 0949  .  ondansetron (ZOFRAN) tablet 4 mg  4 mg Oral Q6H PRN Mansy, Jan A, MD       Or  . ondansetron [facility]) injection 4 mg  4 mg Intravenous Q6H PRN Mansy, Jan A, MD      . oxyCODONE (Oxy IR/ROXICODONE) immediate release tablet 5 mg  5 mg Oral Q4H PRN Mansy, Jan A, MD   5 mg at 03/02/19 [id number]  . polyvinyl alcohol (LIQUIFILM TEARS) 1.4 % ophthalmic solution 1 drop  1 drop Both Eyes BID PRN Mansy, Jan A, MD      . sertraline (ZOLOFT) tablet 100 mg  100 mg Oral QHS Mansy, Jan A, MD   100 mg at 03/01/19 2125  . thyroid (ARMOUR) tablet 75 mg  75 mg Oral QAC breakfast Mansy, Jan A, MD   75 mg at 03/02/19 [id number]     Discharge Medications: Please see discharge summary for a list of discharge medications.  Relevant Imaging Results:  Relevant Lab Results:   Additional Information    Victorino Dike, RN

## 2019-03-02 NOTE — Progress Notes (Signed)
PT Cancellation Note  Patient Details Name: Sonya Carey MRN: [id number] DOB: August 24, 1924   Cancelled Treatment:    Reason Eval/Treat Not Completed: Medical issues which prohibited therapy Spoke with nurse this AM who reports that pt is talking on phone with family at this time but that until she can get an IV put back in and get some pain meds there is little chance of her doing anything.  Nursing reports that with even very minimal and cautious bed mobility pt is screaming out in pain and unable to help.  Will try back later if pt's pain in better controlled and can reasonably participate.   Kreg Shropshire, DPT 03/02/2019, 11:22 AM

## 2019-03-02 NOTE — Progress Notes (Signed)
Sonya Carey 85 y.o.pleasant Caucasian femalewith a known history of colon cancer, hypertension, dyslipidemia and hypothyroidism, who presented to the emergency room with onset of right-sided chest pain after having an accidental mechanical fall. The patient however does not remember clearly what happened. She thinks that she tripped. It is unlikely that she had syncope. She had no head injuries and no hip pain but did fall on her right side and that involved the right shoulder. She denies any fever or chills or cough or wheezing. No dysuria, oliguria or hematuria or flank pain. She denied any headache or dizziness or blurred vision or paresthesias or focal muscle weakness.  Subjective: Patient states she is having extreme pain on her chest wall.  Some dry cough.  Denies any fever.  Objective: Vital signs in last 24 hours: Temp:  [97.7 F (36.5 C)-98.3 F (36.8 C)] 98 F (36.7 C) (12/18 0739) Pulse Rate:  [72-85] 72 (12/18 0739) Resp:  [16-17] 17 (12/18 0739) BP: (107-143)/(46-65) 107/48 (12/18 0739) SpO2:  [97 %-99 %] 97 % (12/18 0739)  Intake/Output from previous day: No intake/output data recorded. Intake/Output this shift: No intake/output data recorded.  GENERAL:  85 y.o.-year-old pleasant Caucasian female patient lying in the bed with no acute distress.  EYES: Pupils equal, round, reactive to light and accommodation. No scleral icterus. Extraocular muscles intact.  HEENT: Head atraumatic, normocephalic. Oropharynx and nasopharynx clear.  NECK:  Supple, no jugular venous distention. No thyroid enlargement, no tenderness.  LUNGS: Normal breath sounds bilaterally, no wheezing, rales,rhonchi or crepitation. No use of accessory muscles of respiration.  She had right sided chest wall tenderness. CARDIOVASCULAR: Regular rate and rhythm, S1, S2 normal. No murmurs, rubs, or gallops.  ABDOMEN: Soft, nondistended, nontender. Bowel sounds present. No organomegaly or mass.   EXTREMITIES: No pedal edema, cyanosis, or clubbing.  NEUROLOGIC: Cranial nerves II through XII are intact. Muscle strength 5/5 in all extremities. Sensation intact. Gait not checked.  PSYCHIATRIC: The patient is alert and oriented x 3.  Normal affect and good eye contact. SKIN: No obvious rash, lesion, or ulcer.   Results for orders placed or performed during the hospital encounter of 03/01/19 (from the past 24 hour(s))  Basic metabolic panel     Status: None   Collection Time: 03/02/19  4:42 AM  Result Value Ref Range   Sodium 138 135 - 145 mmol/L   Potassium 4.1 3.5 - 5.1 mmol/L   Chloride 103 98 - 111 mmol/L   CO2 28 22 - 32 mmol/L   Glucose, Bld 97 70 - 99 mg/dL   BUN 15 8 - 23 mg/dL   Creatinine, Ser 0.67 0.44 - 1.00 mg/dL   Calcium 8.9 8.9 - 10.3 mg/dL   GFR calc non Af Amer >60 >60 mL/min   GFR calc Af Amer >60 >60 mL/min   Anion gap 7 5 - 15  CBC     Status: Abnormal   Collection Time: 03/02/19  4:42 AM  Result Value Ref Range   WBC 7.1 4.0 - 10.5 K/uL   RBC 3.30 (L) 3.87 - 5.11 MIL/uL   Hemoglobin 9.9 (L) 12.0 - 15.0 g/dL   HCT 30.2 (L) 36.0 - 46.0 %   MCV 91.5 80.0 - 100.0 fL   MCH 30.0 26.0 - 34.0 pg   MCHC 32.8 30.0 - 36.0 g/dL   RDW 13.0 11.5 - 15.5 %   Platelets 180 150 - 400 K/uL   nRBC 0.0 0.0 - 0.2 %  T4, free  Status: None   Collection Time: 03/02/19  4:42 AM  Result Value Ref Range   Free T4 0.96 0.61 - 1.12 ng/dL    Studies/Results: DG Chest 2 View  Result Date: 03/01/2019 CLINICAL DATA:  Acute onset right chest pain after a fall 03/01/2019. Right pneumothorax. EXAM: CHEST - 2 VIEW COMPARISON:  Plain film of the chest 03/01/2019 at 2:46 a.m. FINDINGS: Small right apical pneumothorax is again seen with some gas tracking into the soft tissues of the neck. Right pleural effusion has increased since the prior examination. Hazy interstitial opacities bilaterally are most suggestive of pulmonary edema. Heart size is upper normal. Atherosclerosis is noted.  Right rib fractures are again seen. Marked scoliosis noted. IMPRESSION: No change in a small right pneumothorax. Worsened aeration diffusely has an appearance most suggestive of pulmonary edema. Increased small right pleural effusion. Right rib fractures. Atherosclerosis. Electronically Signed   By: Inge Rise M.D.   On: 03/01/2019 09:29   DG Ribs Unilateral W/Chest Right  Result Date: 03/01/2019 CLINICAL DATA:  Fall with right rib pain. Trip 20 ambulating to the bathroom. EXAM: RIGHT RIBS AND CHEST - 3+ VIEW COMPARISON:  None. FINDINGS: Small right pneumothorax most prominent at the apices. Subcutaneous emphysema about the right chest wall extends into the right supraclavicular soft tissues minimally displaced fractures of right anterior 5, 6, and seventh ribs. Suspect additional lower anterior rib fracture. Hazy opacity at the right lung base may represent pleural fluid and atelectasis. Heart is normal in size. Aortic atherosclerosis. Advanced degenerative change of both shoulders. IMPRESSION: 1. Small right pneumothorax. Subcutaneous emphysema about the right chest wall extending into the right supraclavicular soft tissues. 2. Minimally displaced right anterior 5, 6, and seventh rib fractures. Suspect additional lower anterior rib fracture that is displaced. 3. Hazy opacity at the right lung base may represent pleural fluid and atelectasis. Critical Value/emergent results were called by telephone at the time of interpretation on 03/01/2019 at 3:16 am to Dr Luvenia Starch SUNG , who verbally acknowledged these results. Electronically Signed   By: Keith Rake M.D.   On: 03/01/2019 03:16   CT Head Wo Contrast  Result Date: 03/01/2019 CLINICAL DATA:  Mechanical fall. Trip 20 ambulating to the bathroom. Question loss of consciousness. EXAM: CT HEAD WITHOUT CONTRAST TECHNIQUE: Contiguous axial images were obtained from the base of the skull through the vertex without intravenous contrast. COMPARISON:  Head  CT 11/11/2017 FINDINGS: Brain: No intracranial hemorrhage, mass effect, or midline shift. Stable degree of atrophy. No hydrocephalus. The basilar cisterns are patent. Moderate chronic small vessel ischemia, unchanged. No evidence of territorial infarct or acute ischemia. No extra-axial or intracranial fluid collection. Vascular: Atherosclerosis of skullbase vasculature without hyperdense vessel or abnormal calcification. Skull: No fracture or focal lesion. Sinuses/Orbits: Trace fluid in right side of sphenoid sinus. Paranasal sinuses otherwise clear. Mastoid air cells well aerated. Bilateral ocular surgery. Other: None. IMPRESSION: 1. No acute intracranial abnormality. No skull fracture. 2. Stable atrophy and chronic small vessel ischemia. Electronically Signed   By: Keith Rake M.D.   On: 03/01/2019 03:24   DG Chest Port 1 View  Result Date: 03/02/2019 CLINICAL DATA:  Follow-up pneumothorax. EXAM: PORTABLE CHEST 1 VIEW COMPARISON:  03/01/2019. FINDINGS: Heart size normal. Diffuse right lung infiltrate with small right pleural effusion. Prominent thoracic spine scoliosis and degenerative change. Prominent degenerative changes both shoulders again noted. Surgical clips right upper quadrant. IMPRESSION: Diffuse right lung infiltrate with small right pleural effusion. No pneumothorax. Electronically Signed   By: Marcello Moores  Register   On: 03/02/2019 09:07    Scheduled Meds: . amLODipine  5 mg Oral Daily  . aspirin EC  325 mg Oral Daily  . azithromycin  500 mg Oral Daily   Followed by  . [START ON 03/03/2019] azithromycin  250 mg Oral Daily  . calcium-vitamin D  1 tablet Oral BID  . chlorhexidine  5 mL Mouth/Throat UD  . cholecalciferol  1,000 Units Oral Daily  . donepezil  10 mg Oral QHS  . enoxaparin (LOVENOX) injection  40 mg Subcutaneous Q24H  . lidocaine  1 patch Transdermal Q24H  . loratadine  10 mg Oral Daily  . multivitamin with minerals   Oral Daily  . sertraline  100 mg Oral QHS  .  thyroid  75 mg Oral QAC breakfast   Continuous Infusions: . sodium chloride     PRN Meds:acetaminophen **OR** acetaminophen, docusate sodium, ketorolac, labetalol, magnesium hydroxide, morphine injection, ondansetron **OR** ondansetron (ZOFRAN) IV, oxyCODONE, polyvinyl alcohol  Assessment/Plan: A/P: Chart reviewed.  Continue following plan and care -Chest x-ray this morning showed a stable pneumothorax. -Surgery recommended no interventions warranted at this point. -To incentive spirometry, pulmonary toileting and pain medication -Spoke with daughter -Patient letter [facility] she is okay staying at the [facility] and receiving treatment as needed -We will get PT OT -Bowel regimen -Encourage p.o. hydration  1. Multiple right-sided rib fractures with subsequent right-sided chest pain and traumatic small right apical pneumothorax secondary to mechanical fall.  - Chest x-ray this morning showed a stable pneumothorax. -Surgery recommended no interventions warranted at this point.  Appreciate surgery recommendation -To incentive spirometry, pulmonary toileting and pain medication -Chest x-ray on 03/02/2019 shows normal pneumothorax but infiltrates -Adequate pain management will be provided with as needed   2.  Right lung infiltrates: Appears to be worsened than initial x-ray -Also had white count elevation of 16.6 -COVID-19 tested negative -Blood pressure running slightly low -May start on Zithromax complete the course -May obtain blood culture  3. Essential hypertensioninitially with elevated diastolic blood pressure likely secondary to pain.  -Currently running low -Hold antihypertensive at this point  4. Hypothyroidism.We will continue her thyroid replacement and check TSH.  5. Depression. We will continue her Zoloft.  6. DVT prophylaxis. Subtenons Lovenox  Spoke with patient's daughter.  Answered all question.  Patient at one point while in the emergency wanted to be  transferred to [facility].  But later said she would be fine staying here.  Patient daughter is fine as well.   LOS: 1 day   Ezechiel Stooksbury Izetta Dakin

## 2019-03-02 NOTE — Evaluation (Signed)
Physical Therapy Evaluation Patient Details Name: Sonya Carey MRN: [id number] DOB: 10/16/1924 Today's Date: 03/02/2019   History of Present Illness  85 y/o F with PMH: colon cancer, HTN, DLD, and hypothyroidism. Presented with R sided chest pain after a mechanical fall. XR revealed small R pnrumothorax, minmally displaced 5, 6, 7th rib fxs.  Clinical Impression  Pt showed good effort and despite earlier limitations from pain did relatively well in this regard.  At rest she reported only "little pain" however with any and all mobility she had considerably more pain and associated functional limitations.  She showed good LE strength and also did well with UEs though this was pain limited.  She was pleasantly confused t/o the session and overall did well despite her inability to tolerate attempts at standing.  Pt will need increased level of assistance and PT is recommending STR before being able to safely return to her ALF.     Follow Up Recommendations SNF    Equipment Recommendations  None recommended by PT    Recommendations for Other Services       Precautions / Restrictions Precautions Precautions: Fall Restrictions Weight Bearing Restrictions: No      Mobility  Bed Mobility Overal bed mobility: Needs Assistance Bed Mobility: Supine to Sit;Sit to Supine     Supine to sit: Mod assist Sit to supine: Mod assist   General bed mobility comments: Pt made attempt to get to EOB w/o assist, ultimately needed considerable assist to and from supine  Transfers Overall transfer level: Needs assistance Equipment used: Rolling walker (2 wheeled) Transfers: Sit to/from Stand Sit to Stand: Max assist         General transfer comment: Pt uanble to rise to standing despite assist to get to EOB, get feet/UEs positioned appropriately as well as elevating bed height.  Pt with too much pain in trying to get forward and up.  Ambulation/Gait             General Gait Details:  unable  Stairs            Wheelchair Mobility    Modified Rankin (Stroke Patients Only)       Balance Overall balance assessment: Needs assistance Sitting-balance support: Bilateral upper extremity supported Sitting balance-Leahy Scale: Good Sitting balance - Comments: Pt was able to maintain sitting balance at EOB well, but clearly with discomfort (R rib pain)       Standing balance comment: unable to rise to standing                             Pertinent Vitals/Pain Pain Assessment: Faces Faces Pain Scale: Hurts even more Pain Location: R mid ribs    Home Living Family/patient expects to be discharged to:: Assisted living([facility]) Living Arrangements: (apartment - memory care unit)             Home Equipment: Environmental consultant - 2 wheels;Walker - 4 wheels      Prior Function Level of Independence: Independent with assistive device(s)         Comments: Pt's daughter reports that pt was able to ambulate and be active at [place]        Extremity/Trunk Assessment   Upper Extremity Assessment Upper Extremity Assessment: Generalized weakness;Overall [facility] for tasks assessed(age appropriate, limited testing 2/2 pain)    Lower Extremity Assessment Lower Extremity Assessment: Generalized weakness;Overall [facility] for tasks assessed(age appropriate)  Communication   Communication: No difficulties  Cognition Arousal/Alertness: Awake/alert Behavior During Therapy: WFL for tasks assessed/performed Overall Cognitive Status: History of cognitive impairments - at baseline                                        General Comments      Exercises     Assessment/Plan    PT Assessment Patient needs continued PT services  PT Problem List Decreased strength;Decreased range of motion;Decreased activity tolerance;Decreased balance;Decreased mobility;Pain;Decreased cognition;Decreased knowledge of use of DME;Decreased  safety awareness       PT Treatment Interventions DME instruction;Gait training;Stair training;Functional mobility training;Therapeutic activities;Therapeutic exercise;Balance training;Cognitive remediation;Patient/family education    PT Goals (Current goals can be found in the Care Plan section)  Acute Rehab PT Goals Patient Stated Goal: to go home PT Goal Formulation: With patient/family Time For Goal Achievement: 03/16/19 Potential to Achieve Goals: Fair    Frequency Min 2X/week   Barriers to discharge        Co-evaluation               AM-PAC PT "6 Clicks" Mobility  Outcome Measure Help needed turning from your back to your side while in a flat bed without using bedrails?: A Lot Help needed moving from lying on your back to sitting on the side of a flat bed without using bedrails?: A Lot Help needed moving to and from a bed to a chair (including a wheelchair)?: Total Help needed standing up from a chair using your arms (e.g., wheelchair or bedside chair)?: Total Help needed to walk in hospital room?: Total Help needed climbing 3-5 steps with a railing? : Total 6 Click Score: 8    End of Session Equipment Utilized During Treatment: Oxygen(2L - O2 remain in mid 90s t/o session) Activity Tolerance: Patient limited by pain Patient left: with bed alarm set;with call bell/phone within reach;with family/visitor present Nurse Communication: Mobility status PT Visit Diagnosis: Muscle weakness (generalized) (M62.81);Difficulty in walking, not elsewhere classified (R26.2);Pain Pain - Right/Left: Right Pain - part of body: (torso)    Time: [id number] PT Time Calculation (min) (ACUTE ONLY): 25 min   Charges:   PT Evaluation $PT Eval Low Complexity: 1 Low PT Treatments $Therapeutic Activity: 8-22 mins        Kreg Shropshire, DPT 03/02/2019, 6:39 PM

## 2019-03-02 NOTE — Evaluation (Signed)
Occupational Therapy Evaluation Patient Details Name: Sonya Carey MRN: [id number] DOB: 19-May-1924 Today's Date: 03/02/2019    History of Present Illness Pt is 85 y/o F with PMH: colon cancer, HTN, DLD, and hypothyroidism. Presented with R sided chest pain after a mechanical fall. XR revealed small R pnrumothorax, minmally displaced 5, 6, 7th rib fxs.   Clinical Impression   Pt seen for OT evaluation this date. Prior to hospital admission, pt was Indep with ADLs.  Pt lives in ALF that helped with med mgt and meals.  Currently pt demonstrates impairments in sitting and standing balance and tolerance, pain limiting ability to participate in ADL tranfers and generalized weakness requiring MIN A with UB ADLs, and MAX A with LB ADLs.  Pt would benefit from skilled OT to address noted impairments and functional limitations (see below for any additional details) in order to maximize safety and independence while minimizing falls risk and caregiver burden.  Upon hospital discharge, recommend pt discharge to SNF.    Follow Up Recommendations  SNF    Equipment Recommendations  Other (comment)(defer to next venue of care.)    Recommendations for Other Services       Precautions / Restrictions Precautions Precautions: Fall Restrictions Weight Bearing Restrictions: No      Mobility Bed Mobility Overal bed mobility: Needs Assistance Bed Mobility: Supine to Sit;Sit to Supine     Supine to sit: Min assist Sit to supine: Supervision   General bed mobility comments: increased time, HOB eleavted  Transfers                 General transfer comment: pt declines to particiapte at this time.    Balance Overall balance assessment: Mild deficits observed, not formally tested                                         ADL either performed or assessed with clinical judgement   ADL Overall ADL's : Needs assistance/impaired Eating/Feeding: Set up;Sitting   Grooming:  Wash/dry face;Set up;Sitting   Upper Body Bathing: Minimal assistance;Sitting   Lower Body Bathing: Maximal assistance;Sitting/lateral leans Lower Body Bathing Details (indicate cue type and reason): pt declines to attempt to stand during OT evaluation Upper Body Dressing : Minimal assistance;Sitting Upper Body Dressing Details (indicate cue type and reason): some UE limited ROM d/t rib pain limiting ability to dress I'ly at this time. Lower Body Dressing: Maximal assistance;Sitting/lateral leans Lower Body Dressing Details (indicate cue type and reason): declines to attempt to stand.   Toilet Transfer Details (indicate cue type and reason): unable to assess at this time.                 Vision Baseline Vision/History: Wears glasses Wears Glasses: At all times Patient Visual Report: No change from baseline       Perception     Praxis      Pertinent Vitals/Pain Pain Assessment: Faces Faces Pain Scale: Hurts whole lot Pain Location: R chast Pain Descriptors / Indicators: Aching Pain Intervention(s): Limited activity within patient's tolerance;Monitored during session     Hand Dominance     Extremity/Trunk Assessment Upper Extremity Assessment Upper Extremity Assessment: Generalized weakness   Lower Extremity Assessment Lower Extremity Assessment: Defer to PT evaluation;Generalized weakness       Communication Communication Communication: No difficulties   Cognition Arousal/Alertness: Awake/alert Behavior During Therapy: WFL for tasks assessed/performed Overall  Cognitive Status: Within Functional Limits for tasks assessed                                 General Comments: pt with some repetition, but appropriately follows all commands.   General Comments  standing balance could not be assessed on OT evaluation d/t pt declining to attempt CTS.    Exercises Other Exercises Other Exercises: OT facilitates education re: role of OT. Pt with moderate  reception.   Shoulder Instructions      Home Living Family/patient expects to be discharged to:: Assisted living([facility]) Living Arrangements: Alone                           Home Equipment: Walker - 2 wheels   Additional Comments: Pt states she performs all self care-dressing and bathing I'ly. States ALF provides meals and med mgt. States she was ambulating with MOD I.      Prior Functioning/Environment                   OT Problem List: Decreased strength;Decreased activity tolerance;Impaired balance (sitting and/or standing);Pain;Decreased knowledge of use of DME or AE      OT Treatment/Interventions: Self-care/ADL training;Therapeutic exercise;Energy conservation;DME and/or AE instruction;Therapeutic activities;Patient/family education;Balance training    OT Goals(Current goals can be found in the care plan section) Acute Rehab OT Goals Patient Stated Goal: to go home OT Goal Formulation: With patient Time For Goal Achievement: 03/16/19 Potential to Achieve Goals: Good  OT Frequency: Min 1X/week   Barriers to D/C:            Co-evaluation              AM-PAC OT "6 Clicks" Daily Activity     Outcome Measure Help from another person eating meals?: None Help from another person taking care of personal grooming?: A Little Help from another person toileting, which includes using toliet, bedpan, or urinal?: A Lot Help from another person bathing (including washing, rinsing, drying)?: A Lot Help from another person to put on and taking off regular upper body clothing?: A Little Help from another person to put on and taking off regular lower body clothing?: A Lot 6 Click Score: 16   End of Session Nurse Communication: Mobility status  Activity Tolerance: Patient limited by pain Patient left: in bed;with call bell/phone within reach;with bed alarm set  OT Visit Diagnosis: Muscle weakness (generalized) (M62.81);History of falling (Z91.81)                 Time: [id number] OT Time Calculation (min): 24 min Charges:  OT General Charges $OT Visit: 1 Visit OT Evaluation $OT Eval Moderate Complexity: 1 Mod OT Treatments $Self Care/Home Management : 8-22 mins  Gerrianne Scale, MS, OTR/L ascom [phone] 03/02/19, 1:58 PM

## 2019-03-02 NOTE — Progress Notes (Signed)
[facility] SURGICAL PROGRESS NOTE (cpt [phone])  Hospital Day(s): 1.   Interval History: Patient seen and examined, no acute events or new complaints overnight. Patient reports she continues to have right chest wall pain. This is worse with deep inspiration and she "feels like she cant take a deep breath." No additional complaints this morning. She did have a drop in her Hgb however I suspect this is mostly dilutional effect from IVF resuscitation. CXR stable this morning.   Review of Systems:  Constitutional: denies fever, chills  Respiratory: denies any shortness of breath  Cardiovascular: denies palpitations  Gastrointestinal: denies abdominal pain, N/V Musculoskeletal: + Chest wall pain   Vital signs in last 24 hours: [min-max] current  Temp:  [97.7 F (36.5 C)-98.3 F (36.8 C)] 98 F (36.7 C) (12/18 0739) Pulse Rate:  [72-85] 72 (12/18 0739) Resp:  [15-17] 17 (12/18 0739) BP: (107-157)/(46-65) 107/48 (12/18 0739) SpO2:  [97 %-100 %] 97 % (12/18 0739)     Height: 5\' 3"  (160 cm) Weight: 67.1 kg BMI (Calculated): 26.22   Intake/Output last 2 shifts:  No intake/output data recorded.   Physical Exam:  Constitutional: alert, cooperative and no distress  HENT: normocephalic without obvious abnormality  Eyes: PERRL, EOM's grossly intact and symmetric  Respiratory: breathing non-labored at rest, on [place]   Musculoskeletal: Tenderness to the right lateral chest wall   Labs:  CBC Latest Ref Rng & Units 03/02/2019 03/01/2019 11/11/2017  WBC 4.0 - 10.5 K/uL 7.1 16.6(H) 6.4  Hemoglobin 12.0 - 15.0 g/dL 9.9(L) 11.6(L) 12.2  Hematocrit 36.0 - 46.0 % 30.2(L) 35.7(L) 33.9(L)  Platelets 150 - 400 K/uL 180 247 228   CMP Latest Ref Rng & Units 03/02/2019 03/01/2019 11/11/2017  Glucose 70 - 99 mg/dL 97 143(H) 118(H)  BUN 8 - 23 mg/dL 15 19 11   Creatinine 0.44 - 1.00 mg/dL 0.67 0.97 0.66  Sodium 135 - 145 mmol/L 138 138 134(L)  Potassium 3.5 - 5.1 mmol/L 4.1 4.0 4.1   Chloride 98 - 111 mmol/L 103 108 95(L)  CO2 22 - 32 mmol/L 28 19(L) 29  Calcium 8.9 - 10.3 mg/dL 8.9 8.6(L) 9.3  Total Protein 6.5 - 8.1 g/dL - - 6.8  Total Bilirubin 0.3 - 1.2 mg/dL - - 0.6  Alkaline Phos 38 - 126 U/L - - 81  AST 15 - 41 U/L - - 27  ALT 0 - 44 U/L - - 15     Imaging studies:   CXR (03/02/2019) personally reviewed with Dr Hampton Abbot, rib fractures seen, does appear to have slight increase of right pleural effusion, very small if any right apical pneumothorax. Radiologist report pending.     Assessment/Plan: (ICD-10's: W19.Merril Abbe, S59.41XA) 85 y.o. female with multiple right sided rib fractures and improved small right apical pneumothorax s/p mechanical fall at home, complicated by pertinent comorbidities including advanced age.   - No indication for intervention or chest tube placement at this time; CXR appears improve aside from small right pleural effusion   - Continue adequate pain control  - O2 as needed, pulmonary toilet, aggressive IS use     - Further management per primary service; pending radiologist read this morning general surgery will sign off, continue plan outlined above, discharge once medically cleared   All of the above findings and recommendations were discussed with the patient, and the medical team, and all of patient's questions were answered to her expressed satisfaction.   -- Edison Simon, PA-C  [facility] 03/02/2019, 8:52 AM [phone] M-F:  7am - 4pm

## 2019-03-02 NOTE — TOC Initial Note (Signed)
Transition of Care [facility]) - Initial/Assessment Note    Patient Details  Name: Sonya Carey MRN: [id number] Date of Birth: [birth year]  [facility] CM/SW Contact:    Victorino Dike, RN Phone Number: 03/02/2019, 4:39 PM  Clinical Narrative:                   Expected Discharge Plan: Skilled Nursing Facility Barriers to Discharge: Continued Medical Work up   Patient Goals and CMS Choice  Met with Patient and Daughter to discuss discharge planning.  Patient current resident at [facility] [address].  Plan to discharge to [facility] (sister facility) .  Spoke with Seth Bake at [facility] and patient has a bed when d/c happens.  FL2 and PASSR completed.   Choice offered to / list presented to : Patient  Expected Discharge Plan and Services Expected Discharge Plan: [place]   Discharge Planning Services: CM Consult Post Acute Care Choice: Durable Medical Equipment Living arrangements for the past 2 months: [place]                                      Prior Living Arrangements/Services Living arrangements for the past 2 months: [place] Lives with:: Facility Resident Patient language and need for interpreter reviewed:: Yes Do you feel safe going back to the place where you live?: Yes      Need for Family Participation in Patient Care: Yes (Comment) Care giver support system in place?: Yes (comment)   Criminal Activity/Legal Involvement Pertinent to Current Situation/Hospitalization: No - Comment as needed  Activities of Daily Living Home Assistive Devices/Equipment: Gilford Rile (specify type) ADL Screening (condition at time of admission) Patient's cognitive ability adequate to safely complete daily activities?: Yes Is the patient deaf or have difficulty hearing?: Yes Does the patient have difficulty seeing, even when wearing glasses/contacts?: Yes Does the patient have difficulty concentrating,  remembering, or making decisions?: No Patient able to express need for assistance with ADLs?: No Does the patient have difficulty dressing or bathing?: No Independently performs ADLs?: Yes (appropriate for developmental age) Does the patient have difficulty walking or climbing stairs?: No Weakness of Legs: Both Weakness of Arms/Hands: None  Permission Sought/Granted                  Emotional Assessment Appearance:: Appears stated age Attitude/Demeanor/Rapport: Engaged Affect (typically observed): Accepting Orientation: : Oriented to Self, Oriented to Place, Oriented to Situation Alcohol / Substance Use: Not Applicable Psych Involvement: No (comment)  Admission diagnosis:  Multiple rib fractures [S22.49XA] Traumatic pneumothorax, initial encounter [S27.0XXA] Fall, initial encounter [id number]] Closed fracture of multiple ribs of right side, initial encounter [S22.41XA] Patient Active Problem List   Diagnosis Date Noted  . Multiple rib fractures 03/01/2019  . DNI (do not intubate) 03/01/2019   PCP:  Tracie Harrier, MD Pharmacy:   [facility] [place], [place] [address] [place] [address] Phone: [phone] Fax: [phone]     Social Determinants of Health (SDOH) Interventions    Readmission Risk Interventions No flowsheet data found.

## 2019-03-03 ENCOUNTER — Inpatient Hospital Stay: Payer: Medicare Other

## 2019-03-03 DIAGNOSIS — J939 Pneumothorax, unspecified: Secondary | ICD-10-CM

## 2019-03-03 DIAGNOSIS — W19XXXA Unspecified fall, initial encounter: Secondary | ICD-10-CM

## 2019-03-03 LAB — T3, FREE: T3, Free: 2.1 pg/mL (ref 2.0–4.4)

## 2019-03-03 MED ORDER — THYROID 60 MG PO TABS
100.0000 mg | ORAL_TABLET | Freq: Every day | ORAL | Status: DC
  Administered 2019-03-04: 105 mg via ORAL
  Administered 2019-03-05: 90 mg via ORAL
  Administered 2019-03-06: 105 mg via ORAL
  Filled 2019-03-03 (×3): qty 2

## 2019-03-03 NOTE — Plan of Care (Signed)
  Problem: Clinical Measurements: Goal: Ability to maintain clinical measurements within normal limits will improve Outcome: Progressing Goal: Will remain free from infection Outcome: Progressing Goal: Respiratory complications will improve Outcome: Progressing Goal: Cardiovascular complication will be avoided Outcome: Progressing   Problem: Activity: Goal: Risk for activity intolerance will decrease Outcome: Progressing   Problem: Nutrition: Goal: Adequate nutrition will be maintained Outcome: Progressing   Problem: Coping: Goal: Level of anxiety will decrease Outcome: Progressing   Problem: Elimination: Goal: Will not experience complications related to urinary retention Outcome: Progressing   Problem: Pain Managment: Goal: General experience of comfort will improve Outcome: Progressing   Problem: Skin Integrity: Goal: Risk for impaired skin integrity will decrease Outcome: Progressing

## 2019-03-03 NOTE — Progress Notes (Signed)
Per Dr. Reesa Chew, ok to discontinue fluids if patient is drinking

## 2019-03-03 NOTE — Progress Notes (Signed)
Physical Therapy Treatment Patient Details Name: Sonya Carey MRN: [id number] DOB: February 16, 1925 Today's Date: 03/03/2019    History of Present Illness 85 y/o F with PMH: colon cancer, HTN, DLD, and hypothyroidism. Presented with R sided chest pain after a mechanical fall. XR revealed small R pnrumothorax, minmally displaced 5, 6, 7th rib fxs.    PT Comments    Pt agrees with encouragement.  Supine to sit with max a x 1.  Pt with difficulty rolling and poor assist given due to rib pain.  Once sitting she is generally stable but she requires min guard at times as she will lean to left in pain.  She is able to stand today with min a x 1 and transfer to recliner then to commode as she feels the need to have a BM.  Overall improved transfers but pain remains limiting factor.  Pt requests time on commode.  Daughter in room and RN aware that they will call when finished for assist back to recliner.  Pt lives at [facility] and plan is to transfer to SNF for rehab per daughter.   Follow Up Recommendations  SNF     Equipment Recommendations       Recommendations for Other Services       Precautions / Restrictions Precautions Precautions: Fall Precaution Comments: R side rib fractures Restrictions Weight Bearing Restrictions: No    Mobility  Bed Mobility Overal bed mobility: Needs Assistance Bed Mobility: Supine to Sit     Supine to sit: Mod assist     General bed mobility comments: rib pain with moveemnt poor assist by pt due to pain,  Transfers Overall transfer level: Needs assistance Equipment used: Rolling walker (2 wheeled) Transfers: Sit to/from Stand Sit to Stand: Min assist         General transfer comment: overall improved today  min a to chair and commode with RW  Ambulation/Gait Ambulation/Gait assistance: Min assist Gait Distance (Feet): 3 Feet Assistive device: Rolling walker (2 wheeled) Gait Pattern/deviations: Step-to pattern;Narrow base of  support;Trunk flexed Gait velocity: decreased   General Gait Details: able to step to chair and commode with min a.   Stairs             Wheelchair Mobility    Modified Rankin (Stroke Patients Only)       Balance Overall balance assessment: Needs assistance Sitting-balance support: Bilateral upper extremity supported Sitting balance-Leahy Scale: Fair Sitting balance - Comments: leans to left due to pain.  occasional min a to correct.                                    Cognition Arousal/Alertness: Awake/alert Behavior During Therapy: WFL for tasks assessed/performed Overall Cognitive Status: History of cognitive impairments - at baseline                                        Exercises      General Comments        Pertinent Vitals/Pain Pain Assessment: Faces Faces Pain Scale: Hurts even more Pain Location: R mid ribs Pain Descriptors / Indicators: Aching Pain Intervention(s): Limited activity within patient's tolerance;Repositioned;Monitored during session    Home Living                      Prior Function  PT Goals (current goals can now be found in the care plan section) Progress towards PT goals: Progressing toward goals    Frequency    Min 2X/week      PT Plan Current plan remains appropriate    Co-evaluation              AM-PAC PT "6 Clicks" Mobility   Outcome Measure  Help needed turning from your back to your side while in a flat bed without using bedrails?: Total Help needed moving from lying on your back to sitting on the side of a flat bed without using bedrails?: A Lot Help needed moving to and from a bed to a chair (including a wheelchair)?: A Little Help needed standing up from a chair using your arms (e.g., wheelchair or bedside chair)?: A Little Help needed to walk in hospital room?: A Little Help needed climbing 3-5 steps with a railing? : Total 6 Click Score: 13     End of Session Equipment Utilized During Treatment: Oxygen Activity Tolerance: Patient tolerated treatment well;Patient limited by pain Patient left: Other (comment);with family/visitor present;with call bell/phone within reach Nurse Communication: Mobility status Pain - Right/Left: Right     Time: [id number] PT Time Calculation (min) (ACUTE ONLY): 18 min  Charges:  $Therapeutic Activity: 8-22 mins                    Chesley Noon, PTA 03/03/19, 1:30 PM

## 2019-03-03 NOTE — Progress Notes (Signed)
PROGRESS NOTE    Sonya Carey  [id number] DOB: June 25, 1924 DOA: 03/01/2019 PCP: Tracie Harrier, MD   Brief Narrative:  [name] a15 y.o.pleasant Caucasian femalewith a known history of colon cancer, hypertension, dyslipidemia and hypothyroidism, who presented to the emergency room with onset of right-sided chest pain after having an accidental mechanical fall. Found to have right-sided multiple rib fractures.  Subjective: Was feeling better when seen this morning.  She was accompanied by her daughter.  She was experiencing mild right chest pain, stating it is better than before.  Assessment & Plan:   Active Problems:   Multiple rib fractures   DNI (do not intubate)  Multiple right-sided rib fractures with subsequent right-sided chest pain and traumatic small right apical pneumothorax secondary to mechanical fall. Surgery is following-conservative management at this time. Repeat chest x-ray without any pneumothorax. -PT/OT recommending SNF placement. -Continue pain management.  Right pleural effusion/infiltrate.  Remain afebrile. Blood culture remain negative -Continue azithromycin to complete course.  Essential hypertension.  Currently normotensive. -Keep holding antihypertensives and monitor.  Hypothyroidism.  TSH elevated at 10.05, free T3 at lower normal and free T4 within normal limit. -Increase her thyroid supplement dose.  Depression. We will continue her Zoloft.  Objective: Vitals:   03/02/19 1538 03/03/19 0447 03/03/19 0805 03/03/19 1649  BP: (!) 95/44 (!) 137/53 (!) 118/50 (!) 122/59  Pulse: 74 88 80 79  Resp: 17 17 16 16   Temp: 98.9 F (37.2 C) 98.1 F (36.7 C) 98.8 F (37.1 C) 97.9 F (36.6 C)  TempSrc: Oral Oral  Oral  SpO2: 96% 96% 99% 98%  Weight:      Height:        Intake/Output Summary (Last 24 hours) at 03/03/2019 1854 Last data filed at 03/03/2019 1300 Gross per 24 hour  Intake 600 ml  Output --  Net 600 ml    Filed Weights   03/01/19 0213  Weight: 67.1 kg    Examination:  General exam: Appears calm and comfortable  Respiratory system: Clear to auscultation. Respiratory effort normal. Cardiovascular system: S1 & S2 heard, RRR. No JVD, murmurs, rubs, gallops or clicks. Gastrointestinal system: Soft, nontender, nondistended, bowel sounds positive. Central nervous system: Alert and oriented. No focal neurological deficits.Symmetric 5 x 5 power. Extremities: No edema, no cyanosis, pulses intact and symmetrical. Psychiatry: Judgement and insight appear normal. Mood & affect appropriate.    DVT prophylaxis: Lovenox Code Status: Partial Family Communication: Patient and daughter was updated at bedside. Disposition Plan: Pending SNF placement.  Consultants:   Surgery  Procedures:  Antimicrobials:  Azithromycin  Data Reviewed: I have personally reviewed following labs and imaging studies  CBC: Recent Labs  Lab 03/01/19 0216 03/02/19 0442  WBC 16.6* 7.1  NEUTROABS 13.6*  --   HGB 11.6* 9.9*  HCT 35.7* 30.2*  MCV 93.9 91.5  PLT 247 [id number]   Basic Metabolic Panel: Recent Labs  Lab 03/01/19 0216 03/02/19 0442  NA 138 138  K 4.0 4.1  CL 108 103  CO2 19* 28  GLUCOSE 143* 97  BUN 19 15  CREATININE 0.97 0.67  CALCIUM 8.6* 8.9   GFR: Estimated Creatinine Clearance: 41.3 mL/min (by C-G formula based on SCr of 0.67 mg/dL). Liver Function Tests: No results for input(s): AST, ALT, ALKPHOS, BILITOT, PROT, ALBUMIN in the last 168 hours. No results for input(s): LIPASE, AMYLASE in the last 168 hours. No results for input(s): AMMONIA in the last 168 hours. Coagulation Profile: No results for input(s): INR, PROTIME in the last  168 hours. Cardiac Enzymes: No results for input(s): CKTOTAL, CKMB, CKMBINDEX, TROPONINI in the last 168 hours. BNP (last 3 results) No results for input(s): PROBNP in the last 8760 hours. HbA1C: No results for input(s): HGBA1C in the last 72  hours. CBG: No results for input(s): GLUCAP in the last 168 hours. Lipid Profile: No results for input(s): CHOL, HDL, LDLCALC, TRIG, CHOLHDL, LDLDIRECT in the last 72 hours. Thyroid Function Tests: Recent Labs    03/01/19 1055 03/02/19 0442  TSH 10.059*  --   FREET4  --  0.96  T3FREE  --  2.1   Anemia Panel: No results for input(s): VITAMINB12, FOLATE, FERRITIN, TIBC, IRON, RETICCTPCT in the last 72 hours. Sepsis Labs: No results for input(s): PROCALCITON, LATICACIDVEN in the last 168 hours.  Recent Results (from the past 240 hour(s))  SARS CORONAVIRUS 2 (TAT 6-24 HRS) Nasopharyngeal Nasopharyngeal Swab     Status: None   Collection Time: 03/01/19  4:01 AM   Specimen: Nasopharyngeal Swab  Result Value Ref Range Status   SARS Coronavirus 2 NEGATIVE NEGATIVE Final    Comment: (NOTE) SARS-CoV-2 target nucleic acids are NOT DETECTED. The SARS-CoV-2 RNA is generally detectable in upper and lower respiratory specimens during the acute phase of infection. Negative results do not preclude SARS-CoV-2 infection, do not rule out co-infections with other pathogens, and should not be used as the sole basis for treatment or other patient management decisions. Negative results must be combined with clinical observations, patient history, and epidemiological information. The expected result is Negative. Fact Sheet for Patients: SugarRoll.be Fact Sheet for Healthcare Providers: https://www.woods-mathews.com/ This test is not yet approved or cleared by the [place] FDA and  has been authorized for detection and/or diagnosis of SARS-CoV-2 by FDA under an Emergency Use Authorization (EUA). This EUA will remain  in effect (meaning this test can be used) for the duration of the COVID-19 declaration under Section 56 4(b)(1) of the Act, 21 U.S.C. section 360bbb-3(b)(1), unless the authorization is terminated or revoked sooner. Performed at [facility], [place] [address] [place], [place] [address]   CULTURE, BLOOD (ROUTINE X 2) w Reflex to ID Panel     Status: None (Preliminary result)   Collection Time: 03/02/19  3:03 PM   Specimen: BLOOD  Result Value Ref Range Status   Specimen Description BLOOD RIGHT ASSIST CONTROL  Final   Special Requests   Final    BOTTLES DRAWN AEROBIC AND ANAEROBIC Blood Culture adequate volume   Culture   Final    NO GROWTH < 24 HOURS Performed at [facility], [address], [place], [place] [address]    Report Status PENDING  Incomplete  CULTURE, BLOOD (ROUTINE X 2) w Reflex to ID Panel     Status: None (Preliminary result)   Collection Time: 03/02/19  3:07 PM   Specimen: BLOOD  Result Value Ref Range Status   Specimen Description BLOOD RIGHT HAND  Final   Special Requests   Final    BOTTLES DRAWN AEROBIC AND ANAEROBIC Blood Culture adequate volume   Culture   Final    NO GROWTH < 24 HOURS Performed at [facility], [address] [place], [place] [address]    Report Status PENDING  Incomplete     Radiology Studies: DG Chest 2 View  Result Date: 03/03/2019 CLINICAL DATA:  Right-sided chest, known right apical pneumothorax from fall. EXAM: CHEST - 2 VIEW COMPARISON:  Twelve 17 in 03/02/2019 FINDINGS: Posteriorly layering right-sided pleural  effusion. Small amounts of subcutaneous emphysema along the right chest are diminished compared to the prior study. Cardiomediastinal contours are stable. Right sided pneumothorax not seen on today's study. Signs of basilar airspace disease as on the recent comparison. Glenohumeral degenerative changes are severe similar to prior exam, right-sided rib fractures not as well seen as on previous exams. IMPRESSION: 1. Moderate right effusion and basilar airspace disease similar to prior study. 2. No signs of pneumothorax on today's exam Electronically Signed   By: Zetta Bills M.D.   On: 03/03/2019 10:15   DG Chest Port 1 View  Result  Date: 03/02/2019 CLINICAL DATA:  Follow-up pneumothorax. EXAM: PORTABLE CHEST 1 VIEW COMPARISON:  03/01/2019. FINDINGS: Heart size normal. Diffuse right lung infiltrate with small right pleural effusion. Prominent thoracic spine scoliosis and degenerative change. Prominent degenerative changes both shoulders again noted. Surgical clips right upper quadrant. IMPRESSION: Diffuse right lung infiltrate with small right pleural effusion. No pneumothorax. Electronically Signed   By: Marcello Moores  Register   On: 03/02/2019 09:07    Scheduled Meds: . amLODipine  5 mg Oral Daily  . aspirin EC  325 mg Oral Daily  . azithromycin  250 mg Oral Daily  . calcium-vitamin D  1 tablet Oral BID  . chlorhexidine  5 mL Mouth/Throat UD  . cholecalciferol  1,000 Units Oral Daily  . donepezil  10 mg Oral QHS  . enoxaparin (LOVENOX) injection  40 mg Subcutaneous Q24H  . lidocaine  1 patch Transdermal Q24H  . loratadine  10 mg Oral Daily  . multivitamin with minerals   Oral Daily  . sertraline  100 mg Oral QHS  . [START ON 03/04/2019] thyroid  105 mg Oral QAC breakfast   Continuous Infusions:   LOS: 2 days   Time spent: 35 minutes.  Lorella Nimrod, MD [facility] Pager [phone]  If 7PM-7AM, please contact night-coverage [email or web address] Password [facility] 03/03/2019, 6:54 PM   This record has been created using Dragon voice recognition software. Errors have been sought and corrected,but may not always be located. Such creation errors do not reflect on the standard of care.

## 2019-03-04 ENCOUNTER — Inpatient Hospital Stay: Payer: Medicare Other

## 2019-03-04 ENCOUNTER — Encounter: Payer: Self-pay | Admitting: Family Medicine

## 2019-03-04 DIAGNOSIS — D509 Iron deficiency anemia, unspecified: Secondary | ICD-10-CM

## 2019-03-04 LAB — IRON AND TIBC
Iron: 28 ug/dL (ref 28–170)
Saturation Ratios: 9 % — ABNORMAL LOW (ref 10.4–31.8)
TIBC: 301 ug/dL (ref 250–450)
UIBC: 273 ug/dL

## 2019-03-04 LAB — RETICULOCYTES
Immature Retic Fract: 14.6 % (ref 2.3–15.9)
RBC.: 2.6 MIL/uL — ABNORMAL LOW (ref 3.87–5.11)
Retic Count, Absolute: 71.2 10*3/uL (ref 19.0–186.0)
Retic Ct Pct: 2.7 % (ref 0.4–3.1)

## 2019-03-04 LAB — CBC
HCT: 21.1 % — ABNORMAL LOW (ref 36.0–46.0)
Hemoglobin: 7.4 g/dL — ABNORMAL LOW (ref 12.0–15.0)
MCH: 30.2 pg (ref 26.0–34.0)
MCHC: 35.1 g/dL (ref 30.0–36.0)
MCV: 86.1 fL (ref 80.0–100.0)
Platelets: 157 10*3/uL (ref 150–400)
RBC: 2.45 MIL/uL — ABNORMAL LOW (ref 3.87–5.11)
RDW: 13 % (ref 11.5–15.5)
WBC: 8.5 10*3/uL (ref 4.0–10.5)
nRBC: 0 % (ref 0.0–0.2)

## 2019-03-04 LAB — FERRITIN: Ferritin: 43 ng/mL (ref 11–307)

## 2019-03-04 LAB — FOLATE: Folate: 25 ng/mL (ref >5.9)

## 2019-03-04 LAB — OCCULT BLOOD X 1 CARD TO LAB, STOOL: Fecal Occult Bld: NEGATIVE

## 2019-03-04 LAB — VITAMIN B12: Vitamin B-12: 397 pg/mL (ref 180–914)

## 2019-03-04 MED ORDER — SODIUM CHLORIDE 0.9 % IV SOLN
510.0000 mg | Freq: Once | INTRAVENOUS | Status: AC
  Administered 2019-03-04: 510 mg via INTRAVENOUS
  Filled 2019-03-04: qty 17

## 2019-03-04 MED ORDER — SODIUM CHLORIDE 0.9 % IV SOLN
INTRAVENOUS | Status: DC | PRN
  Administered 2019-03-04: 250 mL via INTRAVENOUS

## 2019-03-04 MED ORDER — IOHEXOL 300 MG/ML  SOLN
100.0000 mL | Freq: Once | INTRAMUSCULAR | Status: AC | PRN
  Administered 2019-03-04: 100 mL via INTRAVENOUS

## 2019-03-04 NOTE — Progress Notes (Signed)
PROGRESS NOTE    Sonya Carey  [id number] DOB: 10-Oct-1924 DOA: 03/01/2019 PCP: Tracie Harrier, MD   Brief Narrative:  Sonya Carey a56 y.o.pleasant Caucasian femalewith a known history of colon cancer, hypertension, dyslipidemia and hypothyroidism, who presented to the emergency room with onset of right-sided chest pain after having an accidental mechanical fall. Found to have right-sided multiple rib fractures.  Subjective: Patient was complaining of right-sided chest pain, increased with breathing.  She was also complaining of right flank pain.  Unable to describe pain well. Patient is very hard of hearing.  Denies any melena.  Denies any nausea or vomiting.  Assessment & Plan:   Active Problems:   Multiple rib fractures   DNI (do not intubate)   Fall   Pneumothorax  Multiple right-sided rib fractures with subsequent right-sided chest pain and traumatic small right apical pneumothorax secondary to mechanical fall. Surgery is following-conservative management at this time. Repeat chest x-ray without any pneumothorax. -PT/OT recommending SNF placement. -Continue pain management.  Acute anemia.  Hemoglobin dropped to 7.4 from 11.6 on admission.  No obvious sign of bleeding.  Anemia panel with iron deficiency.  Did CT chest to rule out hemothorax which shows a large right-sided pleural effusion with a small loculated pneumothorax.  Consulted surgery, talked with Dr. Celine Ahr who does not think that much pneumothorax is responsible for that big of drop.  She advised to monitor her breathing and conservative management. FOBT negative. -CT abdomen and pelvis to rule out any occult bleeding. -IV Feraheme. -Hold aspirin and Lovenox. -SCDs for DVT prophylaxis. -Monitor CBC.  Right pleural effusion/infiltrate.  Remain afebrile. Blood culture remain negative -Continue azithromycin to complete course.  Essential hypertension.  Currently normotensive. -Keep holding  antihypertensives and monitor.  Hypothyroidism.  TSH elevated at 10.05, free T3 at lower normal and free T4 within normal limit. -Increase her thyroid supplement dose.  Depression. We will continue her Zoloft.  Objective: Vitals:   03/03/19 0805 03/03/19 1649 03/04/19 0045 03/04/19 0809  BP: (!) 118/50 (!) 122/59 (!) 133/56 (!) 114/51  Pulse: 80 79 86 87  Resp: 16 16 16    Temp: 98.8 F (37.1 C) 97.9 F (36.6 C) 98.4 F (36.9 C) 98.3 F (36.8 C)  TempSrc:  Oral  Oral  SpO2: 99% 98% 93% 93%  Weight:      Height:        Intake/Output Summary (Last 24 hours) at 03/04/2019 1349 Last data filed at 03/03/2019 1900 Gross per 24 hour  Intake 240 ml  Output --  Net 240 ml   Filed Weights   03/01/19 0213  Weight: 67.1 kg    Examination:  General exam: Appears calm and comfortable  Respiratory system: Clear to auscultation. Respiratory effort normal. Cardiovascular system: S1 & S2 heard, RRR. No JVD, murmurs, rubs, gallops or clicks. Gastrointestinal system: Soft, nontender, nondistended, bowel sounds positive. Central nervous system: Alert and oriented. No focal neurological deficits.Symmetric 5 x 5 power. Extremities: No edema, no cyanosis, pulses intact and symmetrical. Psychiatry: Judgement and insight appear normal. Mood & affect appropriate.    DVT prophylaxis: SCDs. Code Status: Partial Family Communication: I updated the daughter on phone. Disposition Plan: Pending improvement and SNF placement.  Consultants:   Surgery  Procedures:  Antimicrobials:  Azithromycin  Data Reviewed: I have personally reviewed following labs and imaging studies  CBC: Recent Labs  Lab 03/01/19 0216 03/02/19 0442 03/04/19 0358  WBC 16.6* 7.1 8.5  NEUTROABS 13.6*  --   --   HGB 11.6*  9.9* 7.4*  HCT 35.7* 30.2* 21.1*  MCV 93.9 91.5 86.1  PLT 247 180 [id number]   Basic Metabolic Panel: Recent Labs  Lab 03/01/19 0216 03/02/19 0442  NA 138 138  K 4.0 4.1  CL 108 103  CO2  19* 28  GLUCOSE 143* 97  BUN 19 15  CREATININE 0.97 0.67  CALCIUM 8.6* 8.9   GFR: Estimated Creatinine Clearance: 41.3 mL/min (by C-G formula based on SCr of 0.67 mg/dL). Liver Function Tests: No results for input(s): AST, ALT, ALKPHOS, BILITOT, PROT, ALBUMIN in the last 168 hours. No results for input(s): LIPASE, AMYLASE in the last 168 hours. No results for input(s): AMMONIA in the last 168 hours. Coagulation Profile: No results for input(s): INR, PROTIME in the last 168 hours. Cardiac Enzymes: No results for input(s): CKTOTAL, CKMB, CKMBINDEX, TROPONINI in the last 168 hours. BNP (last 3 results) No results for input(s): PROBNP in the last 8760 hours. HbA1C: No results for input(s): HGBA1C in the last 72 hours. CBG: No results for input(s): GLUCAP in the last 168 hours. Lipid Profile: No results for input(s): CHOL, HDL, LDLCALC, TRIG, CHOLHDL, LDLDIRECT in the last 72 hours. Thyroid Function Tests: Recent Labs    03/02/19 0442  FREET4 0.96  T3FREE 2.1   Anemia Panel: Recent Labs    03/04/19 0851  FOLATE 25.0  FERRITIN 43  TIBC 301  IRON 28  RETICCTPCT 2.7   Sepsis Labs: No results for input(s): PROCALCITON, LATICACIDVEN in the last 168 hours.  Recent Results (from the past 240 hour(s))  SARS CORONAVIRUS 2 (TAT 6-24 HRS) Nasopharyngeal Nasopharyngeal Swab     Status: None   Collection Time: 03/01/19  4:01 AM   Specimen: Nasopharyngeal Swab  Result Value Ref Range Status   SARS Coronavirus 2 NEGATIVE NEGATIVE Final    Comment: (NOTE) SARS-CoV-2 target nucleic acids are NOT DETECTED. The SARS-CoV-2 RNA is generally detectable in upper and lower respiratory specimens during the acute phase of infection. Negative results do not preclude SARS-CoV-2 infection, do not rule out co-infections with other pathogens, and should not be used as the sole basis for treatment or other patient management decisions. Negative results must be combined with clinical  observations, patient history, and epidemiological information. The expected result is Negative. Fact Sheet for Patients: SugarRoll.be Fact Sheet for Healthcare Providers: https://www.woods-mathews.com/ This test is not yet approved or cleared by the [place] FDA and  has been authorized for detection and/or diagnosis of SARS-CoV-2 by FDA under an Emergency Use Authorization (EUA). This EUA will remain  in effect (meaning this test can be used) for the duration of the COVID-19 declaration under Section 56 4(b)(1) of the Act, 21 U.S.C. section 360bbb-3(b)(1), unless the authorization is terminated or revoked sooner. Performed at [facility], [place] [address], [place], [place] [address]   CULTURE, BLOOD (ROUTINE X 2) w Reflex to ID Panel     Status: None (Preliminary result)   Collection Time: 03/02/19  3:03 PM   Specimen: BLOOD  Result Value Ref Range Status   Specimen Description BLOOD RIGHT ASSIST CONTROL  Final   Special Requests   Final    BOTTLES DRAWN AEROBIC AND ANAEROBIC Blood Culture adequate volume   Culture   Final    NO GROWTH 2 DAYS Performed at [facility], [address] [place], [place] [address]    Report Status PENDING  Incomplete  CULTURE, BLOOD (ROUTINE X 2) w Reflex to ID Panel     Status: None (  Preliminary result)   Collection Time: 03/02/19  3:07 PM   Specimen: BLOOD  Result Value Ref Range Status   Specimen Description BLOOD RIGHT HAND  Final   Special Requests   Final    BOTTLES DRAWN AEROBIC AND ANAEROBIC Blood Culture adequate volume   Culture   Final    NO GROWTH 2 DAYS Performed at [facility], [address], [place],  [address]    Report Status PENDING  Incomplete     Radiology Studies: DG Chest 2 View  Result Date: 03/03/2019 CLINICAL DATA:  Right-sided chest, known right apical pneumothorax from fall. EXAM: CHEST - 2 VIEW COMPARISON:  Twelve 17 in 03/02/2019  FINDINGS: Posteriorly layering right-sided pleural effusion. Small amounts of subcutaneous emphysema along the right chest are diminished compared to the prior study. Cardiomediastinal contours are stable. Right sided pneumothorax not seen on today's study. Signs of basilar airspace disease as on the recent comparison. Glenohumeral degenerative changes are severe similar to prior exam, right-sided rib fractures not as well seen as on previous exams. IMPRESSION: 1. Moderate right effusion and basilar airspace disease similar to prior study. 2. No signs of pneumothorax on today's exam Electronically Signed   By: Zetta Bills M.D.   On: 03/03/2019 10:15   CT CHEST WO CONTRAST  Result Date: 03/04/2019 CLINICAL DATA:  Fall with right-sided rib fractures. Dropping hemoglobin. Concern for hemothorax. EXAM: CT CHEST WITHOUT CONTRAST TECHNIQUE: Multidetector CT imaging of the chest was performed following the standard protocol without IV contrast. COMPARISON:  Chest x-ray from yesterday. FINDINGS: Cardiovascular: Normal heart size. No pericardial effusion. No thoracic aortic aneurysm. Coronary, aortic arch, and branch vessel atherosclerotic vascular disease. Dense atherosclerotic calcification of the mitral annulus. Normal caliber central pulmonary arteries. Mediastinum/Nodes: No enlarged mediastinal or axillary lymph nodes. 1.5 cm left and 1.0 cm right hypodense thyroid nodules. No follow-up recommended unless clinically warranted. Trachea and esophagus demonstrate no significant findings. Lungs/Pleura: Large right pleural effusion. The majority of the fluid is simple in attenuation, however there is a small 1.9 x 2.2 cm area of hyperdensity along the inferolateral margin of the effusion (series 2, image 114), consistent with hemorrhage. Adjacent partial collapse of the right lower lobe. No pneumothorax. Mild paraseptal emphysema. There are several tiny pulmonary nodules in both lungs measuring up to 3 mm. No  consolidation. Upper Abdomen: No acute abnormality. Musculoskeletal: Acute displaced fractures of the right posterolateral eighth through tenth ribs. These ribs are also fractured posteriorly near the costovertebral junction. Acute nondisplaced fractures of the right lateral seventh and posterior eleventh ribs. Small amount of subcutaneous emphysema in the inferior right lateral chest wall. Severe bilateral glenohumeral osteoarthritis. IMPRESSION: 1. Large right pleural effusion. The majority of the effusion is simple fluid, however there is a small 1.9 x 2.2 cm area of hemorrhage within the effusion inferolaterally. No pneumothorax. 2. Acute displaced fractures of the right posterolateral eighth through tenth ribs. These ribs are also fractured posteriorly near the costovertebral junction. 3. Acute nondisplaced fractures of the right lateral seventh and posterior eleventh ribs. 4. Several tiny pulmonary nodules in both lungs measuring up to 3 mm. No follow-up needed if patient is low-risk (and has no known or suspected primary neoplasm). Non-contrast chest CT can be considered in 12 months if patient is high-risk. This recommendation follows the consensus statement: Guidelines for Management of Incidental Pulmonary Nodules Detected on CT Images: From the Fleischner Society 2017; Radiology 2017; 284:228-243. 5.  Emphysema (ICD10-J43.9). 6.  Aortic atherosclerosis (ICD10-I70.0). Electronically Signed  By: Titus Dubin M.D.   On: 03/04/2019 12:59    Scheduled Meds: . amLODipine  5 mg Oral Daily  . azithromycin  250 mg Oral Daily  . calcium-vitamin D  1 tablet Oral BID  . chlorhexidine  5 mL Mouth/Throat UD  . cholecalciferol  1,000 Units Oral Daily  . donepezil  10 mg Oral QHS  . lidocaine  1 patch Transdermal Q24H  . loratadine  10 mg Oral Daily  . multivitamin with minerals   Oral Daily  . sertraline  100 mg Oral QHS  . thyroid  105 mg Oral QAC breakfast   Continuous Infusions: . sodium chloride     . ferumoxytol       LOS: 3 days   Time spent: 45 minutes.  Sonya Nimrod, MD [facility] Pager [phone]  If 7PM-7AM, please contact night-coverage [email or web address] Password [facility] 03/04/2019, 1:49 PM   This record has been created using Systems analyst. Errors have been sought and corrected,but may not always be located. Such creation errors do not reflect on the standard of care.

## 2019-03-05 LAB — SARS CORONAVIRUS 2 (TAT 6-24 HRS): SARS Coronavirus 2: NEGATIVE

## 2019-03-05 LAB — BASIC METABOLIC PANEL
Anion gap: 9 (ref 5–15)
BUN: 17 mg/dL (ref 8–23)
CO2: 26 mmol/L (ref 22–32)
Calcium: 8.6 mg/dL — ABNORMAL LOW (ref 8.9–10.3)
Chloride: 98 mmol/L (ref 98–111)
Creatinine, Ser: 0.96 mg/dL (ref 0.44–1.00)
GFR calc Af Amer: 60 mL/min — ABNORMAL LOW (ref >60)
GFR calc non Af Amer: 51 mL/min — ABNORMAL LOW (ref >60)
Glucose, Bld: 106 mg/dL — ABNORMAL HIGH (ref 70–99)
Potassium: 4.6 mmol/L (ref 3.5–5.1)
Sodium: 133 mmol/L — ABNORMAL LOW (ref 135–145)

## 2019-03-05 LAB — CBC
HCT: 20.9 % — ABNORMAL LOW (ref 36.0–46.0)
Hemoglobin: 7.5 g/dL — ABNORMAL LOW (ref 12.0–15.0)
MCH: 30.9 pg (ref 26.0–34.0)
MCHC: 35.9 g/dL (ref 30.0–36.0)
MCV: 86 fL (ref 80.0–100.0)
Platelets: 189 10*3/uL (ref 150–400)
RBC: 2.43 MIL/uL — ABNORMAL LOW (ref 3.87–5.11)
RDW: 12.9 % (ref 11.5–15.5)
WBC: 7.5 10*3/uL (ref 4.0–10.5)
nRBC: 0 % (ref 0.0–0.2)

## 2019-03-05 MED ORDER — FERROUS SULFATE 325 (65 FE) MG PO TABS
325.0000 mg | ORAL_TABLET | Freq: Two times a day (BID) | ORAL | Status: DC
  Administered 2019-03-05 – 2019-03-06 (×3): 325 mg via ORAL
  Filled 2019-03-05 (×4): qty 1

## 2019-03-05 NOTE — Progress Notes (Signed)
PROGRESS NOTE    Sonya Carey  [id number] DOB: 1924-07-05 DOA: 03/01/2019 PCP: Tracie Harrier, MD   Brief Narrative:  Sonya Carey a54 y.o.pleasant Caucasian femalewith a known history of colon cancer, hypertension, dyslipidemia and hypothyroidism, who presented to the emergency room with onset of right-sided chest pain after having an accidental mechanical fall. Found to have right-sided multiple rib fractures.  Subjective: Patient was sitting comfortably in chair when seen this morning.  Her pain is improving.  Denies any shortness of breath.  Assessment & Plan:   Active Problems:   Multiple rib fractures   DNI (do not intubate)   Fall   Pneumothorax   Iron deficiency anemia  Multiple right-sided rib fractures with subsequent right-sided chest pain and traumatic small right apical pneumothorax secondary to mechanical fall. Surgery is following-conservative management at this time. Repeat chest x-ray without any pneumothorax. -PT/OT recommending SNF placement. -Continue pain management. -Covid testing ordered today for a possible discharge tomorrow.  Acute anemia.  Hemoglobin dropped to 7.4 from 11.6 on admission.  No obvious sign of bleeding.  Anemia panel with iron deficiency.  Did CT chest to rule out hemothorax which shows a large right-sided pleural effusion with a small loculated pneumothorax.  Consulted surgery, talked with Dr. Celine Ahr who does not think that much pneumothorax is responsible for that big of drop.  She advised to monitor her breathing and conservative management. FOBT negative. -CT abdomen and pelvis to rule out any occult bleeding was negative. -IV Feraheme. -I will keep holding aspirin and restart Lovenox for DVT prophylaxis. -SCDs for DVT prophylaxis. -Monitor CBC.  Right pleural effusion/infiltrate.  Remain afebrile. Blood culture remain negative -Continue azithromycin to complete course.  Essential hypertension.  Currently  normotensive. -Keep holding antihypertensives and monitor.  Hypothyroidism.  TSH elevated at 10.05, free T3 at lower normal and free T4 within normal limit. -Increase her thyroid supplement dose.  Depression. We will continue her Zoloft.  Objective: Vitals:   03/04/19 1530 03/05/19 0014 03/05/19 0022 03/05/19 0806  BP: (!) 109/44 137/60  (!) 131/58  Pulse: 81 92 87 82  Resp:  18  20  Temp: 98.4 F (36.9 C) 98.1 F (36.7 C)  98.5 F (36.9 C)  TempSrc: Oral Oral  Oral  SpO2: 97% (!) 88% 96% 98%  Weight:      Height:        Intake/Output Summary (Last 24 hours) at 03/05/2019 1527 Last data filed at 03/05/2019 1448 Gross per 24 hour  Intake 480 ml  Output 1150 ml  Net -670 ml   Filed Weights   03/01/19 0213  Weight: 67.1 kg    Examination:  General exam: Appears calm and comfortable  Respiratory system: Clear to auscultation. Respiratory effort normal.  Right-sided chest wall tenderness. Cardiovascular system: S1 & S2 heard, RRR. No JVD, murmurs, rubs, gallops or clicks. Gastrointestinal system: Soft, nontender, nondistended, bowel sounds positive. Central nervous system: Alert and oriented. No focal neurological deficits.Symmetric 5 x 5 power. Extremities: No edema, no cyanosis, pulses intact and symmetrical. Psychiatry: Judgement and insight appear normal. Mood & affect appropriate.    DVT prophylaxis: Lovenox Code Status: Partial Family Communication: I updated the daughter on phone. Disposition Plan: Pending improvement and SNF placement, most likely tomorrow.  Consultants:   Surgery  Procedures:  Antimicrobials:  Azithromycin  Data Reviewed: I have personally reviewed following labs and imaging studies  CBC: Recent Labs  Lab 03/01/19 0216 03/02/19 0442 03/04/19 0358 03/05/19 0435  WBC 16.6* 7.1 8.5 7.5  NEUTROABS 13.6*  --   --   --   HGB 11.6* 9.9* 7.4* 7.5*  HCT 35.7* 30.2* 21.1* 20.9*  MCV 93.9 91.5 86.1 86.0  PLT 247 180 157 [id number]    Basic Metabolic Panel: Recent Labs  Lab 03/01/19 0216 03/02/19 0442 03/05/19 0435  NA 138 138 133*  K 4.0 4.1 4.6  CL 108 103 98  CO2 19* 28 26  GLUCOSE 143* 97 106*  BUN 19 15 17   CREATININE 0.97 0.67 0.96  CALCIUM 8.6* 8.9 8.6*   GFR: Estimated Creatinine Clearance: 34.4 mL/min (by C-G formula based on SCr of 0.96 mg/dL). Liver Function Tests: No results for input(s): AST, ALT, ALKPHOS, BILITOT, PROT, ALBUMIN in the last 168 hours. No results for input(s): LIPASE, AMYLASE in the last 168 hours. No results for input(s): AMMONIA in the last 168 hours. Coagulation Profile: No results for input(s): INR, PROTIME in the last 168 hours. Cardiac Enzymes: No results for input(s): CKTOTAL, CKMB, CKMBINDEX, TROPONINI in the last 168 hours. BNP (last 3 results) No results for input(s): PROBNP in the last 8760 hours. HbA1C: No results for input(s): HGBA1C in the last 72 hours. CBG: No results for input(s): GLUCAP in the last 168 hours. Lipid Profile: No results for input(s): CHOL, HDL, LDLCALC, TRIG, CHOLHDL, LDLDIRECT in the last 72 hours. Thyroid Function Tests: No results for input(s): TSH, T4TOTAL, FREET4, T3FREE, THYROIDAB in the last 72 hours. Anemia Panel: Recent Labs    03/04/19 0851  VITAMINB12 397  FOLATE 25.0  FERRITIN 43  TIBC 301  IRON 28  RETICCTPCT 2.7   Sepsis Labs: No results for input(s): PROCALCITON, LATICACIDVEN in the last 168 hours.  Recent Results (from the past 240 hour(s))  SARS CORONAVIRUS 2 (TAT 6-24 HRS) Nasopharyngeal Nasopharyngeal Swab     Status: None   Collection Time: 03/01/19  4:01 AM   Specimen: Nasopharyngeal Swab  Result Value Ref Range Status   SARS Coronavirus 2 NEGATIVE NEGATIVE Final    Comment: (NOTE) SARS-CoV-2 target nucleic acids are NOT DETECTED. The SARS-CoV-2 RNA is generally detectable in upper and lower respiratory specimens during the acute phase of infection. Negative results do not preclude SARS-CoV-2 infection,  do not rule out co-infections with other pathogens, and should not be used as the sole basis for treatment or other patient management decisions. Negative results must be combined with clinical observations, patient history, and epidemiological information. The expected result is Negative. Fact Sheet for Patients: SugarRoll.be Fact Sheet for Healthcare Providers: https://www.woods-mathews.com/ This test is not yet approved or cleared by the [place] FDA and  has been authorized for detection and/or diagnosis of SARS-CoV-2 by FDA under an Emergency Use Authorization (EUA). This EUA will remain  in effect (meaning this test can be used) for the duration of the COVID-19 declaration under Section 56 4(b)(1) of the Act, 21 U.S.C. section 360bbb-3(b)(1), unless the authorization is terminated or revoked sooner. Performed at [facility], [place] [address] [place], [place] [address]   CULTURE, BLOOD (ROUTINE X 2) w Reflex to ID Panel     Status: None (Preliminary result)   Collection Time: 03/02/19  3:03 PM   Specimen: BLOOD  Result Value Ref Range Status   Specimen Description BLOOD RIGHT ASSIST CONTROL  Final   Special Requests   Final    BOTTLES DRAWN AEROBIC AND ANAEROBIC Blood Culture adequate volume   Culture   Final    NO GROWTH 3 DAYS Performed at [facility], [address] [place], [place] [address]    Report Status PENDING  Incomplete  CULTURE, BLOOD (ROUTINE X 2) w Reflex to ID Panel     Status: None (Preliminary result)   Collection Time: 03/02/19  3:07 PM   Specimen: BLOOD  Result Value Ref Range Status   Specimen Description BLOOD RIGHT HAND  Final   Special Requests   Final    BOTTLES DRAWN AEROBIC AND ANAEROBIC Blood Culture adequate volume   Culture   Final    NO GROWTH 3 DAYS Performed at [facility], [address], [place], [place] [address]    Report Status PENDING  Incomplete      Radiology Studies: CT CHEST WO CONTRAST  Result Date: 03/04/2019 CLINICAL DATA:  Fall with right-sided rib fractures. Dropping hemoglobin. Concern for hemothorax. EXAM: CT CHEST WITHOUT CONTRAST TECHNIQUE: Multidetector CT imaging of the chest was performed following the standard protocol without IV contrast. COMPARISON:  Chest x-ray from yesterday. FINDINGS: Cardiovascular: Normal heart size. No pericardial effusion. No thoracic aortic aneurysm. Coronary, aortic arch, and branch vessel atherosclerotic vascular disease. Dense atherosclerotic calcification of the mitral annulus. Normal caliber central pulmonary arteries. Mediastinum/Nodes: No enlarged mediastinal or axillary lymph nodes. 1.5 cm left and 1.0 cm right hypodense thyroid nodules. No follow-up recommended unless clinically warranted. Trachea and esophagus demonstrate no significant findings. Lungs/Pleura: Large right pleural effusion. The majority of the fluid is simple in attenuation, however there is a small 1.9 x 2.2 cm area of hyperdensity along the inferolateral margin of the effusion (series 2, image 114), consistent with hemorrhage. Adjacent partial collapse of the right lower lobe. No pneumothorax. Mild paraseptal emphysema. There are several tiny pulmonary nodules in both lungs measuring up to 3 mm. No consolidation. Upper Abdomen: No acute abnormality. Musculoskeletal: Acute displaced fractures of the right posterolateral eighth through tenth ribs. These ribs are also fractured posteriorly near the costovertebral junction. Acute nondisplaced fractures of the right lateral seventh and posterior eleventh ribs. Small amount of subcutaneous emphysema in the inferior right lateral chest wall. Severe bilateral glenohumeral osteoarthritis. IMPRESSION: 1. Large right pleural effusion. The majority of the effusion is simple fluid, however there is a small 1.9 x 2.2 cm area of hemorrhage within the effusion inferolaterally. No pneumothorax. 2.  Acute displaced fractures of the right posterolateral eighth through tenth ribs. These ribs are also fractured posteriorly near the costovertebral junction. 3. Acute nondisplaced fractures of the right lateral seventh and posterior eleventh ribs. 4. Several tiny pulmonary nodules in both lungs measuring up to 3 mm. No follow-up needed if patient is low-risk (and has no known or suspected primary neoplasm). Non-contrast chest CT can be considered in 12 months if patient is high-risk. This recommendation follows the consensus statement: Guidelines for Management of Incidental Pulmonary Nodules Detected on CT Images: From the Fleischner Society 2017; Radiology 2017; 284:228-243. 5.  Emphysema (ICD10-J43.9). 6.  Aortic atherosclerosis (ICD10-I70.0). Electronically Signed   By: Titus Dubin M.D.   On: 03/04/2019 12:59   CT ABDOMEN PELVIS W CONTRAST  Result Date: 03/04/2019 CLINICAL DATA:  Anemia with an acute drop in hemoglobin. The patient is status post fall 2 days ago that resulted in multiple right-sided rib fractures and a small hemothorax. EXAM: CT ABDOMEN AND PELVIS WITH CONTRAST TECHNIQUE: Multidetector CT imaging of the abdomen and pelvis was performed using the standard protocol following bolus administration of intravenous contrast. CONTRAST:  123mL OMNIPAQUE IOHEXOL 300 MG/ML  SOLN COMPARISON:  07/04/2018.  CT chest dated 03/04/2019 FINDINGS: Lower chest:  Again noted is a large right-sided pleural effusion with a hemorrhagic component in the right lung base similar to prior study. Atelectasis is noted.The heart size is normal. Hepatobiliary: The liver is normal. Status post cholecystectomy.There is no biliary ductal dilation. Pancreas: Normal contours without ductal dilatation. No peripancreatic fluid collection. Spleen: No splenic laceration or hematoma. Adrenals/Urinary Tract: --Adrenal glands: No adrenal hemorrhage. --Right kidney/ureter: No hydronephrosis or perinephric hematoma. --Left  kidney/ureter: No hydronephrosis or perinephric hematoma. --Urinary bladder: There is mild bladder wall thickening. Stomach/Bowel: --Stomach/Duodenum: No hiatal hernia or other gastric abnormality. Normal duodenal course and caliber. --Small bowel: No dilatation or inflammation. --Colon: The patient is status post left hemicolectomy. --Appendix: Normal. Vascular/Lymphatic: Atherosclerotic calcification is present within the non-aneurysmal abdominal aorta, without hemodynamically significant stenosis. --No retroperitoneal lymphadenopathy. --No mesenteric lymphadenopathy. --No pelvic or inguinal lymphadenopathy. Reproductive: Unremarkable Other: No ascites or free air. The abdominal wall is normal. Musculoskeletal. Again noted are acute rib fractures on the right side involving the ninth through eleventh ribs as well as the anterior anterior eighth and fifth ribs on the right. There are advanced degenerative changes of the visualized lumbar spine with significant levoscoliosis centered at the mid lumbar spine. Subcutaneous gas is noted along the right flank. IMPRESSION: 1. No specific abnormality within the abdomen or pelvis to explain the patient's acute anemia. 2. Persistent large right-sided pleural effusion with a small amount of hemorrhage at the lung base. 3. Subcutaneous gas is again noted along the patient's right flank. 4. Multiple chronic findings within the abdomen and pelvis as detailed above. Aortic Atherosclerosis (ICD10-I70.0). Electronically Signed   By: Constance Holster M.D.   On: 03/04/2019 19:07    Scheduled Meds: . amLODipine  5 mg Oral Daily  . azithromycin  250 mg Oral Daily  . calcium-vitamin D  1 tablet Oral BID  . chlorhexidine  5 mL Mouth/Throat UD  . cholecalciferol  1,000 Units Oral Daily  . donepezil  10 mg Oral QHS  . ferrous sulfate  325 mg Oral BID WC  . lidocaine  1 patch Transdermal Q24H  . loratadine  10 mg Oral Daily  . multivitamin with minerals   Oral Daily  .  sertraline  100 mg Oral QHS  . thyroid  105 mg Oral QAC breakfast   Continuous Infusions: . sodium chloride 250 mL (03/04/19 1353)     LOS: 4 days   Time spent: 35 minutes.  Lorella Nimrod, MD [facility] Pager [phone]  If 7PM-7AM, please contact night-coverage [email or web address] Password [facility] 03/05/2019, 3:27 PM   This record has been created using Dragon voice recognition software. Errors have been sought and corrected,but may not always be located. Such creation errors do not reflect on the standard of care.

## 2019-03-05 NOTE — Care Management Important Message (Signed)
Important Message  Patient Details  Name: APRYLE ROETHLER MRN: [id number] Date of Birth: 01-08-1925   Medicare Important Message Given:  Yes     Juliann Pulse A Ollie Delano 03/05/2019, 11:42 AM

## 2019-03-06 DIAGNOSIS — Z789 Other specified health status: Secondary | ICD-10-CM

## 2019-03-06 LAB — BASIC METABOLIC PANEL
Anion gap: 8 (ref 5–15)
BUN: 15 mg/dL (ref 8–23)
CO2: 26 mmol/L (ref 22–32)
Calcium: 8.6 mg/dL — ABNORMAL LOW (ref 8.9–10.3)
Chloride: 100 mmol/L (ref 98–111)
Creatinine, Ser: 0.83 mg/dL (ref 0.44–1.00)
GFR calc Af Amer: >60 mL/min (ref >60)
GFR calc non Af Amer: >60 mL/min (ref >60)
Glucose, Bld: 103 mg/dL — ABNORMAL HIGH (ref 70–99)
Potassium: 4.9 mmol/L (ref 3.5–5.1)
Sodium: 134 mmol/L — ABNORMAL LOW (ref 135–145)

## 2019-03-06 LAB — CBC
HCT: 22.7 % — ABNORMAL LOW (ref 36.0–46.0)
Hemoglobin: 7.4 g/dL — ABNORMAL LOW (ref 12.0–15.0)
MCH: 30 pg (ref 26.0–34.0)
MCHC: 32.6 g/dL (ref 30.0–36.0)
MCV: 91.9 fL (ref 80.0–100.0)
Platelets: 233 10*3/uL (ref 150–400)
RBC: 2.47 MIL/uL — ABNORMAL LOW (ref 3.87–5.11)
RDW: 13.2 % (ref 11.5–15.5)
WBC: 9.1 10*3/uL (ref 4.0–10.5)
nRBC: 0 % (ref 0.0–0.2)

## 2019-03-06 MED ORDER — LIDOCAINE 5 % EX PTCH: 1.0000 | MEDICATED_PATCH | CUTANEOUS | 0 refills | Status: DC

## 2019-03-06 MED ORDER — FERROUS SULFATE 325 (65 FE) MG PO TABS: 325.0000 mg | ORAL_TABLET | Freq: Two times a day (BID) | ORAL | 3 refills | Status: DC

## 2019-03-06 MED ORDER — MAGNESIUM HYDROXIDE 400 MG/5ML PO SUSP: 30.0000 mL | Freq: Every day | ORAL | 0 refills | Status: DC | PRN

## 2019-03-06 MED ORDER — DOCUSATE SODIUM 100 MG PO CAPS: 100.0000 mg | ORAL_CAPSULE | Freq: Two times a day (BID) | ORAL | 2 refills | Status: DC | PRN

## 2019-03-06 MED ORDER — OXYCODONE HCL 5 MG PO TABS: 5.0000 mg | ORAL_TABLET | ORAL | 0 refills | Status: DC | PRN

## 2019-03-06 MED ORDER — THYROID 97.5 MG PO TABS: 100.0000 mg | ORAL_TABLET | Freq: Every day | ORAL | Status: DC

## 2019-03-06 NOTE — Progress Notes (Signed)
OT Cancellation Note  Patient Details Name: [name] MRN: [id number] DOB: 12-16-1924   Cancelled Treatment:    Reason Eval/Treat Not Completed: Other (comment)(Nursing reports EMS has been called, and pt. is preparing for transport to SNF. All OT needs can be met at the next venue of care. Will sign off.)  Harrel Carina, MS, OTR/L 03/06/2019, 4:56 PM

## 2019-03-06 NOTE — TOC Progression Note (Signed)
Transition of Care [facility]) - Progression Note    Patient Details  Name: Sonya Carey MRN: [id number] Date of Birth: [birth year]  Transition of Care [facility]) CM/SW Contact  Su Hilt, RN Phone Number: 03/06/2019, 9:27 AM  Clinical Narrative:     Daughter [name] called back She verified the plan is for the patient to DC back to [facility] rehab. She was asking about her Hgb dropping to 7.4 down from 7.5. I explained that small drop is not significant however I encouraged her to speak with the doctor about her concerns.  She asked if the patient would be discharged on iron.  I explained that she would need to speak with the Doctor concerning the medications  She stated that the patient had been on iron previously and she wanted her to continue on it.  She also asked about the patient being discharged on Oxycontin.  I explained again that these are questions that would be better discussed with the physician.  She agreed to speak to the physician. I told [name] that I would give her a call when I get the DC order to let her know of the DC.  She agreed Expected Discharge Plan: [place] Barriers to Discharge: Continued Medical Work up  Expected Discharge Plan and Services Expected Discharge Plan: [place]   Discharge Planning Services: CM Consult Post Acute Care Choice: Durable Medical Equipment Living arrangements for the past 2 months: Assisted Living Facility                                       Social Determinants of Health (SDOH) Interventions    Readmission Risk Interventions No flowsheet data found.

## 2019-03-06 NOTE — TOC Transition Note (Signed)
Transition of Care [facility]) - CM/SW Discharge Note   Patient Details  Name: Sonya Carey MRN: [id number] Date of Birth: 08-21-1924  Transition of Care [facility]) CM/SW Contact:  Su Hilt, RN Phone Number: 03/06/2019, 12:21 PM   Clinical Narrative:     Patient to DC today via EMS to [facility]. The daughter [name] has been made aware Bedside nurse to call report to [facility] and call EMS when ready to transport  Final next level of care: Skilled Nursing Facility Barriers to Discharge: Barriers Resolved   Patient Goals and CMS Choice     Choice offered to / list presented to : Patient  Discharge Placement              Patient chooses bed at: [facility] Patient to be transferred to facility by: EMS Name of family member notified: Robin Patient and family notified of of transfer: 03/06/19  Discharge Plan and Services   Discharge Planning Services: CM Consult Post Acute Care Choice: Durable Medical Equipment                               Social Determinants of Health (SDOH) Interventions     Readmission Risk Interventions No flowsheet data found.

## 2019-03-06 NOTE — TOC Progression Note (Signed)
Transition of Care [facility]) - Progression Note    Patient Details  Name: Sonya Carey MRN: [id number] Date of Birth: Nov 29, 1924  Transition of Care [facility]) CM/SW [place], RN Phone Number: 03/06/2019, 9:17 AM  Clinical Narrative:    Called daughter [name], Unable to reach, left a General VM for a call back   Expected Discharge Plan: [place] Barriers to Discharge: Continued Medical Work up  Expected Discharge Plan and Services Expected Discharge Plan: [place]   Discharge Planning Services: CM Consult Post Acute Care Choice: Durable Medical Equipment Living arrangements for the past 2 months: Assisted Living Facility                                       Social Determinants of Health (SDOH) Interventions    Readmission Risk Interventions No flowsheet data found.

## 2019-03-06 NOTE — Discharge Summary (Signed)
Physician Discharge Summary  Sonya Carey [id number] DOB: 23-Dec-1924 DOA: 03/01/2019  PCP: Tracie Harrier, MD  Admit date: 03/01/2019 Discharge date: 03/06/2019  Admitted From: ALF Disposition:  SNF  Recommendations for Outpatient Follow-up:  1. Follow up with PCP in 1-2 weeks 2. Follow-up with  surgery. 3. Please obtain BMP/CBC in one week, will need weekly CBC till hemoglobin stabilizes. 4. Please repeat TSH in 4 weeks. 5. Please follow up on the following pending results:None  Home Health: No Equipment/Devices: Walker Discharge Condition: Stable CODE STATUS: Partial-DNI Diet recommendation: Heart Healthy   Brief/Interim Summary: Sonya Carey a67 y.o.pleasant Caucasian femalewith a known history of colon cancer, hypertension, dyslipidemia and hypothyroidism, who presented to the emergency room with onset of right-sided chest pain after having an accidental mechanical fall. Found to have right-sided multiple rib fractures.  Initially she did had a small pneumothorax which were resolved on subsequent imaging.  She also developed right-sided effusion with a small hemothorax.  Surgery saw her and they advised conservative management.  No need for chest tube. She will continue with pain management at her facility.  Patient did develop acute anemia with a drop of hemoglobin to 7.4 from 11.6 on admission.No obvious sign of bleeding.  Anemia panel with iron deficiency.  Did CT chest to rule out hemothorax which shows a large right-sided pleural effusion with a small loculated pneumothorax.  Consulted surgery, talked with Dr. Celine Ahr who does not think that much pneumothorax is responsible for that big of drop.  She advised to monitor her breathing and conservative management. FOBT negative. CT abdomen and pelvis to rule out any occult bleeding was negative. She was given 1 dose of Feraheme in the hospital and placed her on iron supplement for home/SNF. We did held aspirin  and Lovenox for a possible bleed. She can resume her aspirin, once hemoglobin improves.  It was stable at 7.5 on discharge. She needs monitoring of her CBC at facility.  She remained normotensive with [place], initially held her antihypertensives due to softer blood pressure which can be resumed.  She was also found to have elevated TSH at 10.05.  Her thyroid dose was increased to 100 from 75.  She will need a repeat TSH in 4 weeks.  Discharge Diagnoses:  Active Problems:   Multiple rib fractures   DNI (do not intubate)   Fall   Pneumothorax   Iron deficiency anemia  Discharge Instructions  Discharge Instructions    Diet - low sodium heart healthy   Complete by: As directed    Increase activity slowly   Complete by: As directed      Allergies as of 03/06/2019      Reactions   Celebrex [celecoxib] Other (See Comments)   GI upset   Latex Itching   NEGATIVE LATEX IgE (< 0.10) 11/04/2016   Statins Other (See Comments)   Muscle pain   Adhesive [tape] Rash      Medication List    STOP taking these medications   aspirin 325 MG tablet     TAKE these medications   amLODipine 5 MG tablet Commonly known as: NORVASC Take 5 mg by mouth daily.   Calcium-Magnesium 500-250 MG Tabs Take 2 tablets by mouth 2 (two) times daily.   cetirizine 10 MG tablet Commonly known as: ZYRTEC Take 10 mg by mouth daily.   chlorhexidine 0.12 % solution Commonly known as: PERIDEX as directed.   cholecalciferol 25 MCG (1000 UT) tablet Commonly known as: VITAMIN D Take 1,000 Units by  mouth daily.   docusate sodium 100 MG capsule Commonly known as: COLACE Take 1 capsule (100 mg total) by mouth 2 (two) times daily as needed for mild constipation.   donepezil 10 MG tablet Commonly known as: ARICEPT Take 10 mg by mouth at bedtime.   ferrous sulfate 325 (65 FE) MG tablet Take 1 tablet (325 mg total) by mouth 2 (two) times daily with a meal.   hydroxypropyl methylcellulose / hypromellose 2.5 %  ophthalmic solution Commonly known as: ISOPTO TEARS / GONIOVISC Place 1 drop into both eyes 2 (two) times daily as needed for dry eyes.   lidocaine 5 % Commonly known as: LIDODERM Place 1 patch onto the skin daily. Remove & Discard patch within 12 hours or as directed by MD Start taking on: March 07, 2019   magnesium hydroxide 400 MG/5ML suspension Commonly known as: MILK OF MAGNESIA Take 30 mLs by mouth daily as needed for mild constipation.   MULTIVITAMIN ADULT PO Take 7 tablets by mouth daily. SHAKLEE "LIFE STRIP" MULTIVITAMIN PACK   OVER THE COUNTER MEDICATION Take 1 tablet by mouth daily. SHAKLEE HERBLAX   OVER THE COUNTER MEDICATION Take 2 tablets by mouth daily. SHAKLEE OSTEO MATRIX   oxyCODONE 5 MG immediate release tablet Commonly known as: Oxy IR/ROXICODONE Take 1 tablet (5 mg total) by mouth every 4 (four) hours as needed for moderate pain.   sertraline 100 MG tablet Commonly known as: ZOLOFT Take 100 mg by mouth at bedtime.   Thyroid 97.5 MG Tabs Take 1.0256 tablets (100 mg total) by mouth daily before breakfast. Start taking on: March 07, 2019 What changed:   medication strength  how much to take   triamcinolone cream 0.1 % Commonly known as: KENALOG Apply 1 application topically daily as needed (skin breakdown).       Allergies  Allergen Reactions  . Celebrex [Celecoxib] Other (See Comments)    GI upset  . Latex Itching    NEGATIVE LATEX IgE (< 0.10) 11/04/2016  . Statins Other (See Comments)    Muscle pain  . Adhesive [Tape] Rash    Consultations:  Surgery  Procedures/Studies: DG Chest 2 View  Result Date: 03/03/2019 CLINICAL DATA:  Right-sided chest, known right apical pneumothorax from fall. EXAM: CHEST - 2 VIEW COMPARISON:  Twelve 17 in 03/02/2019 FINDINGS: Posteriorly layering right-sided pleural effusion. Small amounts of subcutaneous emphysema along the right chest are diminished compared to the prior study. Cardiomediastinal  contours are stable. Right sided pneumothorax not seen on today's study. Signs of basilar airspace disease as on the recent comparison. Glenohumeral degenerative changes are severe similar to prior exam, right-sided rib fractures not as well seen as on previous exams. IMPRESSION: 1. Moderate right effusion and basilar airspace disease similar to prior study. 2. No signs of pneumothorax on today's exam Electronically Signed   By: Zetta Bills M.D.   On: 03/03/2019 10:15   DG Chest 2 View  Result Date: 03/01/2019 CLINICAL DATA:  Acute onset right chest pain after a fall 03/01/2019. Right pneumothorax. EXAM: CHEST - 2 VIEW COMPARISON:  Plain film of the chest 03/01/2019 at 2:46 a.m. FINDINGS: Small right apical pneumothorax is again seen with some gas tracking into the soft tissues of the neck. Right pleural effusion has increased since the prior examination. Hazy interstitial opacities bilaterally are most suggestive of pulmonary edema. Heart size is upper normal. Atherosclerosis is noted. Right rib fractures are again seen. Marked scoliosis noted. IMPRESSION: No change in a small right pneumothorax. Worsened  aeration diffusely has an appearance most suggestive of pulmonary edema. Increased small right pleural effusion. Right rib fractures. Atherosclerosis. Electronically Signed   By: Inge Rise M.D.   On: 03/01/2019 09:29   DG Ribs Unilateral W/Chest Right  Result Date: 03/01/2019 CLINICAL DATA:  Fall with right rib pain. Trip 20 ambulating to the bathroom. EXAM: RIGHT RIBS AND CHEST - 3+ VIEW COMPARISON:  None. FINDINGS: Small right pneumothorax most prominent at the apices. Subcutaneous emphysema about the right chest wall extends into the right supraclavicular soft tissues minimally displaced fractures of right anterior 5, 6, and seventh ribs. Suspect additional lower anterior rib fracture. Hazy opacity at the right lung base may represent pleural fluid and atelectasis. Heart is normal in size.  Aortic atherosclerosis. Advanced degenerative change of both shoulders. IMPRESSION: 1. Small right pneumothorax. Subcutaneous emphysema about the right chest wall extending into the right supraclavicular soft tissues. 2. Minimally displaced right anterior 5, 6, and seventh rib fractures. Suspect additional lower anterior rib fracture that is displaced. 3. Hazy opacity at the right lung base may represent pleural fluid and atelectasis. Critical Value/emergent results were called by telephone at the time of interpretation on 03/01/2019 at 3:16 am to Dr Luvenia Starch SUNG , who verbally acknowledged these results. Electronically Signed   By: Keith Rake M.D.   On: 03/01/2019 03:16   CT Head Wo Contrast  Result Date: 03/01/2019 CLINICAL DATA:  Mechanical fall. Trip 20 ambulating to the bathroom. Question loss of consciousness. EXAM: CT HEAD WITHOUT CONTRAST TECHNIQUE: Contiguous axial images were obtained from the base of the skull through the vertex without intravenous contrast. COMPARISON:  Head CT 11/11/2017 FINDINGS: Brain: No intracranial hemorrhage, mass effect, or midline shift. Stable degree of atrophy. No hydrocephalus. The basilar cisterns are patent. Moderate chronic small vessel ischemia, unchanged. No evidence of territorial infarct or acute ischemia. No extra-axial or intracranial fluid collection. Vascular: Atherosclerosis of skullbase vasculature without hyperdense vessel or abnormal calcification. Skull: No fracture or focal lesion. Sinuses/Orbits: Trace fluid in right side of sphenoid sinus. Paranasal sinuses otherwise clear. Mastoid air cells well aerated. Bilateral ocular surgery. Other: None. IMPRESSION: 1. No acute intracranial abnormality. No skull fracture. 2. Stable atrophy and chronic small vessel ischemia. Electronically Signed   By: Keith Rake M.D.   On: 03/01/2019 03:24   CT CHEST WO CONTRAST  Result Date: 03/04/2019 CLINICAL DATA:  Fall with right-sided rib fractures. Dropping  hemoglobin. Concern for hemothorax. EXAM: CT CHEST WITHOUT CONTRAST TECHNIQUE: Multidetector CT imaging of the chest was performed following the standard protocol without IV contrast. COMPARISON:  Chest x-ray from yesterday. FINDINGS: Cardiovascular: Normal heart size. No pericardial effusion. No thoracic aortic aneurysm. Coronary, aortic arch, and branch vessel atherosclerotic vascular disease. Dense atherosclerotic calcification of the mitral annulus. Normal caliber central pulmonary arteries. Mediastinum/Nodes: No enlarged mediastinal or axillary lymph nodes. 1.5 cm left and 1.0 cm right hypodense thyroid nodules. No follow-up recommended unless clinically warranted. Trachea and esophagus demonstrate no significant findings. Lungs/Pleura: Large right pleural effusion. The majority of the fluid is simple in attenuation, however there is a small 1.9 x 2.2 cm area of hyperdensity along the inferolateral margin of the effusion (series 2, image 114), consistent with hemorrhage. Adjacent partial collapse of the right lower lobe. No pneumothorax. Mild paraseptal emphysema. There are several tiny pulmonary nodules in both lungs measuring up to 3 mm. No consolidation. Upper Abdomen: No acute abnormality. Musculoskeletal: Acute displaced fractures of the right posterolateral eighth through tenth ribs. These ribs are also  fractured posteriorly near the costovertebral junction. Acute nondisplaced fractures of the right lateral seventh and posterior eleventh ribs. Small amount of subcutaneous emphysema in the inferior right lateral chest wall. Severe bilateral glenohumeral osteoarthritis. IMPRESSION: 1. Large right pleural effusion. The majority of the effusion is simple fluid, however there is a small 1.9 x 2.2 cm area of hemorrhage within the effusion inferolaterally. No pneumothorax. 2. Acute displaced fractures of the right posterolateral eighth through tenth ribs. These ribs are also fractured posteriorly near the  costovertebral junction. 3. Acute nondisplaced fractures of the right lateral seventh and posterior eleventh ribs. 4. Several tiny pulmonary nodules in both lungs measuring up to 3 mm. No follow-up needed if patient is low-risk (and has no known or suspected primary neoplasm). Non-contrast chest CT can be considered in 12 months if patient is high-risk. This recommendation follows the consensus statement: Guidelines for Management of Incidental Pulmonary Nodules Detected on CT Images: From the Fleischner Society 2017; Radiology 2017; 284:228-243. 5.  Emphysema (ICD10-J43.9). 6.  Aortic atherosclerosis (ICD10-I70.0). Electronically Signed   By: Titus Dubin M.D.   On: 03/04/2019 12:59   CT ABDOMEN PELVIS W CONTRAST  Result Date: 03/04/2019 CLINICAL DATA:  Anemia with an acute drop in hemoglobin. The patient is status post fall 2 days ago that resulted in multiple right-sided rib fractures and a small hemothorax. EXAM: CT ABDOMEN AND PELVIS WITH CONTRAST TECHNIQUE: Multidetector CT imaging of the abdomen and pelvis was performed using the standard protocol following bolus administration of intravenous contrast. CONTRAST:  147mL OMNIPAQUE IOHEXOL 300 MG/ML  SOLN COMPARISON:  07/04/2018.  CT chest dated 03/04/2019 FINDINGS: Lower chest: Again noted is a large right-sided pleural effusion with a hemorrhagic component in the right lung base similar to prior study. Atelectasis is noted.The heart size is normal. Hepatobiliary: The liver is normal. Status post cholecystectomy.There is no biliary ductal dilation. Pancreas: Normal contours without ductal dilatation. No peripancreatic fluid collection. Spleen: No splenic laceration or hematoma. Adrenals/Urinary Tract: --Adrenal glands: No adrenal hemorrhage. --Right kidney/ureter: No hydronephrosis or perinephric hematoma. --Left kidney/ureter: No hydronephrosis or perinephric hematoma. --Urinary bladder: There is mild bladder wall thickening. Stomach/Bowel:  --Stomach/Duodenum: No hiatal hernia or other gastric abnormality. Normal duodenal course and caliber. --Small bowel: No dilatation or inflammation. --Colon: The patient is status post left hemicolectomy. --Appendix: Normal. Vascular/Lymphatic: Atherosclerotic calcification is present within the non-aneurysmal abdominal aorta, without hemodynamically significant stenosis. --No retroperitoneal lymphadenopathy. --No mesenteric lymphadenopathy. --No pelvic or inguinal lymphadenopathy. Reproductive: Unremarkable Other: No ascites or free air. The abdominal wall is normal. Musculoskeletal. Again noted are acute rib fractures on the right side involving the ninth through eleventh ribs as well as the anterior anterior eighth and fifth ribs on the right. There are advanced degenerative changes of the visualized lumbar spine with significant levoscoliosis centered at the mid lumbar spine. Subcutaneous gas is noted along the right flank. IMPRESSION: 1. No specific abnormality within the abdomen or pelvis to explain the patient's acute anemia. 2. Persistent large right-sided pleural effusion with a small amount of hemorrhage at the lung base. 3. Subcutaneous gas is again noted along the patient's right flank. 4. Multiple chronic findings within the abdomen and pelvis as detailed above. Aortic Atherosclerosis (ICD10-I70.0). Electronically Signed   By: Constance Holster M.D.   On: 03/04/2019 19:07   DG Chest Port 1 View  Result Date: 03/02/2019 CLINICAL DATA:  Follow-up pneumothorax. EXAM: PORTABLE CHEST 1 VIEW COMPARISON:  03/01/2019. FINDINGS: Heart size normal. Diffuse right lung infiltrate with small right pleural  effusion. Prominent thoracic spine scoliosis and degenerative change. Prominent degenerative changes both shoulders again noted. Surgical clips right upper quadrant. IMPRESSION: Diffuse right lung infiltrate with small right pleural effusion. No pneumothorax. Electronically Signed   By: Marcello Moores  Register   On:  03/02/2019 09:07    Subjective: Patient was complaining of right-sided chest wall pain.  She was due for her pain meds.  Denies any shortness of breath.  Denies any nausea or vomiting.  Discharge Exam: Vitals:   03/06/19 0001 03/06/19 0735  BP: (!) 123/51 (!) 129/54  Pulse: 84 78  Resp: 16 17  Temp: 98.9 F (37.2 C) 97.8 F (36.6 C)  SpO2: 97% 96%   Vitals:   03/05/19 0806 03/05/19 1545 03/06/19 0001 03/06/19 0735  BP: (!) 131/58 133/60 (!) 123/51 (!) 129/54  Pulse: 82 86 84 78  Resp: 20 20 16 17   Temp: 98.5 F (36.9 C) 98 F (36.7 C) 98.9 F (37.2 C) 97.8 F (36.6 C)  TempSrc: Oral Oral Oral Oral  SpO2: 98% 96% 97% 96%  Weight:      Height:        General: Pt is alert, awake, not in acute distress Cardiovascular: RRR, S1/S2 +, no rubs, no gallops Respiratory: CTA bilaterally, decreased air entry at right base, no wheezing, no rhonchi Abdominal: Soft, NT, ND, bowel sounds + Extremities: no edema, no cyanosis, multiple bilateral ecchymosis.   The results of significant diagnostics from this hospitalization (including imaging, microbiology, ancillary and laboratory) are listed below for reference.    Microbiology: Recent Results (from the past 240 hour(s))  SARS CORONAVIRUS 2 (TAT 6-24 HRS) Nasopharyngeal Nasopharyngeal Swab     Status: None   Collection Time: 03/01/19  4:01 AM   Specimen: Nasopharyngeal Swab  Result Value Ref Range Status   SARS Coronavirus 2 NEGATIVE NEGATIVE Final    Comment: (NOTE) SARS-CoV-2 target nucleic acids are NOT DETECTED. The SARS-CoV-2 RNA is generally detectable in upper and lower respiratory specimens during the acute phase of infection. Negative results do not preclude SARS-CoV-2 infection, do not rule out co-infections with other pathogens, and should not be used as the sole basis for treatment or other patient management decisions. Negative results must be combined with clinical observations, patient history, and  epidemiological information. The expected result is Negative. Fact Sheet for Patients: SugarRoll.be Fact Sheet for Healthcare Providers: https://www.woods-mathews.com/ This test is not yet approved or cleared by the [place] FDA and  has been authorized for detection and/or diagnosis of SARS-CoV-2 by FDA under an Emergency Use Authorization (EUA). This EUA will remain  in effect (meaning this test can be used) for the duration of the COVID-19 declaration under Section 56 4(b)(1) of the Act, 21 U.S.C. section 360bbb-3(b)(1), unless the authorization is terminated or revoked sooner. Performed at [facility], [place] [address] [place], [place] [address]   CULTURE, BLOOD (ROUTINE X 2) w Reflex to ID Panel     Status: None (Preliminary result)   Collection Time: 03/02/19  3:03 PM   Specimen: BLOOD  Result Value Ref Range Status   Specimen Description BLOOD RIGHT ASSIST CONTROL  Final   Special Requests   Final    BOTTLES DRAWN AEROBIC AND ANAEROBIC Blood Culture adequate volume   Culture   Final    NO GROWTH 4 DAYS Performed at [facility], [address] [place], [place] [address]    Report Status PENDING  Incomplete  CULTURE, BLOOD (ROUTINE X 2) w Reflex to ID  Panel     Status: None (Preliminary result)   Collection Time: 03/02/19  3:07 PM   Specimen: BLOOD  Result Value Ref Range Status   Specimen Description BLOOD RIGHT HAND  Final   Special Requests   Final    BOTTLES DRAWN AEROBIC AND ANAEROBIC Blood Culture adequate volume   Culture   Final    NO GROWTH 4 DAYS Performed at [facility], [address] [place], [place] [address]    Report Status PENDING  Incomplete  SARS CORONAVIRUS 2 (TAT 6-24 HRS) Nasopharyngeal Nasopharyngeal Swab     Status: None   Collection Time: 03/05/19  5:36 PM   Specimen: Nasopharyngeal Swab  Result Value Ref Range Status   SARS Coronavirus 2 NEGATIVE NEGATIVE Final     Comment: (NOTE) SARS-CoV-2 target nucleic acids are NOT DETECTED. The SARS-CoV-2 RNA is generally detectable in upper and lower respiratory specimens during the acute phase of infection. Negative results do not preclude SARS-CoV-2 infection, do not rule out co-infections with other pathogens, and should not be used as the sole basis for treatment or other patient management decisions. Negative results must be combined with clinical observations, patient history, and epidemiological information. The expected result is Negative. Fact Sheet for Patients: SugarRoll.be Fact Sheet for Healthcare Providers: https://www.woods-mathews.com/ This test is not yet approved or cleared by the [place] FDA and  has been authorized for detection and/or diagnosis of SARS-CoV-2 by FDA under an Emergency Use Authorization (EUA). This EUA will remain  in effect (meaning this test can be used) for the duration of the COVID-19 declaration under Section 56 4(b)(1) of the Act, 21 U.S.C. section 360bbb-3(b)(1), unless the authorization is terminated or revoked sooner. Performed at [facility], [place] [address] [place], [place] [address]      Labs: BNP (last 3 results) No results for input(s): BNP in the last 8760 hours. Basic Metabolic Panel: Recent Labs  Lab 03/01/19 0216 03/02/19 0442 03/05/19 0435 03/06/19 0457  NA 138 138 133* 134*  K 4.0 4.1 4.6 4.9  CL 108 103 98 100  CO2 19* 28 26 26   GLUCOSE 143* 97 106* 103*  BUN 19 15 17 15   CREATININE 0.97 0.67 0.96 0.83  CALCIUM 8.6* 8.9 8.6* 8.6*   Liver Function Tests: No results for input(s): AST, ALT, ALKPHOS, BILITOT, PROT, ALBUMIN in the last 168 hours. No results for input(s): LIPASE, AMYLASE in the last 168 hours. No results for input(s): AMMONIA in the last 168 hours. CBC: Recent Labs  Lab 03/01/19 0216 03/02/19 0442 03/04/19 0358 03/05/19 0435 03/06/19 0457  WBC 16.6* 7.1 8.5 7.5  9.1  NEUTROABS 13.6*  --   --   --   --   HGB 11.6* 9.9* 7.4* 7.5* 7.4*  HCT 35.7* 30.2* 21.1* 20.9* 22.7*  MCV 93.9 91.5 86.1 86.0 91.9  PLT 247 180 157 189 233   Cardiac Enzymes: No results for input(s): CKTOTAL, CKMB, CKMBINDEX, TROPONINI in the last 168 hours. BNP: Invalid input(s): POCBNP CBG: No results for input(s): GLUCAP in the last 168 hours. D-Dimer No results for input(s): DDIMER in the last 72 hours. Hgb A1c No results for input(s): HGBA1C in the last 72 hours. Lipid Profile No results for input(s): CHOL, HDL, LDLCALC, TRIG, CHOLHDL, LDLDIRECT in the last 72 hours. Thyroid function studies No results for input(s): TSH, T4TOTAL, T3FREE, THYROIDAB in the last 72 hours.  Invalid input(s): FREET3 Anemia work up [employer]    03/04/19 [place]  397  FOLATE 25.0  FERRITIN 43  TIBC 301  IRON 28  RETICCTPCT 2.7   Urinalysis No results found for: COLORURINE, APPEARANCEUR, LABSPEC, PHURINE, GLUCOSEU, HGBUR, BILIRUBINUR, KETONESUR, PROTEINUR, UROBILINOGEN, NITRITE, LEUKOCYTESUR Sepsis Labs Invalid input(s): PROCALCITONIN,  WBC,  LACTICIDVEN Microbiology Recent Results (from the past 240 hour(s))  SARS CORONAVIRUS 2 (TAT 6-24 HRS) Nasopharyngeal Nasopharyngeal Swab     Status: None   Collection Time: 03/01/19  4:01 AM   Specimen: Nasopharyngeal Swab  Result Value Ref Range Status   SARS Coronavirus 2 NEGATIVE NEGATIVE Final    Comment: (NOTE) SARS-CoV-2 target nucleic acids are NOT DETECTED. The SARS-CoV-2 RNA is generally detectable in upper and lower respiratory specimens during the acute phase of infection. Negative results do not preclude SARS-CoV-2 infection, do not rule out co-infections with other pathogens, and should not be used as the sole basis for treatment or other patient management decisions. Negative results must be combined with clinical observations, patient history, and epidemiological information. The expected result is  Negative. Fact Sheet for Patients: SugarRoll.be Fact Sheet for Healthcare Providers: https://www.woods-mathews.com/ This test is not yet approved or cleared by the [place] FDA and  has been authorized for detection and/or diagnosis of SARS-CoV-2 by FDA under an Emergency Use Authorization (EUA). This EUA will remain  in effect (meaning this test can be used) for the duration of the COVID-19 declaration under Section 56 4(b)(1) of the Act, 21 U.S.C. section 360bbb-3(b)(1), unless the authorization is terminated or revoked sooner. Performed at [facility], [place] [address], [place], [place] [address]   CULTURE, BLOOD (ROUTINE X 2) w Reflex to ID Panel     Status: None (Preliminary result)   Collection Time: 03/02/19  3:03 PM   Specimen: BLOOD  Result Value Ref Range Status   Specimen Description BLOOD RIGHT ASSIST CONTROL  Final   Special Requests   Final    BOTTLES DRAWN AEROBIC AND ANAEROBIC Blood Culture adequate volume   Culture   Final    NO GROWTH 4 DAYS Performed at [facility], [address], [place], [place] [address]    Report Status PENDING  Incomplete  CULTURE, BLOOD (ROUTINE X 2) w Reflex to ID Panel     Status: None (Preliminary result)   Collection Time: 03/02/19  3:07 PM   Specimen: BLOOD  Result Value Ref Range Status   Specimen Description BLOOD RIGHT HAND  Final   Special Requests   Final    BOTTLES DRAWN AEROBIC AND ANAEROBIC Blood Culture adequate volume   Culture   Final    NO GROWTH 4 DAYS Performed at [facility], [address], [place], [place] [address]    Report Status PENDING  Incomplete  SARS CORONAVIRUS 2 (TAT 6-24 HRS) Nasopharyngeal Nasopharyngeal Swab     Status: None   Collection Time: 03/05/19  5:36 PM   Specimen: Nasopharyngeal Swab  Result Value Ref Range Status   SARS Coronavirus 2 NEGATIVE NEGATIVE Final    Comment: (NOTE) SARS-CoV-2 target nucleic acids are NOT  DETECTED. The SARS-CoV-2 RNA is generally detectable in upper and lower respiratory specimens during the acute phase of infection. Negative results do not preclude SARS-CoV-2 infection, do not rule out co-infections with other pathogens, and should not be used as the sole basis for treatment or other patient management decisions. Negative results must be combined with clinical observations, patient history, and epidemiological information. The expected result is Negative. Fact Sheet for Patients: SugarRoll.be Fact Sheet for Healthcare  Providers: https://www.woods-mathews.com/ This test is not yet approved or cleared by the [place] and  has been authorized for detection and/or diagnosis of SARS-CoV-2 by FDA under an Emergency Use Authorization (EUA). This EUA will remain  in effect (meaning this test can be used) for the duration of the COVID-19 declaration under Section 56 4(b)(1) of the Act, 21 U.S.C. section 360bbb-3(b)(1), unless the authorization is terminated or revoked sooner. Performed at [facility], [place] [address] [place], [place] [address]     Time coordinating discharge: Over 30 minutes  SIGNED:  Lorella Nimrod, MD  [facility] 03/06/2019, 11:25 AM Pager [phone]  If 7PM-7AM, please contact night-coverage [email or web address] Password [email or web address]  This record has been created using Systems analyst. Errors have been sought and corrected,but may not always be located. Such creation errors do not reflect on the standard of care.

## 2019-03-06 NOTE — Progress Notes (Signed)
Called EMS for transport. 

## 2019-03-07 LAB — CULTURE, BLOOD (ROUTINE X 2)
Culture: NO GROWTH
Culture: NO GROWTH
Special Requests: ADEQUATE
Special Requests: ADEQUATE

## 2019-03-08 DIAGNOSIS — D649 Anemia, unspecified: Secondary | ICD-10-CM

## 2019-03-08 DIAGNOSIS — E039 Hypothyroidism, unspecified: Secondary | ICD-10-CM

## 2019-03-08 DIAGNOSIS — S2231XA Fracture of one rib, right side, initial encounter for closed fracture: Secondary | ICD-10-CM

## 2019-03-08 DIAGNOSIS — F0390 Unspecified dementia without behavioral disturbance: Secondary | ICD-10-CM

## 2019-03-08 DIAGNOSIS — I10 Essential (primary) hypertension: Secondary | ICD-10-CM

## 2019-03-08 DIAGNOSIS — F39 Unspecified mood [affective] disorder: Secondary | ICD-10-CM

## 2019-03-20 DIAGNOSIS — J90 Pleural effusion, not elsewhere classified: Secondary | ICD-10-CM | POA: Diagnosis not present

## 2019-03-20 DIAGNOSIS — F39 Unspecified mood [affective] disorder: Secondary | ICD-10-CM | POA: Diagnosis not present

## 2019-03-20 DIAGNOSIS — F0390 Unspecified dementia without behavioral disturbance: Secondary | ICD-10-CM | POA: Diagnosis not present

## 2019-03-20 DIAGNOSIS — I10 Essential (primary) hypertension: Secondary | ICD-10-CM | POA: Diagnosis not present

## 2019-03-20 DIAGNOSIS — S2231XA Fracture of one rib, right side, initial encounter for closed fracture: Secondary | ICD-10-CM

## 2019-03-27 DIAGNOSIS — R079 Chest pain, unspecified: Secondary | ICD-10-CM | POA: Diagnosis not present

## 2019-04-10 DIAGNOSIS — F39 Unspecified mood [affective] disorder: Secondary | ICD-10-CM | POA: Diagnosis not present

## 2019-04-13 DIAGNOSIS — B351 Tinea unguium: Secondary | ICD-10-CM

## 2019-04-24 DIAGNOSIS — F39 Unspecified mood [affective] disorder: Secondary | ICD-10-CM

## 2019-04-24 DIAGNOSIS — E441 Mild protein-calorie malnutrition: Secondary | ICD-10-CM

## 2019-04-24 DIAGNOSIS — I10 Essential (primary) hypertension: Secondary | ICD-10-CM | POA: Diagnosis not present

## 2019-04-24 DIAGNOSIS — F0390 Unspecified dementia without behavioral disturbance: Secondary | ICD-10-CM

## 2019-05-16 DIAGNOSIS — M1990 Unspecified osteoarthritis, unspecified site: Secondary | ICD-10-CM | POA: Diagnosis not present

## 2019-05-16 DIAGNOSIS — E039 Hypothyroidism, unspecified: Secondary | ICD-10-CM | POA: Diagnosis not present

## 2019-05-25 DIAGNOSIS — E43 Unspecified severe protein-calorie malnutrition: Secondary | ICD-10-CM

## 2019-05-25 DIAGNOSIS — I10 Essential (primary) hypertension: Secondary | ICD-10-CM

## 2019-05-25 DIAGNOSIS — G309 Alzheimer's disease, unspecified: Secondary | ICD-10-CM

## 2019-05-25 DIAGNOSIS — F39 Unspecified mood [affective] disorder: Secondary | ICD-10-CM

## 2019-05-25 DIAGNOSIS — D649 Anemia, unspecified: Secondary | ICD-10-CM

## 2019-05-25 DIAGNOSIS — E039 Hypothyroidism, unspecified: Secondary | ICD-10-CM

## 2019-06-21 DIAGNOSIS — E441 Mild protein-calorie malnutrition: Secondary | ICD-10-CM | POA: Diagnosis not present

## 2019-07-18 DIAGNOSIS — L03032 Cellulitis of left toe: Secondary | ICD-10-CM

## 2019-07-20 DIAGNOSIS — B351 Tinea unguium: Secondary | ICD-10-CM

## 2019-08-10 DIAGNOSIS — H3530 Unspecified macular degeneration: Secondary | ICD-10-CM

## 2019-08-10 DIAGNOSIS — I10 Essential (primary) hypertension: Secondary | ICD-10-CM

## 2019-08-10 DIAGNOSIS — E441 Mild protein-calorie malnutrition: Secondary | ICD-10-CM | POA: Diagnosis not present

## 2019-08-10 DIAGNOSIS — G301 Alzheimer's disease with late onset: Secondary | ICD-10-CM | POA: Diagnosis not present

## 2019-09-26 DIAGNOSIS — F39 Unspecified mood [affective] disorder: Secondary | ICD-10-CM | POA: Diagnosis not present

## 2019-09-26 DIAGNOSIS — H353290 Exudative age-related macular degeneration, unspecified eye, stage unspecified: Secondary | ICD-10-CM

## 2019-09-26 DIAGNOSIS — I10 Essential (primary) hypertension: Secondary | ICD-10-CM | POA: Diagnosis not present

## 2019-09-26 DIAGNOSIS — E43 Unspecified severe protein-calorie malnutrition: Secondary | ICD-10-CM | POA: Diagnosis not present

## 2019-09-26 DIAGNOSIS — G309 Alzheimer's disease, unspecified: Secondary | ICD-10-CM | POA: Diagnosis not present

## 2019-09-26 DIAGNOSIS — D649 Anemia, unspecified: Secondary | ICD-10-CM

## 2019-11-07 DIAGNOSIS — B351 Tinea unguium: Secondary | ICD-10-CM | POA: Diagnosis not present

## 2019-12-13 DIAGNOSIS — F39 Unspecified mood [affective] disorder: Secondary | ICD-10-CM

## 2019-12-13 DIAGNOSIS — G301 Alzheimer's disease with late onset: Secondary | ICD-10-CM

## 2019-12-13 DIAGNOSIS — E441 Mild protein-calorie malnutrition: Secondary | ICD-10-CM | POA: Diagnosis not present

## 2019-12-13 DIAGNOSIS — I10 Essential (primary) hypertension: Secondary | ICD-10-CM

## 2020-01-18 DIAGNOSIS — G309 Alzheimer's disease, unspecified: Secondary | ICD-10-CM | POA: Diagnosis not present

## 2020-01-18 DIAGNOSIS — B351 Tinea unguium: Secondary | ICD-10-CM

## 2020-01-18 DIAGNOSIS — E43 Unspecified severe protein-calorie malnutrition: Secondary | ICD-10-CM | POA: Diagnosis not present

## 2020-01-18 DIAGNOSIS — E039 Hypothyroidism, unspecified: Secondary | ICD-10-CM

## 2020-01-18 DIAGNOSIS — I10 Essential (primary) hypertension: Secondary | ICD-10-CM | POA: Diagnosis not present

## 2020-01-18 DIAGNOSIS — F39 Unspecified mood [affective] disorder: Secondary | ICD-10-CM | POA: Diagnosis not present

## 2020-01-18 DIAGNOSIS — H353290 Exudative age-related macular degeneration, unspecified eye, stage unspecified: Secondary | ICD-10-CM

## 2020-03-31 DIAGNOSIS — I10 Essential (primary) hypertension: Secondary | ICD-10-CM | POA: Diagnosis not present

## 2020-03-31 DIAGNOSIS — E441 Mild protein-calorie malnutrition: Secondary | ICD-10-CM | POA: Diagnosis not present

## 2020-03-31 DIAGNOSIS — F39 Unspecified mood [affective] disorder: Secondary | ICD-10-CM | POA: Diagnosis not present

## 2020-03-31 DIAGNOSIS — G301 Alzheimer's disease with late onset: Secondary | ICD-10-CM | POA: Diagnosis not present

## 2020-05-02 DIAGNOSIS — B351 Tinea unguium: Secondary | ICD-10-CM | POA: Diagnosis not present

## 2020-05-28 DIAGNOSIS — E039 Hypothyroidism, unspecified: Secondary | ICD-10-CM | POA: Diagnosis not present

## 2020-05-28 DIAGNOSIS — M1990 Unspecified osteoarthritis, unspecified site: Secondary | ICD-10-CM

## 2020-05-28 DIAGNOSIS — E43 Unspecified severe protein-calorie malnutrition: Secondary | ICD-10-CM | POA: Diagnosis not present

## 2020-05-28 DIAGNOSIS — I10 Essential (primary) hypertension: Secondary | ICD-10-CM | POA: Diagnosis not present

## 2020-05-28 DIAGNOSIS — H353290 Exudative age-related macular degeneration, unspecified eye, stage unspecified: Secondary | ICD-10-CM

## 2020-05-28 DIAGNOSIS — D649 Anemia, unspecified: Secondary | ICD-10-CM

## 2020-05-28 DIAGNOSIS — G309 Alzheimer's disease, unspecified: Secondary | ICD-10-CM | POA: Diagnosis not present

## 2020-07-04 DIAGNOSIS — F0391 Unspecified dementia with behavioral disturbance: Secondary | ICD-10-CM

## 2020-07-04 DIAGNOSIS — F39 Unspecified mood [affective] disorder: Secondary | ICD-10-CM | POA: Diagnosis not present

## 2020-07-07 DIAGNOSIS — F22 Delusional disorders: Secondary | ICD-10-CM

## 2020-07-24 DIAGNOSIS — F22 Delusional disorders: Secondary | ICD-10-CM | POA: Diagnosis not present

## 2020-07-24 DIAGNOSIS — G301 Alzheimer's disease with late onset: Secondary | ICD-10-CM | POA: Diagnosis not present

## 2020-07-24 DIAGNOSIS — F39 Unspecified mood [affective] disorder: Secondary | ICD-10-CM | POA: Diagnosis not present

## 2020-07-24 DIAGNOSIS — E039 Hypothyroidism, unspecified: Secondary | ICD-10-CM | POA: Diagnosis not present

## 2020-08-22 DIAGNOSIS — B351 Tinea unguium: Secondary | ICD-10-CM

## 2020-09-19 DIAGNOSIS — D649 Anemia, unspecified: Secondary | ICD-10-CM

## 2020-09-19 DIAGNOSIS — G309 Alzheimer's disease, unspecified: Secondary | ICD-10-CM | POA: Diagnosis not present

## 2020-09-19 DIAGNOSIS — F39 Unspecified mood [affective] disorder: Secondary | ICD-10-CM | POA: Diagnosis not present

## 2020-09-19 DIAGNOSIS — F22 Delusional disorders: Secondary | ICD-10-CM | POA: Diagnosis not present

## 2020-09-19 DIAGNOSIS — E43 Unspecified severe protein-calorie malnutrition: Secondary | ICD-10-CM

## 2020-09-19 DIAGNOSIS — H353210 Exudative age-related macular degeneration, right eye, stage unspecified: Secondary | ICD-10-CM

## 2020-09-19 DIAGNOSIS — M1990 Unspecified osteoarthritis, unspecified site: Secondary | ICD-10-CM

## 2020-09-19 DIAGNOSIS — E039 Hypothyroidism, unspecified: Secondary | ICD-10-CM | POA: Diagnosis not present

## 2020-09-19 DIAGNOSIS — I10 Essential (primary) hypertension: Secondary | ICD-10-CM

## 2020-09-29 ENCOUNTER — Other Ambulatory Visit: Payer: Self-pay

## 2020-09-29 ENCOUNTER — Emergency Department: Payer: Medicare Other

## 2020-09-29 ENCOUNTER — Inpatient Hospital Stay
Admission: EM | Admit: 2020-09-29 | Discharge: 2020-10-04 | DRG: 481 | Disposition: A | Discharge status: Skilled Nursing Facility | Payer: Medicare Other | Source: Skilled Nursing Facility | Attending: Internal Medicine | Admitting: Internal Medicine

## 2020-09-29 DIAGNOSIS — I10 Essential (primary) hypertension: Secondary | ICD-10-CM | POA: Diagnosis not present

## 2020-09-29 DIAGNOSIS — D62 Acute posthemorrhagic anemia: Secondary | ICD-10-CM | POA: Diagnosis not present

## 2020-09-29 DIAGNOSIS — W19XXXA Unspecified fall, initial encounter: Secondary | ICD-10-CM | POA: Diagnosis present

## 2020-09-29 DIAGNOSIS — S72145A Nondisplaced intertrochanteric fracture of left femur, initial encounter for closed fracture: Secondary | ICD-10-CM | POA: Diagnosis not present

## 2020-09-29 DIAGNOSIS — Z888 Allergy status to other drugs, medicaments and biological substances status: Secondary | ICD-10-CM

## 2020-09-29 DIAGNOSIS — Z419 Encounter for procedure for purposes other than remedying health state, unspecified: Secondary | ICD-10-CM

## 2020-09-29 DIAGNOSIS — Z20822 Contact with and (suspected) exposure to covid-19: Secondary | ICD-10-CM | POA: Diagnosis present

## 2020-09-29 DIAGNOSIS — Z9049 Acquired absence of other specified parts of digestive tract: Secondary | ICD-10-CM

## 2020-09-29 DIAGNOSIS — Z85038 Personal history of other malignant neoplasm of large intestine: Secondary | ICD-10-CM

## 2020-09-29 DIAGNOSIS — F32A Depression, unspecified: Secondary | ICD-10-CM

## 2020-09-29 DIAGNOSIS — Z87891 Personal history of nicotine dependence: Secondary | ICD-10-CM

## 2020-09-29 DIAGNOSIS — E039 Hypothyroidism, unspecified: Secondary | ICD-10-CM | POA: Diagnosis not present

## 2020-09-29 DIAGNOSIS — Z79899 Other long term (current) drug therapy: Secondary | ICD-10-CM

## 2020-09-29 DIAGNOSIS — D509 Iron deficiency anemia, unspecified: Secondary | ICD-10-CM | POA: Diagnosis present

## 2020-09-29 DIAGNOSIS — R0902 Hypoxemia: Secondary | ICD-10-CM

## 2020-09-29 DIAGNOSIS — F0390 Unspecified dementia without behavioral disturbance: Secondary | ICD-10-CM | POA: Diagnosis not present

## 2020-09-29 DIAGNOSIS — G301 Alzheimer's disease with late onset: Secondary | ICD-10-CM | POA: Diagnosis present

## 2020-09-29 DIAGNOSIS — S72142A Displaced intertrochanteric fracture of left femur, initial encounter for closed fracture: Secondary | ICD-10-CM | POA: Diagnosis present

## 2020-09-29 DIAGNOSIS — Z91040 Latex allergy status: Secondary | ICD-10-CM

## 2020-09-29 DIAGNOSIS — Z91048 Other nonmedicinal substance allergy status: Secondary | ICD-10-CM

## 2020-09-29 DIAGNOSIS — E871 Hypo-osmolality and hyponatremia: Secondary | ICD-10-CM | POA: Diagnosis present

## 2020-09-29 DIAGNOSIS — S72002A Fracture of unspecified part of neck of left femur, initial encounter for closed fracture: Principal | ICD-10-CM | POA: Diagnosis present

## 2020-09-29 DIAGNOSIS — F02C3 Dementia in other diseases classified elsewhere, severe, with mood disturbance: Secondary | ICD-10-CM | POA: Diagnosis present

## 2020-09-29 DIAGNOSIS — Z66 Do not resuscitate: Secondary | ICD-10-CM | POA: Diagnosis present

## 2020-09-29 DIAGNOSIS — H3530 Unspecified macular degeneration: Secondary | ICD-10-CM | POA: Diagnosis present

## 2020-09-29 DIAGNOSIS — M25552 Pain in left hip: Secondary | ICD-10-CM | POA: Diagnosis not present

## 2020-09-29 DIAGNOSIS — R710 Precipitous drop in hematocrit: Secondary | ICD-10-CM

## 2020-09-29 DIAGNOSIS — Z993 Dependence on wheelchair: Secondary | ICD-10-CM

## 2020-09-29 LAB — CBC WITH DIFFERENTIAL/PLATELET
Abs Immature Granulocytes: 0.05 10*3/uL (ref 0.00–0.07)
Basophils Absolute: 0 10*3/uL (ref 0.0–0.1)
Basophils Relative: 0 %
Eosinophils Absolute: 0.1 10*3/uL (ref 0.0–0.5)
Eosinophils Relative: 1 %
HCT: 37.1 % (ref 36.0–46.0)
Hemoglobin: 12.8 g/dL (ref 12.0–15.0)
Immature Granulocytes: 0 %
Lymphocytes Relative: 10 %
Lymphs Abs: 1.1 10*3/uL (ref 0.7–4.0)
MCH: 29.6 pg (ref 26.0–34.0)
MCHC: 34.5 g/dL (ref 30.0–36.0)
MCV: 85.7 fL (ref 80.0–100.0)
Monocytes Absolute: 0.7 10*3/uL (ref 0.1–1.0)
Monocytes Relative: 6 %
Neutro Abs: 9.3 10*3/uL — ABNORMAL HIGH (ref 1.7–7.7)
Neutrophils Relative %: 83 %
Platelets: 224 10*3/uL (ref 150–400)
RBC: 4.33 MIL/uL (ref 3.87–5.11)
RDW: 13.5 % (ref 11.5–15.5)
WBC: 11.3 10*3/uL — ABNORMAL HIGH (ref 4.0–10.5)
nRBC: 0 % (ref 0.0–0.2)

## 2020-09-29 LAB — RESP PANEL BY RT-PCR (FLU A&B, COVID) ARPGX2
Influenza A by PCR: NEGATIVE
Influenza B by PCR: NEGATIVE
SARS Coronavirus 2 by RT PCR: NEGATIVE

## 2020-09-29 LAB — URINALYSIS, COMPLETE (UACMP) WITH MICROSCOPIC
Bacteria, UA: NONE SEEN
Bilirubin Urine: NEGATIVE
Glucose, UA: NEGATIVE mg/dL
Hgb urine dipstick: NEGATIVE
Ketones, ur: NEGATIVE mg/dL
Leukocytes,Ua: NEGATIVE
Nitrite: NEGATIVE
Protein, ur: NEGATIVE mg/dL
Specific Gravity, Urine: 1.006 (ref 1.005–1.030)
Squamous Epithelial / HPF: NONE SEEN (ref 0–5)
pH: 7 (ref 5.0–8.0)

## 2020-09-29 LAB — BASIC METABOLIC PANEL
Anion gap: 8 (ref 5–15)
BUN: 11 mg/dL (ref 8–23)
CO2: 25 mmol/L (ref 22–32)
Calcium: 8.9 mg/dL (ref 8.9–10.3)
Chloride: 100 mmol/L (ref 98–111)
Creatinine, Ser: 0.53 mg/dL (ref 0.44–1.00)
GFR, Estimated: >60 mL/min (ref >60)
Glucose, Bld: 92 mg/dL (ref 70–99)
Potassium: 3.6 mmol/L (ref 3.5–5.1)
Sodium: 133 mmol/L — ABNORMAL LOW (ref 135–145)

## 2020-09-29 LAB — PROTIME-INR
INR: 1.1 (ref 0.8–1.2)
Prothrombin Time: 14.2 seconds (ref 11.4–15.2)

## 2020-09-29 MED ORDER — TRANEXAMIC ACID-NACL 1000-0.7 MG/100ML-% IV SOLN
1000.0000 mg | Freq: Once | INTRAVENOUS | Status: AC
  Administered 2020-09-30: 1000 mg via INTRAVENOUS
  Filled 2020-09-29: qty 100

## 2020-09-29 MED ORDER — CALCIUM-MAGNESIUM 500-250 MG PO TABS: 2.0000 | ORAL_TABLET | Freq: Two times a day (BID) | ORAL | Status: DC

## 2020-09-29 MED ORDER — POLYETHYLENE GLYCOL 3350 17 G PO PACK: 17.0000 g | PACK | Freq: Every day | ORAL | Status: DC | PRN

## 2020-09-29 MED ORDER — FERROUS SULFATE 325 (65 FE) MG PO TABS: 325.0000 mg | ORAL_TABLET | Freq: Two times a day (BID) | ORAL | Status: DC

## 2020-09-29 MED ORDER — VITAMIN D 25 MCG (1000 UNIT) PO TABS
1000.0000 [IU] | ORAL_TABLET | Freq: Every day | ORAL | Status: DC
  Administered 2020-09-30: 1000 [IU] via ORAL
  Filled 2020-09-29: qty 1

## 2020-09-29 MED ORDER — MAGNESIUM HYDROXIDE 400 MG/5ML PO SUSP: 30.0000 mL | Freq: Every day | ORAL | Status: DC | PRN

## 2020-09-29 MED ORDER — FENTANYL CITRATE (PF) 100 MCG/2ML IJ SOLN
50.0000 ug | Freq: Once | INTRAMUSCULAR | Status: AC
  Administered 2020-09-30: 50 ug via INTRAVENOUS
  Filled 2020-09-29: qty 2

## 2020-09-29 MED ORDER — ONDANSETRON HCL 4 MG/2ML IJ SOLN
4.0000 mg | Freq: Once | INTRAMUSCULAR | Status: AC
  Administered 2020-09-30: 4 mg via INTRAVENOUS
  Filled 2020-09-29: qty 2

## 2020-09-29 MED ORDER — POLYVINYL ALCOHOL 1.4 % OP SOLN
1.0000 [drp] | Freq: Two times a day (BID) | OPHTHALMIC | Status: DC | PRN
  Filled 2020-09-29: qty 15

## 2020-09-29 MED ORDER — THYROID 97.5 MG PO TABS: 100.0000 mg | ORAL_TABLET | Freq: Every day | ORAL | Status: DC

## 2020-09-29 MED ORDER — AMLODIPINE BESYLATE 5 MG PO TABS
5.0000 mg | ORAL_TABLET | Freq: Every day | ORAL | Status: DC
  Administered 2020-09-30: 5 mg via ORAL
  Filled 2020-09-29: qty 1

## 2020-09-29 MED ORDER — DOCUSATE SODIUM 100 MG PO CAPS: 100.0000 mg | ORAL_CAPSULE | Freq: Two times a day (BID) | ORAL | Status: DC | PRN

## 2020-09-29 MED ORDER — HYDROMORPHONE HCL 1 MG/ML IJ SOLN
0.5000 mg | INTRAMUSCULAR | Status: DC | PRN
Start: 2020-09-30 — End: 2020-09-30
  Administered 2020-09-30 (×2): 0.5 mg via INTRAVENOUS
  Filled 2020-09-29 (×2): qty 1

## 2020-09-29 NOTE — ED Notes (Signed)
Spoke with patient's daughter, Angelita Ingles, and provided an update. Daughter is requesting a call back with an update when tests have resulted. [phone]

## 2020-09-29 NOTE — ED Provider Notes (Signed)
[facility] Emergency Department Provider Note ____________________________________________  Time seen: Approximately 8:26 PM  I have reviewed the triage vital signs and the nursing notes.   HISTORY  Chief Complaint Fall    HPI Sonya Carey is a 87 y.o. female who presents to the emergency department for evaluation and treatment of left hip pain.  Patient states that she was going to the bathroom and tripped.  She denies striking her head or losing consciousness.  EMS report that the staff found her in the floor.  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   Depression    Fracture, metacarpal 06/2016   5th metacarpal   History of chicken pox    Hypertension    Hypothyroidism    Macular degeneration    left eye   Thyroid disease     Patient Active Problem List   Diagnosis Date Noted   Iron deficiency anemia    Fall    Pneumothorax    Multiple rib fractures 03/01/2019   DNI (do not intubate) 03/01/2019    Past Surgical History:  Procedure Laterality Date   CARPAL TUNNEL RELEASE  12/09/2010   CHOLECYSTECTOMY  1995   COLON SURGERY  02/25/2010   transverse colon resection   COLONOSCOPY     ESOPHAGOGASTRODUODENOSCOPY  02/25/2010   GANGLION CYST EXCISION Right 07/26/2016   Procedure: REMOVAL GANGLION OF WRIST;  Surgeon: Dereck Leep, MD;  Location: [facility] ORS;  Service: Orthopedics;  Laterality: Right;   GANGLION CYST EXCISION Right 11/10/2016   Procedure: REMOVAL GANGLION OF WRIST;  Surgeon: Dereck Leep, MD;  Location: [facility] ORS;  Service: Orthopedics;  Laterality: Right;   HERNIA REPAIR     MULTIPLE TOOTH EXTRACTIONS  06/2015   for dentures   UMBILICAL HERNIA REPAIR  02/25/2010    Prior to Admission medications   Medication Sig Start Date End Date Taking? Authorizing Provider  amLODipine (NORVASC) 5 MG tablet Take 5 mg by mouth daily.    [provider]   Calcium-Magnesium 500-250 MG TABS Take 2 tablets by mouth 2 (two) times daily.    [provider]  cetirizine (ZYRTEC) 10 MG tablet Take 10 mg by mouth daily.    [provider]  chlorhexidine (PERIDEX) 0.12 % solution as directed.    [provider]  cholecalciferol (VITAMIN D) 1000 units tablet Take 1,000 Units by mouth daily.    [provider]  docusate sodium (COLACE) 100 MG capsule Take 1 capsule (100 mg total) by mouth 2 (two) times daily as needed for mild constipation. 03/06/19   Lorella Nimrod, MD  donepezil (ARICEPT) 10 MG tablet Take 10 mg by mouth at bedtime.     [provider]  ferrous sulfate 325 (65 FE) MG tablet Take 1 tablet (325 mg total) by mouth 2 (two) times daily with a meal. 03/06/19   Lorella Nimrod, MD  hydroxypropyl methylcellulose / hypromellose (ISOPTO TEARS / GONIOVISC) 2.5 % ophthalmic solution Place 1 drop into both eyes 2 (two) times daily as needed for dry eyes.    [provider]  lidocaine (LIDODERM) 5 % Place 1 patch onto the skin daily. Remove & Discard patch within 12 hours or as directed by MD 03/07/19   Lorella Nimrod, MD  magnesium hydroxide (MILK OF MAGNESIA) 400 MG/5ML suspension Take 30 mLs by mouth daily as needed for mild constipation. 03/06/19   Lorella Nimrod, MD  Multiple Vitamins-Minerals (MULTIVITAMIN  ADULT PO) Take 7 tablets by mouth daily. SHAKLEE "LIFE STRIP" MULTIVITAMIN PACK    [provider]  OVER THE COUNTER MEDICATION Take 1 tablet by mouth daily. [place]    [provider]  OVER THE COUNTER MEDICATION Take 2 tablets by mouth daily. [place]    [provider]  oxyCODONE (OXY IR/ROXICODONE) 5 MG immediate release tablet Take 1 tablet (5 mg total) by mouth every 4 (four) hours as needed for moderate pain. 03/06/19   Lorella Nimrod, MD  sertraline (ZOLOFT) 100 MG tablet Take 100 mg by mouth at bedtime.     [provider]  thyroid 97.5  MG TABS Take 1.0256 tablets (100 mg total) by mouth daily before breakfast. 03/07/19   Lorella Nimrod, MD  triamcinolone cream (KENALOG) 0.1 % Apply 1 application topically daily as needed (skin breakdown).    [provider]    Allergies Celebrex [celecoxib], Latex, Statins, and Adhesive [tape]  Family History  Problem Relation Age of Onset   Dementia Mother     Social History Social History   Tobacco Use   Smoking status: Former   Smokeless tobacco: Never  Scientific laboratory technician Use: Never used  Substance Use Topics   Alcohol use: No   Drug use: No    Review of Systems Constitutional: Negative for fever. Cardiovascular: Negative for chest pain. Respiratory: Negative for shortness of breath. Musculoskeletal: Positive for left hip pain. Skin: Negative for open wounds or lesions.  Neurological: Negative for decrease in sensation  ____________________________________________   PHYSICAL EXAM:  VITAL SIGNS: ED Triage Vitals  Enc Vitals Group     BP 09/29/20 1741 (!) 152/72     Pulse Rate 09/29/20 1741 77     Resp 09/29/20 1741 18     Temp 09/29/20 1741 98.7 F (37.1 C)     Temp src --      SpO2 09/29/20 1741 100 %     Weight --      Height --      Head Circumference --      Peak Flow --      Pain Score 09/29/20 1736 6     Pain Loc --      Pain Edu? --      Excl. in [place]? --     Constitutional: Alert and oriented. Well appearing and in no acute distress. Eyes: Conjunctivae are clear without discharge or drainage Head: Atraumatic Neck: Supple Respiratory: No cough. Respirations are even and unlabored. Musculoskeletal: Pain in left hip with internal and external rotation. Neurologic: Awake, alert, oriented to person and place.  Skin: No open wounds over the left hip.   Psychiatric: Affect and behavior are appropriate.  ____________________________________________   LABS (all labs ordered are listed, but only abnormal results are displayed)  Labs  Reviewed  URINALYSIS, COMPLETE (UACMP) WITH MICROSCOPIC - Abnormal; Notable for the following components:      Result Value   Color, Urine STRAW (*)    APPearance CLEAR (*)    All other components within normal limits  BASIC METABOLIC PANEL - Abnormal; Notable for the following components:   Sodium 133 (*)    All other components within normal limits  CBC WITH DIFFERENTIAL/PLATELET - Abnormal; Notable for the following components:   WBC 11.3 (*)    Neutro Abs 9.3 (*)    All other components within normal limits  RESP PANEL BY RT-PCR (FLU A&B, COVID) [id number]  PROTIME-INR   ____________________________________________  RADIOLOGY  X-ray image of the left hip shows a questionable lucency at the greater trochanter of the left femur.  Radiology suggest CT for further clarification.  CT image of the left hip shows a nondisplaced greater trochanter fracture that does not extend into the intertrochanteric space.  I, Sherrie George, personally viewed and evaluated these images (plain radiographs) as part of my medical decision making, as well as reviewing the written report by the radiologist.  DG Chest 1 View  Result Date: 09/29/2020 CLINICAL DATA:  Unwitnessed fall EXAM: CHEST  1 VIEW COMPARISON:  03/03/2019, FINDINGS: Small right-sided pleural effusion with hazy atelectasis right base. Diffuse coarse chronic interstitial opacity. Normal cardiac size. Aortic atherosclerosis. No pneumothorax. Scoliosis. Severe degenerative changes of both shoulders. IMPRESSION: Coarse chronic interstitial opacity with small right pleural effusion. Electronically Signed   By: Donavan Foil M.D.   On: 09/29/2020 20:13   CT Head Wo Contrast  Result Date: 09/29/2020 CLINICAL DATA:  Found down.  Fall EXAM: CT HEAD WITHOUT CONTRAST CT CERVICAL SPINE WITHOUT CONTRAST TECHNIQUE: Multidetector CT imaging of the head and cervical spine was performed following the standard protocol without intravenous contrast. Multiplanar  CT image reconstructions of the cervical spine were also generated. COMPARISON:  03/01/2019 FINDINGS: CT HEAD FINDINGS Brain: There is no mass, hemorrhage or extra-axial collection. There is generalized atrophy without lobar predilection. There is hypoattenuation of the periventricular white matter, most commonly indicating chronic ischemic microangiopathy. Vascular: No hyperdense vessel or unexpected vascular calcification. Skull: The visualized skull base, calvarium and extracranial soft tissues are normal. Sinuses/Orbits: No fluid levels or advanced mucosal thickening of the visualized paranasal sinuses. No mastoid or middle ear effusion. The orbits are normal. CT CERVICAL SPINE FINDINGS Alignment: No static subluxation. Facets are aligned. Occipital condyles are normally positioned. Skull base and vertebrae: No acute fracture. Soft tissues and spinal canal: No prevertebral fluid or swelling. No visible canal hematoma. Disc levels: No advanced spinal canal or neural foraminal stenosis. Upper chest: No pneumothorax, pulmonary nodule or pleural effusion. Other: Calcific aortic atherosclerosis. IMPRESSION: 1. Chronic ischemic microangiopathy and generalized atrophy without acute intracranial abnormality. 2. No acute fracture or static subluxation of the cervical spine. Aortic Atherosclerosis (ICD10-I70.0). Electronically Signed   By: Ulyses Jarred M.D.   On: 09/29/2020 19:23   CT Cervical Spine Wo Contrast  Result Date: 09/29/2020 CLINICAL DATA:  Found down.  Fall EXAM: CT HEAD WITHOUT CONTRAST CT CERVICAL SPINE WITHOUT CONTRAST TECHNIQUE: Multidetector CT imaging of the head and cervical spine was performed following the standard protocol without intravenous contrast. Multiplanar CT image reconstructions of the cervical spine were also generated. COMPARISON:  03/01/2019 FINDINGS: CT HEAD FINDINGS Brain: There is no mass, hemorrhage or extra-axial collection. There is generalized atrophy without lobar  predilection. There is hypoattenuation of the periventricular white matter, most commonly indicating chronic ischemic microangiopathy. Vascular: No hyperdense vessel or unexpected vascular calcification. Skull: The visualized skull base, calvarium and extracranial soft tissues are normal. Sinuses/Orbits: No fluid levels or advanced mucosal thickening of the visualized paranasal sinuses. No mastoid or middle ear effusion. The orbits are normal. CT CERVICAL SPINE FINDINGS Alignment: No static subluxation. Facets are aligned. Occipital condyles are normally positioned. Skull base and vertebrae: No acute fracture. Soft tissues and spinal canal: No prevertebral fluid or swelling. No visible canal hematoma. Disc levels: No advanced spinal canal or neural foraminal stenosis. Upper chest: No pneumothorax, pulmonary nodule or pleural effusion. Other: Calcific aortic atherosclerosis. IMPRESSION: 1. Chronic ischemic microangiopathy and generalized atrophy without acute intracranial  abnormality. 2. No acute fracture or static subluxation of the cervical spine. Aortic Atherosclerosis (ICD10-I70.0). Electronically Signed   By: Ulyses Jarred M.D.   On: 09/29/2020 19:23   CT Hip Left Wo Contrast  Result Date: 09/29/2020 CLINICAL DATA:  Left hip pain after a fall. Possible greater trochanter fracture on prior plain films Initial encounter. EXAM: CT OF THE LEFT HIP WITHOUT CONTRAST TECHNIQUE: Multidetector CT imaging of the left hip was performed according to the standard protocol. Multiplanar CT image reconstructions were also generated. COMPARISON:  Plain films left hip today. FINDINGS: Bones/Joint/Cartilage The patient has an acute, nondisplaced fracture of the posterior aspect of the greater trochanter no intertrochanteric extension is identified. No other fracture is seen. The left hip is located. No lytic or sclerotic lesion. Mild left hip degenerative change noted. Ligaments Suboptimally assessed by CT. Muscles and Tendons  Negative. Soft tissues Negative. IMPRESSION: Acute nondisplaced fracture of the greater trochanter as described. Electronically Signed   By: Inge Rise M.D.   On: 09/29/2020 21:34   DG Hip Unilat With Pelvis 2-3 Views Left  Result Date: 09/29/2020 CLINICAL DATA:  Fall EXAM: DG HIP (WITH OR WITHOUT PELVIS) 2-3V LEFT COMPARISON:  CT 03/04/2019 FINDINGS: SI joints are non widened. Pubic symphysis and rami are intact. There is questionable lucency at the greater trochanter of left femur on one view IMPRESSION: Questionable lucency at the greater trochanter of left femur. Suggest CT for further evaluation Electronically Signed   By: Donavan Foil M.D.   On: 09/29/2020 20:12   ____________________________________________   PROCEDURES  Procedures  ____________________________________________   INITIAL IMPRESSION / ASSESSMENT AND PLAN / ED COURSE  Sonya Carey is a 87 y.o. who presents to the emergency department for treatment and evaluation after unwitnessed fall.  See HPI for further details.  Patient is awake, alert, and oriented to person and place.  She states that she was going to the bathroom, tripped, and fell.  She states that she did not hit her head.  X-ray of the hip is inconclusive.  CT has been ordered.  CT of the hip shows a nondisplaced greater trochanteric fracture.  Patient is unable to tolerate internal or external rotation and is unable to tolerate any attempt at weightbearing.  Case discussed with Dr. Leim Fabry who requests MRI of the hip.  COVID test and basic lab studies sent to lab while awaiting results of the MRI.  Per verbal report from radiology, fracture does extend into the intertrochanteric space.  Secure chat message sent to Dr. Posey Pronto to advise him that I will plan for admission via hospitalist services.  Hospitalist consult requested.  Dr. Posey Pronto requesting TXA 1g now and adding PT INR.  Patient accepted for admission.   Medications  fentaNYL  (SUBLIMAZE) injection 50 mcg (has no administration in time range)  ondansetron (ZOFRAN) injection 4 mg (has no administration in time range)  tranexamic acid (CYKLOKAPRON) IVPB 1,000 mg (has no administration in time range)    Pertinent labs & imaging results that were available during my care of the patient were reviewed by me and considered in my medical decision making (see chart for details).   _________________________________________   FINAL CLINICAL IMPRESSION(S) / ED DIAGNOSES  Final diagnoses:  Fall  Hip fracture, left, closed, initial encounter [facility])    ED Discharge Orders     None        If controlled substance prescribed during this visit, 12 month history viewed on the [place] prior to issuing  an initial prescription for Schedule II or III opiod.    Victorino Dike, FNP 09/29/20 2351    Nena Polio, MD 09/30/20 0120

## 2020-09-29 NOTE — ED Triage Notes (Addendum)
Pt come with c/o unwitnessed fall. Pt is from [facility] and was found in floor by staff. Pt states left hip pain.  Pt was in wheelchair when EMS got there. Pt states she tripped while going to the bathroom. 155/71 88 93%RA  Pt has dementia and is at baseline per EMS

## 2020-09-29 NOTE — Progress Notes (Signed)
Full consult note to follow tomorrow.   Called by ED staff. Imaging including MRI reviewed. I had an extensive discussion with the patient's daughter, Angelita Ingles @[phone], regarding the injury and treatment options. - Plan for surgery tomorrow, likely ~1230pm. Surgery would consist of L hip IM nailing. - NPO after midnight - Hold anticoagulation - Admit to Hospitalist team. Please medically optimize.

## 2020-09-29 NOTE — H&P (Signed)
History and Physical   BETHLEHEM LANGSTAFF LTR:[id number] DOB: [birth year] DOA: 09/29/2020  PCP: Tracie Harrier, MD   Patient coming from: [facility] facility  Chief Complaint: Fall, hip pain  HPI: Sonya Carey is a 87 y.o. female with medical history significant of dementia, iron deficiency anemia, falls, colon cancer, depression, hypertension, hypothyroidism, macular degeneration of the left eye who presents after an unwitnessed fall at her facility. History obtained with assistance of chart review.  As above patient had an unwitnessed fall earlier today at her facility.  She is found on the floor by staff.  Patient states that she tripped while heading to the bathroom.  She subsequently noted left hip pain.  Staff helped her to wheelchair and then contacted EMS.  Patient is at her baseline mentation with her dementia.  She waxes and wanes some.  She has denied any head trauma nor loss of consciousness.  She is not taking any blood thinners.  In addition to soreness in her L hip, she also reports aching in her right leg and bilateral arms and shoulders. Daughter reports shoulder pain is chronic and she has some pain from prior rib fractures. She denies fevers, chills, chest pain, shortness of breath, abdominal pain, constipation, diarrhea, nausea, vomiting.  ED Course: Vital signs in the ED are stable significant for blood pressure 686 systolic.  Lab work-up showed BMP with sodium 133.  CBC showed leukocytosis to 11.3.  PT and INR pending.  Respiratory panel for flu and COVID-negative.  Urinalysis WNL.  Imaging showed chest x-ray with coarse chronic opacity and small right pleural effusion.  Left hip x-ray with questionable lucency at the left femur.  Left hip CT with nondisplaced fractureof the greater trochanter.  Left hip MRI ordered and has not been resulted but reportedly confirmed fracture.  CT head and CT C-spine were without acute abnormality.  Patient received dose of fentanyl, Zofran  and transischemic acid.  Orthopedics was consulted and requested the MRI and the dose of transischemic acid.  They will be following and plan for possible surgery in the morning.  Review of Systems: As per HPI otherwise all other systems reviewed and are negative.  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[facility])    adenocarcinoma of transverse colon   Depression    Fracture, metacarpal 06/2016   5th metacarpal   History of chicken pox    Hypertension    Hypothyroidism    Macular degeneration    left eye   Thyroid disease     Past Surgical History:  Procedure Laterality Date   CARPAL TUNNEL RELEASE  12/09/2010   CHOLECYSTECTOMY  1995   COLON SURGERY  02/25/2010   transverse colon resection   COLONOSCOPY     ESOPHAGOGASTRODUODENOSCOPY  02/25/2010   GANGLION CYST EXCISION Right 07/26/2016   Procedure: REMOVAL GANGLION OF WRIST;  Surgeon: Dereck Leep, MD;  Location: [facility] ORS;  Service: Orthopedics;  Laterality: Right;   GANGLION CYST EXCISION Right 11/10/2016   Procedure: REMOVAL GANGLION OF WRIST;  Surgeon: Dereck Leep, MD;  Location: [facility] ORS;  Service: Orthopedics;  Laterality: Right;   HERNIA REPAIR     MULTIPLE TOOTH EXTRACTIONS  06/2015   for dentures   UMBILICAL HERNIA REPAIR  02/25/2010    Social History  reports that she has quit smoking. She has never used smokeless tobacco. She reports that she does not drink alcohol and does not use drugs.  Allergies  Allergen Reactions   Celebrex [  Celecoxib] Other (See Comments)    GI upset   Latex Itching    NEGATIVE LATEX IgE (< 0.10) 11/04/2016   Statins Other (See Comments)    Muscle pain   Adhesive [Tape] Rash    Family History  Problem Relation Age of Onset   Dementia Mother   Reviewed on admission  Prior to Admission medications   Medication Sig Start Date End Date Taking? Authorizing Provider  amLODipine (NORVASC) 5 MG tablet Take 5 mg by mouth daily.    [provider]   Calcium-Magnesium 500-250 MG TABS Take 2 tablets by mouth 2 (two) times daily.    [provider]  cetirizine (ZYRTEC) 10 MG tablet Take 10 mg by mouth daily.    [provider]  chlorhexidine (PERIDEX) 0.12 % solution as directed.    [provider]  cholecalciferol (VITAMIN D) 1000 units tablet Take 1,000 Units by mouth daily.    [provider]  docusate sodium (COLACE) 100 MG capsule Take 1 capsule (100 mg total) by mouth 2 (two) times daily as needed for mild constipation. 03/06/19   Lorella Nimrod, MD  donepezil (ARICEPT) 10 MG tablet Take 10 mg by mouth at bedtime.     [provider]  ferrous sulfate 325 (65 FE) MG tablet Take 1 tablet (325 mg total) by mouth 2 (two) times daily with a meal. 03/06/19   Lorella Nimrod, MD  hydroxypropyl methylcellulose / hypromellose (ISOPTO TEARS / GONIOVISC) 2.5 % ophthalmic solution Place 1 drop into both eyes 2 (two) times daily as needed for dry eyes.    [provider]  lidocaine (LIDODERM) 5 % Place 1 patch onto the skin daily. Remove & Discard patch within 12 hours or as directed by MD 03/07/19   Lorella Nimrod, MD  magnesium hydroxide (MILK OF MAGNESIA) 400 MG/5ML suspension Take 30 mLs by mouth daily as needed for mild constipation. 03/06/19   Lorella Nimrod, MD  Multiple Vitamins-Minerals (MULTIVITAMIN ADULT PO) Take 7 tablets by mouth daily. SHAKLEE "LIFE STRIP" MULTIVITAMIN PACK    [provider]  OVER THE COUNTER MEDICATION Take 1 tablet by mouth daily. [place]    [provider]  OVER THE COUNTER MEDICATION Take 2 tablets by mouth daily. [place]    [provider]  oxyCODONE (OXY IR/ROXICODONE) 5 MG immediate release tablet Take 1 tablet (5 mg total) by mouth every 4 (four) hours as needed for moderate pain. 03/06/19   Lorella Nimrod, MD  sertraline (ZOLOFT) 100 MG tablet Take 100 mg by mouth at bedtime.     [provider]  thyroid 97.5  MG TABS Take 1.0256 tablets (100 mg total) by mouth daily before breakfast. 03/07/19   Lorella Nimrod, MD  triamcinolone cream (KENALOG) 0.1 % Apply 1 application topically daily as needed (skin breakdown).    [provider]    Physical Exam: Vitals:   09/29/20 1741  BP: (!) 152/72  Pulse: 77  Resp: 18  Temp: 98.7 F (37.1 C)  SpO2: 100%   Physical Exam Constitutional:      General: She is not in acute distress.    Appearance: Normal appearance.  HENT:     Head: Normocephalic and atraumatic.     Mouth/Throat:     Mouth: Mucous membranes are moist.     Pharynx: Oropharynx is clear.  Eyes:     Extraocular Movements: Extraocular movements intact.     Pupils: Pupils are equal, round, and reactive to light.  Cardiovascular:     Rate and Rhythm: Normal rate and regular rhythm.     Pulses: Normal pulses.     Heart sounds: Normal heart sounds.  Pulmonary:     Effort: Pulmonary effort is normal. No respiratory distress.     Breath sounds: Normal breath sounds.  Abdominal:     General: Bowel sounds are normal. There is no distension.     Palpations: Abdomen is soft.     Tenderness: There is no abdominal tenderness.  Musculoskeletal:        General: Tenderness (Left hip) present. No swelling or deformity.     Comments: Bilateral lower extremities are neurovascularly intact.  Skin:    General: Skin is warm and dry.  Neurological:     General: No focal deficit present.     Mental Status: Mental status is at baseline.     Comments: Intermittently oriented.   Labs on Admission: I have personally reviewed following labs and imaging studies  CBC: Recent Labs  Lab 09/29/20 2237  WBC 11.3*  NEUTROABS 9.3*  HGB 12.8  HCT 37.1  MCV 85.7  PLT 818    Basic Metabolic Panel: Recent Labs  Lab 09/29/20 2237  NA 133*  K 3.6  CL 100  CO2 25  GLUCOSE 92  BUN 11  CREATININE 0.53  CALCIUM 8.9    GFR: CrCl cannot be calculated (Unknown ideal weight.).  Liver  Function Tests: No results for input(s): AST, ALT, ALKPHOS, BILITOT, PROT, ALBUMIN in the last 168 hours.  Urine analysis:    Component Value Date/Time   COLORURINE STRAW (A) 09/29/2020 2237   APPEARANCEUR CLEAR (A) 09/29/2020 2237   LABSPEC 1.006 09/29/2020 2237   PHURINE 7.0 09/29/2020 2237   GLUCOSEU NEGATIVE 09/29/2020 2237   HGBUR NEGATIVE 09/29/2020 2237   [place] NEGATIVE 09/29/2020 2237   KETONESUR NEGATIVE 09/29/2020 2237   PROTEINUR NEGATIVE 09/29/2020 2237   NITRITE NEGATIVE 09/29/2020 2237   LEUKOCYTESUR NEGATIVE 09/29/2020 2237    Radiological Exams on Admission: DG Chest 1 View  Result Date: 09/29/2020 CLINICAL DATA:  Unwitnessed fall EXAM: CHEST  1 VIEW COMPARISON:  03/03/2019, FINDINGS: Small right-sided pleural effusion with hazy atelectasis right base. Diffuse coarse chronic interstitial opacity. Normal cardiac size. Aortic atherosclerosis. No pneumothorax. Scoliosis. Severe degenerative changes of both shoulders. IMPRESSION: Coarse chronic interstitial opacity with small right pleural effusion. Electronically Signed   By: Donavan Foil M.D.   On: 09/29/2020 20:13   CT Head Wo Contrast  Result Date: 09/29/2020 CLINICAL DATA:  Found down.  Fall EXAM: CT HEAD WITHOUT CONTRAST CT CERVICAL SPINE WITHOUT CONTRAST TECHNIQUE: Multidetector CT imaging of the head and cervical spine was performed following the standard protocol without intravenous contrast. Multiplanar CT image reconstructions of the cervical spine were also generated. COMPARISON:  03/01/2019 FINDINGS: CT HEAD FINDINGS Brain: There is no mass, hemorrhage or extra-axial collection. There is generalized atrophy without lobar predilection. There is hypoattenuation of the periventricular white matter, most commonly indicating chronic ischemic microangiopathy. Vascular: No hyperdense vessel or unexpected vascular calcification. Skull: The visualized skull base, calvarium and extracranial soft tissues are normal.  Sinuses/Orbits: No fluid levels or advanced mucosal thickening of the visualized paranasal sinuses. No mastoid or middle ear effusion. The orbits are normal. CT CERVICAL SPINE FINDINGS Alignment: No static subluxation. Facets are aligned. Occipital condyles are normally positioned. Skull base and vertebrae: No acute fracture. Soft tissues and spinal canal: No prevertebral fluid or swelling. No visible canal hematoma. Disc levels: No advanced spinal canal  or neural foraminal stenosis. Upper chest: No pneumothorax, pulmonary nodule or pleural effusion. Other: Calcific aortic atherosclerosis. IMPRESSION: 1. Chronic ischemic microangiopathy and generalized atrophy without acute intracranial abnormality. 2. No acute fracture or static subluxation of the cervical spine. Aortic Atherosclerosis (ICD10-I70.0). Electronically Signed   By: Ulyses Jarred M.D.   On: 09/29/2020 19:23   CT Cervical Spine Wo Contrast  Result Date: 09/29/2020 CLINICAL DATA:  Found down.  Fall EXAM: CT HEAD WITHOUT CONTRAST CT CERVICAL SPINE WITHOUT CONTRAST TECHNIQUE: Multidetector CT imaging of the head and cervical spine was performed following the standard protocol without intravenous contrast. Multiplanar CT image reconstructions of the cervical spine were also generated. COMPARISON:  03/01/2019 FINDINGS: CT HEAD FINDINGS Brain: There is no mass, hemorrhage or extra-axial collection. There is generalized atrophy without lobar predilection. There is hypoattenuation of the periventricular white matter, most commonly indicating chronic ischemic microangiopathy. Vascular: No hyperdense vessel or unexpected vascular calcification. Skull: The visualized skull base, calvarium and extracranial soft tissues are normal. Sinuses/Orbits: No fluid levels or advanced mucosal thickening of the visualized paranasal sinuses. No mastoid or middle ear effusion. The orbits are normal. CT CERVICAL SPINE FINDINGS Alignment: No static subluxation. Facets are  aligned. Occipital condyles are normally positioned. Skull base and vertebrae: No acute fracture. Soft tissues and spinal canal: No prevertebral fluid or swelling. No visible canal hematoma. Disc levels: No advanced spinal canal or neural foraminal stenosis. Upper chest: No pneumothorax, pulmonary nodule or pleural effusion. Other: Calcific aortic atherosclerosis. IMPRESSION: 1. Chronic ischemic microangiopathy and generalized atrophy without acute intracranial abnormality. 2. No acute fracture or static subluxation of the cervical spine. Aortic Atherosclerosis (ICD10-I70.0). Electronically Signed   By: Ulyses Jarred M.D.   On: 09/29/2020 19:23   CT Hip Left Wo Contrast  Result Date: 09/29/2020 CLINICAL DATA:  Left hip pain after a fall. Possible greater trochanter fracture on prior plain films Initial encounter. EXAM: CT OF THE LEFT HIP WITHOUT CONTRAST TECHNIQUE: Multidetector CT imaging of the left hip was performed according to the standard protocol. Multiplanar CT image reconstructions were also generated. COMPARISON:  Plain films left hip today. FINDINGS: Bones/Joint/Cartilage The patient has an acute, nondisplaced fracture of the posterior aspect of the greater trochanter no intertrochanteric extension is identified. No other fracture is seen. The left hip is located. No lytic or sclerotic lesion. Mild left hip degenerative change noted. Ligaments Suboptimally assessed by CT. Muscles and Tendons Negative. Soft tissues Negative. IMPRESSION: Acute nondisplaced fracture of the greater trochanter as described. Electronically Signed   By: Inge Rise M.D.   On: 09/29/2020 21:34   DG Hip Unilat With Pelvis 2-3 Views Left  Result Date: 09/29/2020 CLINICAL DATA:  Fall EXAM: DG HIP (WITH OR WITHOUT PELVIS) 2-3V LEFT COMPARISON:  CT 03/04/2019 FINDINGS: SI joints are non widened. Pubic symphysis and rami are intact. There is questionable lucency at the greater trochanter of left femur on one view  IMPRESSION: Questionable lucency at the greater trochanter of left femur. Suggest CT for further evaluation Electronically Signed   By: Donavan Foil M.D.   On: 09/29/2020 20:12    EKG: Not yet performed  Assessment/Plan Principal Problem:   Closed intertrochanteric fracture of left femur [facility]) Active Problems:   Iron deficiency anemia   Hypothyroidism   Dementia, old age, without behavioral disturbance ([place])   Hypertension  Fall Closed intratrochanteric fracture of the left femur > Patient presenting after a fall that was unwitnessed at her facility.  Found on the floor by staff with  left hip pain. > Imaging in the ED confirmed left intratrochanteric hip fracture. > Orthopedics consulted in the ED and plan for surgery in the morning after review of images.  Received tranexamic acid in the ED. > Daughter reports shoulder pain is chronic - Appreciate orthopedics recommendations - Monitor on telemetry - Pain control as needed (Dilaudid) - Continue calcium and vitamin D  - Affected extremity care per protocol - A.m. labs with type and screen  Iron deficient anemia - Continue home iron supplementation  Hypertension - Continue home amlodipine  Hypothyroidism - Continue home thyroid tablets 75mg  daily  Dementia > Waxing and waning orientation. > Of note, daughter states that patient does have a "imaginary friend ", so it would not be surprising to find that she is talking to someone who is not there, I think daughter said the friend's name is Gwenlyn Perking but I am not sure.  This is typical behavior for her. - Continue donepezil 5 mg nightly - Continue sertraline nightly  DVT prophylaxis: SCDs Code Status:   DNR, Confirmed with daughter by phone Family Communication:  Daughter updated by phone  Disposition Plan:   [facility] from:  [facility]  Anticipated DC to:  Same as above provided they have appropriate rehab  Anticipated DC date:  1 to 3 days  Anticipated DC  barriers: None  Consults called:  Orthopedics, Dr. Posey Pronto, consulted by EDP.   Admission status:  Observation, MedSurg with telemetry   Severity of Illness: The appropriate patient status for this patient is OBSERVATION. Observation status is judged to be reasonable and necessary in order to provide the required intensity of service to ensure the patient's safety. The patient's presenting symptoms, physical exam findings, and initial radiographic and laboratory data in the context of their medical condition is felt to place them at decreased risk for further clinical deterioration. Furthermore, it is anticipated that the patient will be medically stable for discharge from the hospital within 2 midnights of admission. The following factors support the patient status of observation.   " The patient's presenting symptoms include fall, hip pain. " The physical exam findings include hip pain, and shoulder pain. " The initial radiographic and laboratory data are  Lab work-up showed BMP with sodium 133.  CBC showed leukocytosis to 11.3.  PT and INR pending.  Respiratory panel for flu and COVID-negative.  Urinalysis WNL.  Imaging showed chest x-ray with coarse chronic opacity and small right pleural effusion.  Left hip x-ray with questionable lucency at the left femur.  Left hip CT with nondisplaced fractureof the greater trochanter.  Left hip MRI ordered and has not been resulted but reportedly confirmed fracture.  CT head and CT C-spine were without acute abnormality.   Marcelyn Bruins MD [facility]  How to contact the [facility] Attending or Consulting provider [place] or covering provider during after hours [place], for this patient?   Check the care team in [facility] and look for a) attending/consulting [facility] provider listed and b) the [facility] team listed Log into [email or web address] and use [place]'s universal password to access. If you do not have the password, please contact the hospital operator. Locate the  [facility] provider you are looking for under [facility] and page to a number that you can be directly reached. If you still have difficulty reaching the provider, please page the [facility] (Director on Call) for the Hospitalists listed on amion for assistance.  09/30/2020, 12:12 AM

## 2020-09-30 ENCOUNTER — Observation Stay: Payer: Medicare Other | Admitting: Certified Registered Nurse Anesthetist

## 2020-09-30 ENCOUNTER — Encounter
Admission: EM | Disposition: A | Discharge status: Skilled Nursing Facility | Payer: Self-pay | Source: Skilled Nursing Facility | Attending: Internal Medicine

## 2020-09-30 ENCOUNTER — Observation Stay: Payer: Medicare Other

## 2020-09-30 ENCOUNTER — Encounter: Payer: Self-pay | Admitting: Internal Medicine

## 2020-09-30 DIAGNOSIS — Z888 Allergy status to other drugs, medicaments and biological substances status: Secondary | ICD-10-CM | POA: Diagnosis not present

## 2020-09-30 DIAGNOSIS — E871 Hypo-osmolality and hyponatremia: Secondary | ICD-10-CM | POA: Diagnosis present

## 2020-09-30 DIAGNOSIS — Z993 Dependence on wheelchair: Secondary | ICD-10-CM | POA: Diagnosis not present

## 2020-09-30 DIAGNOSIS — F0390 Unspecified dementia without behavioral disturbance: Secondary | ICD-10-CM | POA: Diagnosis present

## 2020-09-30 DIAGNOSIS — Z9049 Acquired absence of other specified parts of digestive tract: Secondary | ICD-10-CM | POA: Diagnosis not present

## 2020-09-30 DIAGNOSIS — S72002A Fracture of unspecified part of neck of left femur, initial encounter for closed fracture: Secondary | ICD-10-CM | POA: Diagnosis present

## 2020-09-30 DIAGNOSIS — I10 Essential (primary) hypertension: Secondary | ICD-10-CM

## 2020-09-30 DIAGNOSIS — Z85038 Personal history of other malignant neoplasm of large intestine: Secondary | ICD-10-CM | POA: Diagnosis not present

## 2020-09-30 DIAGNOSIS — D62 Acute posthemorrhagic anemia: Secondary | ICD-10-CM | POA: Diagnosis not present

## 2020-09-30 DIAGNOSIS — Z91048 Other nonmedicinal substance allergy status: Secondary | ICD-10-CM | POA: Diagnosis not present

## 2020-09-30 DIAGNOSIS — Z79899 Other long term (current) drug therapy: Secondary | ICD-10-CM | POA: Diagnosis not present

## 2020-09-30 DIAGNOSIS — Z87891 Personal history of nicotine dependence: Secondary | ICD-10-CM | POA: Diagnosis not present

## 2020-09-30 DIAGNOSIS — M25552 Pain in left hip: Secondary | ICD-10-CM | POA: Diagnosis present

## 2020-09-30 DIAGNOSIS — W19XXXA Unspecified fall, initial encounter: Secondary | ICD-10-CM | POA: Diagnosis present

## 2020-09-30 DIAGNOSIS — S72145S Nondisplaced intertrochanteric fracture of left femur, sequela: Secondary | ICD-10-CM | POA: Diagnosis not present

## 2020-09-30 DIAGNOSIS — Z66 Do not resuscitate: Secondary | ICD-10-CM | POA: Diagnosis present

## 2020-09-30 DIAGNOSIS — R710 Precipitous drop in hematocrit: Secondary | ICD-10-CM | POA: Diagnosis not present

## 2020-09-30 DIAGNOSIS — E039 Hypothyroidism, unspecified: Secondary | ICD-10-CM | POA: Diagnosis present

## 2020-09-30 DIAGNOSIS — Z20822 Contact with and (suspected) exposure to covid-19: Secondary | ICD-10-CM | POA: Diagnosis present

## 2020-09-30 DIAGNOSIS — H3530 Unspecified macular degeneration: Secondary | ICD-10-CM | POA: Diagnosis present

## 2020-09-30 DIAGNOSIS — Z91040 Latex allergy status: Secondary | ICD-10-CM | POA: Diagnosis not present

## 2020-09-30 DIAGNOSIS — S72145A Nondisplaced intertrochanteric fracture of left femur, initial encounter for closed fracture: Secondary | ICD-10-CM | POA: Diagnosis present

## 2020-09-30 DIAGNOSIS — D509 Iron deficiency anemia, unspecified: Secondary | ICD-10-CM | POA: Diagnosis present

## 2020-09-30 DIAGNOSIS — F32A Depression, unspecified: Secondary | ICD-10-CM | POA: Diagnosis present

## 2020-09-30 HISTORY — PX: INTRAMEDULLARY (IM) NAIL INTERTROCHANTERIC: SHX5875

## 2020-09-30 LAB — CBC
HCT: 35 % — ABNORMAL LOW (ref 36.0–46.0)
Hemoglobin: 11.6 g/dL — ABNORMAL LOW (ref 12.0–15.0)
MCH: 29.3 pg (ref 26.0–34.0)
MCHC: 33.1 g/dL (ref 30.0–36.0)
MCV: 88.4 fL (ref 80.0–100.0)
Platelets: 194 10*3/uL (ref 150–400)
RBC: 3.96 MIL/uL (ref 3.87–5.11)
RDW: 13.4 % (ref 11.5–15.5)
WBC: 10.2 10*3/uL (ref 4.0–10.5)
nRBC: 0 % (ref 0.0–0.2)

## 2020-09-30 LAB — BASIC METABOLIC PANEL
Anion gap: 7 (ref 5–15)
BUN: 11 mg/dL (ref 8–23)
CO2: 29 mmol/L (ref 22–32)
Calcium: 8.8 mg/dL — ABNORMAL LOW (ref 8.9–10.3)
Chloride: 98 mmol/L (ref 98–111)
Creatinine, Ser: 0.7 mg/dL (ref 0.44–1.00)
GFR, Estimated: >60 mL/min (ref >60)
Glucose, Bld: 104 mg/dL — ABNORMAL HIGH (ref 70–99)
Potassium: 4.5 mmol/L (ref 3.5–5.1)
Sodium: 134 mmol/L — ABNORMAL LOW (ref 135–145)

## 2020-09-30 LAB — TYPE AND SCREEN
ABO/RH(D): O POS
Antibody Screen: NEGATIVE

## 2020-09-30 SURGERY — FIXATION, FRACTURE, INTERTROCHANTERIC, WITH INTRAMEDULLARY ROD
Anesthesia: Spinal | Site: Hip | Laterality: Left

## 2020-09-30 MED ORDER — FENTANYL CITRATE (PF) 100 MCG/2ML IJ SOLN
INTRAMUSCULAR | Status: DC | PRN
  Administered 2020-09-30 (×2): 25 ug via INTRAVENOUS

## 2020-09-30 MED ORDER — MAGNESIUM OXIDE -MG SUPPLEMENT 400 (240 MG) MG PO TABS
400.0000 mg | ORAL_TABLET | Freq: Two times a day (BID) | ORAL | Status: DC
  Administered 2020-09-30: 400 mg via ORAL
  Filled 2020-09-30: qty 1

## 2020-09-30 MED ORDER — PHENYLEPHRINE HCL (PRESSORS) 10 MG/ML IV SOLN
INTRAVENOUS | Status: DC | PRN
  Administered 2020-09-30 (×5): 100 ug via INTRAVENOUS

## 2020-09-30 MED ORDER — LACTATED RINGERS IV SOLN: INTRAVENOUS | Status: DC | PRN

## 2020-09-30 MED ORDER — METOCLOPRAMIDE HCL 10 MG PO TABS: 5.0000 mg | ORAL_TABLET | Freq: Three times a day (TID) | ORAL | Status: DC | PRN

## 2020-09-30 MED ORDER — ONDANSETRON HCL 4 MG/2ML IJ SOLN
INTRAMUSCULAR | Status: AC
  Filled 2020-09-30: qty 2

## 2020-09-30 MED ORDER — THYROID 60 MG PO TABS
75.0000 mg | ORAL_TABLET | Freq: Every day | ORAL | Status: DC
  Administered 2020-09-30 – 2020-10-04 (×6): 75 mg via ORAL
  Filled 2020-09-30 (×5): qty 1

## 2020-09-30 MED ORDER — PHENYLEPHRINE HCL (PRESSORS) 10 MG/ML IV SOLN
INTRAVENOUS | Status: AC
  Filled 2020-09-30: qty 1

## 2020-09-30 MED ORDER — CEFAZOLIN SODIUM-DEXTROSE 2-4 GM/100ML-% IV SOLN
INTRAVENOUS | Status: AC
  Filled 2020-09-30: qty 100

## 2020-09-30 MED ORDER — PROPOFOL 500 MG/50ML IV EMUL
INTRAVENOUS | Status: AC
  Filled 2020-09-30: qty 50

## 2020-09-30 MED ORDER — SODIUM CHLORIDE 0.9 % IR SOLN
Status: DC | PRN
  Administered 2020-09-30: 1000 mL

## 2020-09-30 MED ORDER — OXYCODONE HCL 5 MG PO TABS
5.0000 mg | ORAL_TABLET | ORAL | Status: DC | PRN
  Administered 2020-10-02: 5 mg via ORAL
  Filled 2020-09-30: qty 1

## 2020-09-30 MED ORDER — HYDROMORPHONE HCL 1 MG/ML IJ SOLN: 0.2000 mg | INTRAMUSCULAR | Status: DC | PRN

## 2020-09-30 MED ORDER — FENTANYL CITRATE (PF) 100 MCG/2ML IJ SOLN
INTRAMUSCULAR | Status: AC
  Administered 2020-09-30: 50 ug via INTRAVENOUS
  Filled 2020-09-30: qty 2

## 2020-09-30 MED ORDER — CALCIUM CARBONATE ANTACID 500 MG PO CHEW
400.0000 mg | CHEWABLE_TABLET | Freq: Two times a day (BID) | ORAL | Status: DC
  Administered 2020-09-30 – 2020-10-04 (×9): 400 mg via ORAL
  Filled 2020-09-30 (×9): qty 2

## 2020-09-30 MED ORDER — FLEET ENEMA 7-19 GM/118ML RE ENEM: 1.0000 | ENEMA | Freq: Once | RECTAL | Status: DC | PRN

## 2020-09-30 MED ORDER — ENOXAPARIN SODIUM 40 MG/0.4ML IJ SOSY
40.0000 mg | PREFILLED_SYRINGE | INTRAMUSCULAR | Status: DC
  Administered 2020-10-01 – 2020-10-04 (×4): 40 mg via SUBCUTANEOUS
  Filled 2020-09-30 (×5): qty 0.4

## 2020-09-30 MED ORDER — CEFAZOLIN SODIUM-DEXTROSE 2-4 GM/100ML-% IV SOLN
2.0000 g | Freq: Once | INTRAVENOUS | Status: AC
  Administered 2020-09-30: 2 g via INTRAVENOUS
  Filled 2020-09-30: qty 100

## 2020-09-30 MED ORDER — METOCLOPRAMIDE HCL 5 MG/ML IJ SOLN: 5.0000 mg | Freq: Three times a day (TID) | INTRAMUSCULAR | Status: DC | PRN

## 2020-09-30 MED ORDER — 0.9 % SODIUM CHLORIDE (POUR BTL) OPTIME
TOPICAL | Status: DC | PRN
  Administered 2020-09-30: 1000 mL

## 2020-09-30 MED ORDER — KETOROLAC TROMETHAMINE 15 MG/ML IJ SOLN
7.5000 mg | Freq: Four times a day (QID) | INTRAMUSCULAR | Status: AC
  Administered 2020-09-30 – 2020-10-01 (×4): 7.5 mg via INTRAVENOUS
  Filled 2020-09-30 (×4): qty 1

## 2020-09-30 MED ORDER — BISACODYL 10 MG RE SUPP: 10.0000 mg | Freq: Every day | RECTAL | Status: DC | PRN

## 2020-09-30 MED ORDER — BUPIVACAINE HCL (PF) 0.5 % IJ SOLN
INTRAMUSCULAR | Status: AC
  Filled 2020-09-30: qty 30

## 2020-09-30 MED ORDER — ONDANSETRON HCL 4 MG/2ML IJ SOLN
4.0000 mg | Freq: Once | INTRAMUSCULAR | Status: AC | PRN
  Administered 2020-09-30: 4 mg via INTRAVENOUS

## 2020-09-30 MED ORDER — BUPIVACAINE HCL (PF) 0.5 % IJ SOLN
INTRAMUSCULAR | Status: DC | PRN
  Administered 2020-09-30: 30 mL

## 2020-09-30 MED ORDER — BUPIVACAINE LIPOSOME 1.3 % IJ SUSP
INTRAMUSCULAR | Status: DC | PRN
  Administered 2020-09-30: 20 mL

## 2020-09-30 MED ORDER — ONDANSETRON HCL 4 MG/2ML IJ SOLN: 4.0000 mg | Freq: Four times a day (QID) | INTRAMUSCULAR | Status: DC | PRN

## 2020-09-30 MED ORDER — SERTRALINE HCL 50 MG PO TABS: 150.0000 mg | ORAL_TABLET | Freq: Every day | ORAL | Status: DC

## 2020-09-30 MED ORDER — DOCUSATE SODIUM 100 MG PO CAPS
100.0000 mg | ORAL_CAPSULE | Freq: Two times a day (BID) | ORAL | Status: DC
  Administered 2020-09-30 – 2020-10-04 (×8): 100 mg via ORAL
  Filled 2020-09-30 (×8): qty 1

## 2020-09-30 MED ORDER — PROPOFOL 500 MG/50ML IV EMUL
INTRAVENOUS | Status: DC | PRN
  Administered 2020-09-30: 75 ug/kg/min via INTRAVENOUS

## 2020-09-30 MED ORDER — BUPIVACAINE HCL (PF) 0.5 % IJ SOLN
INTRAMUSCULAR | Status: DC | PRN
  Administered 2020-09-30: 3 mL

## 2020-09-30 MED ORDER — ACETAMINOPHEN 10 MG/ML IV SOLN
INTRAVENOUS | Status: AC
  Filled 2020-09-30: qty 100

## 2020-09-30 MED ORDER — CEFAZOLIN SODIUM-DEXTROSE 2-4 GM/100ML-% IV SOLN
2.0000 g | Freq: Four times a day (QID) | INTRAVENOUS | Status: AC
  Administered 2020-10-01 (×2): 2 g via INTRAVENOUS
  Filled 2020-09-30 (×2): qty 100

## 2020-09-30 MED ORDER — ONDANSETRON HCL 4 MG/2ML IJ SOLN
INTRAMUSCULAR | Status: DC | PRN
  Administered 2020-09-30: 4 mg via INTRAVENOUS

## 2020-09-30 MED ORDER — SODIUM CHLORIDE 0.9 % IV SOLN: INTRAVENOUS | Status: DC

## 2020-09-30 MED ORDER — OXYCODONE HCL 5 MG PO TABS
2.5000 mg | ORAL_TABLET | ORAL | Status: DC | PRN
  Administered 2020-10-02 – 2020-10-03 (×3): 5 mg via ORAL
  Filled 2020-09-30 (×3): qty 1

## 2020-09-30 MED ORDER — MEPERIDINE HCL 25 MG/ML IJ SOLN: 6.2500 mg | INTRAMUSCULAR | Status: DC | PRN

## 2020-09-30 MED ORDER — DONEPEZIL HCL 5 MG PO TABS
5.0000 mg | ORAL_TABLET | Freq: Every day | ORAL | Status: DC
  Administered 2020-09-30 – 2020-10-03 (×5): 5 mg via ORAL
  Filled 2020-09-30 (×5): qty 1

## 2020-09-30 MED ORDER — TRAMADOL HCL 50 MG PO TABS: 50.0000 mg | ORAL_TABLET | Freq: Four times a day (QID) | ORAL | Status: DC | PRN

## 2020-09-30 MED ORDER — METHOCARBAMOL 500 MG PO TABS
500.0000 mg | ORAL_TABLET | Freq: Four times a day (QID) | ORAL | Status: DC | PRN
  Administered 2020-10-02 – 2020-10-03 (×2): 500 mg via ORAL
  Filled 2020-09-30 (×3): qty 1

## 2020-09-30 MED ORDER — METHOCARBAMOL 1000 MG/10ML IJ SOLN
500.0000 mg | Freq: Four times a day (QID) | INTRAVENOUS | Status: DC | PRN
  Filled 2020-09-30: qty 5

## 2020-09-30 MED ORDER — ACETAMINOPHEN 10 MG/ML IV SOLN
INTRAVENOUS | Status: DC | PRN
  Administered 2020-09-30: 1000 mg via INTRAVENOUS

## 2020-09-30 MED ORDER — NEOMYCIN-POLYMYXIN B GU 40-200000 IR SOLN
Status: AC
  Filled 2020-09-30: qty 4

## 2020-09-30 MED ORDER — BUPIVACAINE LIPOSOME 1.3 % IJ SUSP
INTRAMUSCULAR | Status: AC
  Filled 2020-09-30: qty 20

## 2020-09-30 MED ORDER — PROPOFOL 10 MG/ML IV BOLUS
INTRAVENOUS | Status: DC | PRN
  Administered 2020-09-30: 20 mg via INTRAVENOUS
  Administered 2020-09-30: 10 mg via INTRAVENOUS

## 2020-09-30 MED ORDER — MEMANTINE HCL 5 MG PO TABS
10.0000 mg | ORAL_TABLET | Freq: Two times a day (BID) | ORAL | Status: DC
  Administered 2020-09-30 – 2020-10-04 (×8): 10 mg via ORAL
  Filled 2020-09-30 (×8): qty 2

## 2020-09-30 MED ORDER — ACETAMINOPHEN 500 MG PO TABS
1000.0000 mg | ORAL_TABLET | Freq: Three times a day (TID) | ORAL | Status: DC
  Administered 2020-09-30 – 2020-10-04 (×12): 1000 mg via ORAL
  Filled 2020-09-30 (×11): qty 2

## 2020-09-30 MED ORDER — FENTANYL CITRATE (PF) 100 MCG/2ML IJ SOLN
25.0000 ug | INTRAMUSCULAR | Status: DC | PRN
  Administered 2020-09-30 (×2): 25 ug via INTRAVENOUS

## 2020-09-30 MED ORDER — FENTANYL CITRATE (PF) 100 MCG/2ML IJ SOLN
INTRAMUSCULAR | Status: AC
  Filled 2020-09-30: qty 2

## 2020-09-30 MED ORDER — SODIUM CHLORIDE 0.9 % IV SOLN
INTRAVENOUS | Status: DC | PRN
  Administered 2020-09-30: 30 ug/min via INTRAVENOUS

## 2020-09-30 MED ORDER — SERTRALINE HCL 50 MG PO TABS
150.0000 mg | ORAL_TABLET | Freq: Every day | ORAL | Status: DC
  Administered 2020-09-30 – 2020-10-03 (×5): 150 mg via ORAL
  Filled 2020-09-30 (×5): qty 3

## 2020-09-30 MED ORDER — KETAMINE HCL 50 MG/5ML IJ SOSY
PREFILLED_SYRINGE | INTRAMUSCULAR | Status: AC
  Filled 2020-09-30: qty 5

## 2020-09-30 MED ORDER — ONDANSETRON HCL 4 MG PO TABS: 4.0000 mg | ORAL_TABLET | Freq: Four times a day (QID) | ORAL | Status: DC | PRN

## 2020-09-30 MED ORDER — LACTATED RINGERS IV BOLUS
500.0000 mL | INTRAVENOUS | Status: AC
  Administered 2020-09-30: 500 mL via INTRAVENOUS

## 2020-09-30 MED ORDER — SENNOSIDES-DOCUSATE SODIUM 8.6-50 MG PO TABS: 1.0000 | ORAL_TABLET | Freq: Every evening | ORAL | Status: DC | PRN

## 2020-09-30 SURGICAL SUPPLY — 51 items
"PENCIL ELECTRO HAND CTR " (MISCELLANEOUS) ×1 IMPLANT
BIT DRILL INTERTAN LAG SCREW (BIT) ×1 IMPLANT
BIT DRILL LONG 4.0 (BIT) IMPLANT
BLADE SURG 15 STRL LF DISP TIS (BLADE) ×1 IMPLANT
BLADE SURG 15 STRL SS (BLADE) ×1
CANISTER SUCT 1200ML W/VALVE (MISCELLANEOUS) ×1 IMPLANT
CHLORAPREP W/TINT 26 (MISCELLANEOUS) ×2 IMPLANT
DRAPE 3/4 80X56 (DRAPES) ×2 IMPLANT
DRAPE SURG 17X11 SM STRL (DRAPES) ×4 IMPLANT
DRAPE U-SHAPE 47X51 STRL (DRAPES) ×4 IMPLANT
DRILL BIT LONG 4.0 (BIT) ×2
DRSG OPSITE POSTOP 3X4 (GAUZE/BANDAGES/DRESSINGS) ×3 IMPLANT
DRSG OPSITE POSTOP 4X6 (GAUZE/BANDAGES/DRESSINGS) ×2 IMPLANT
ELECT REM PT RETURN 9FT ADLT (ELECTROSURGICAL) ×2
ELECTRODE REM PT RTRN 9FT ADLT (ELECTROSURGICAL) ×1 IMPLANT
GAUZE 4X4 16PLY ~~LOC~~+RFID DBL (SPONGE) ×1 IMPLANT
GLOVE SRG 8 PF TXTR STRL LF DI (GLOVE) ×1 IMPLANT
GLOVE SURG SYN 7.5  E (GLOVE) ×1
GLOVE SURG SYN 7.5 E (GLOVE) ×1 IMPLANT
GLOVE SURG SYN 7.5 PF PI (GLOVE) ×1 IMPLANT
GLOVE SURG UNDER POLY LF SZ8 (GLOVE) ×1
GOWN STRL REUS W/ TWL LRG LVL3 (GOWN DISPOSABLE) ×1 IMPLANT
GOWN STRL REUS W/ TWL XL LVL3 (GOWN DISPOSABLE) ×1 IMPLANT
GOWN STRL REUS W/TWL LRG LVL3 (GOWN DISPOSABLE) ×1
GOWN STRL REUS W/TWL XL LVL3 (GOWN DISPOSABLE) ×1
GUIDE PIN 3.2X343 (PIN) ×3
GUIDE PIN 3.2X343MM (PIN) ×3
GUIDE ROD 3.0 (MISCELLANEOUS) ×2
KIT PATIENT CARE HANA TABLE (KITS) ×2 IMPLANT
KIT TURNOVER KIT A (KITS) ×2 IMPLANT
MANIFOLD NEPTUNE II (INSTRUMENTS) ×2 IMPLANT
MAT ABSORB  FLUID 56X50 GRAY (MISCELLANEOUS) ×2
MAT ABSORB FLUID 56X50 GRAY (MISCELLANEOUS) ×2 IMPLANT
NAIL INTERTAN 10X18 130D 10S (Nail) ×1 IMPLANT
NDL FILTER BLUNT 18X1 1/2 (NEEDLE) ×1 IMPLANT
NEEDLE FILTER BLUNT 18X 1/2SAF (NEEDLE) ×1
NEEDLE FILTER BLUNT 18X1 1/2 (NEEDLE) ×1 IMPLANT
NEEDLE HYPO 22GX1.5 SAFETY (NEEDLE) ×2 IMPLANT
NS IRRIG 1000ML POUR BTL (IV SOLUTION) ×2 IMPLANT
PACK HIP COMPR (MISCELLANEOUS) ×2 IMPLANT
PENCIL ELECTRO HAND CTR (MISCELLANEOUS) ×2 IMPLANT
PIN GUIDE 3.2X343MM (PIN) IMPLANT
ROD GUIDE 3.0 (MISCELLANEOUS) IMPLANT
SCREW LAG COMPR KIT 90/85 (Screw) ×1 IMPLANT
SCREW TRIGEN LOW PROF 5.0X32.5 (Screw) ×1 IMPLANT
SPONGE T-LAP 18X18 ~~LOC~~+RFID (SPONGE) ×4 IMPLANT
STAPLER SKIN PROX 35W (STAPLE) ×2 IMPLANT
SUT VIC AB 2-0 CT2 27 (SUTURE) ×2 IMPLANT
SYR 10ML LL (SYRINGE) ×2 IMPLANT
SYR 30ML LL (SYRINGE) ×2 IMPLANT
TAPE CLOTH 3X10 WHT NS LF (GAUZE/BANDAGES/DRESSINGS) ×4 IMPLANT

## 2020-09-30 NOTE — Plan of Care (Signed)
  Problem: Education: Goal: Knowledge of General Education information will improve Description Including pain rating scale, medication(s)/side effects and non-pharmacologic comfort measures Outcome: Progressing   

## 2020-09-30 NOTE — Transfer of Care (Signed)
Immediate Anesthesia Transfer of Care Note  Patient: Sonya Carey  Procedure(s) Performed: INTRAMEDULLARY (IM) NAIL INTERTROCHANTRIC (Left: Hip)  Patient Location: PACU  Anesthesia Type:Spinal  Level of Consciousness: awake and alert   Airway & Oxygen Therapy: Patient Spontanous Breathing and Patient connected to face mask oxygen  Post-op Assessment: Report given to RN and Post -op Vital signs reviewed and stable  Post vital signs: Reviewed and stable  Last Vitals:  Vitals Value Taken Time  BP 110/58 09/30/20 1505  Temp    Pulse 78 09/30/20 1509  Resp 17 09/30/20 1509  SpO2 100 % 09/30/20 1509  Vitals shown include unvalidated device data.  Last Pain:  Vitals:   09/30/20 1149  TempSrc: Temporal  PainSc: 4          Complications: No notable events documented.

## 2020-09-30 NOTE — Anesthesia Preprocedure Evaluation (Addendum)
Anesthesia Evaluation  Patient identified by MRN, date of birth, ID band Patient awake    Reviewed: Allergy & Precautions, NPO status , Patient's Chart, lab work & pertinent test results  History of Anesthesia Complications Negative for: history of anesthetic complications  Airway Mallampati: II  TM Distance: >3 FB Neck ROM: Full    Dental  (+) Upper Dentures, Lower Dentures   Pulmonary neg sleep apnea, neg COPD, former smoker,    Pulmonary exam normal        Cardiovascular hypertension, Pt. on medications Normal cardiovascular exam     Neuro/Psych neg Seizures PSYCHIATRIC DISORDERS Depression Dementia    GI/Hepatic Neg liver ROS, neg GERD  ,  Endo/Other  neg diabetesHypothyroidism   Renal/GU negative Renal ROS     Musculoskeletal   Abdominal   Peds  Hematology  (+) anemia ,   Anesthesia Other Findings Dementia iron deficiency anemia Falls colon cancer Depression Hypertension Hypothyroidism macular degeneration of the left eye   Reproductive/Obstetrics                            Anesthesia Physical  Anesthesia Plan  ASA: 3  Anesthesia Plan: Spinal   Post-op Pain Management:    Induction: Intravenous  PONV Risk Score and Plan: 2 and Propofol infusion  Airway Management Planned: Mask  Additional Equipment:   Intra-op Plan:   Post-operative Plan:   Informed Consent: I have reviewed the patients History and Physical, chart, labs and discussed the procedure including the risks, benefits and alternatives for the proposed anesthesia with the patient or authorized representative who has indicated his/her understanding and acceptance.       Plan Discussed with: CRNA, Anesthesiologist and Surgeon  Anesthesia Plan Comments:        Anesthesia Quick Evaluation

## 2020-09-30 NOTE — Consult Note (Signed)
ORTHOPAEDIC CONSULTATION  REQUESTING PHYSICIAN: Wyvonnia Dusky, MD  Chief Complaint:   L hip pain  History of Present Illness: History obtained from review of chart, discussion with medical providers, and discussion with family.  Sonya Carey is a 87 y.o. female with a medical history significant for dementia , History of colon cancer, hypertension, and hypothyroidism who had a fall yesterday at [facility] assisted living facility.  This was an unwitnessed fall.  The patient noted immediate hip pain and inability to bear weight.  The patient is minimally ambulatory with a walker at baseline but is wheelchair-bound most of the day.  Pain is worse with any sort of movement.  X-rays and CT scan in the emergency department showed a greater trochanteric fracture, but given patient's clinical exam, an MRI was obtained.  This showed a nondisplaced intertrochanteric fracture line.  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[facility])    adenocarcinoma of transverse colon   Depression    Fracture, metacarpal 06/2016   5th metacarpal   History of chicken pox    Hypertension    Hypothyroidism    Macular degeneration    left eye   Thyroid disease    Past Surgical History:  Procedure Laterality Date   CARPAL TUNNEL RELEASE  12/09/2010   CHOLECYSTECTOMY  1995   COLON SURGERY  02/25/2010   transverse colon resection   COLONOSCOPY     ESOPHAGOGASTRODUODENOSCOPY  02/25/2010   GANGLION CYST EXCISION Right 07/26/2016   Procedure: REMOVAL GANGLION OF WRIST;  Surgeon: Dereck Leep, MD;  Location: [facility] ORS;  Service: Orthopedics;  Laterality: Right;   GANGLION CYST EXCISION Right 11/10/2016   Procedure: REMOVAL GANGLION OF WRIST;  Surgeon: Dereck Leep, MD;  Location: [facility] ORS;  Service: Orthopedics;  Laterality: Right;   HERNIA REPAIR     MULTIPLE TOOTH EXTRACTIONS  06/2015   for dentures   UMBILICAL HERNIA REPAIR   02/25/2010   Social History   Socioeconomic History   Marital status: Widowed    Spouse name: Not on file   Number of children: Not on file   Years of education: Not on file   Highest education level: Not on file  Occupational History   Not on file  Tobacco Use   Smoking status: Former   Smokeless tobacco: Never  Vaping Use   Vaping Use: Never used  Substance and Sexual Activity   Alcohol use: No   Drug use: No   Sexual activity: Not on file  Other Topics Concern   Not on file  Social History Narrative   Not on file   Social Determinants of Health   Financial Resource Strain: Not on file  Food Insecurity: Not on file  Transportation Needs: Not on file  Physical Activity: Not on file  Stress: Not on file  Social Connections: Not on file   Family History  Problem Relation Age of Onset   Dementia Mother    Allergies  Allergen Reactions   Celebrex [Celecoxib] Other (See Comments)    GI upset   Latex Itching    NEGATIVE LATEX IgE (< 0.10) 11/04/2016   Statins Other (See Comments)    Muscle pain   Adhesive [Tape] Rash   Prior to Admission medications   Medication Sig Start Date End Date Taking? Authorizing Provider  amLODipine (NORVASC) 5 MG tablet Take 5 mg by mouth daily.    [provider]  Calcium-Magnesium 500-250 MG TABS Take 2 tablets by mouth  2 (two) times daily.    [provider]  cetirizine (ZYRTEC) 10 MG tablet Take 10 mg by mouth daily.    [provider]  chlorhexidine (PERIDEX) 0.12 % solution as directed.    [provider]  cholecalciferol (VITAMIN D) 1000 units tablet Take 1,000 Units by mouth daily.    [provider]  docusate sodium (COLACE) 100 MG capsule Take 1 capsule (100 mg total) by mouth 2 (two) times daily as needed for mild constipation. 03/06/19   Lorella Nimrod, MD  donepezil (ARICEPT) 10 MG tablet Take 10 mg by mouth at bedtime.     [provider]  ferrous sulfate 325 (65 FE) MG  tablet Take 1 tablet (325 mg total) by mouth 2 (two) times daily with a meal. 03/06/19   Lorella Nimrod, MD  hydroxypropyl methylcellulose / hypromellose (ISOPTO TEARS / GONIOVISC) 2.5 % ophthalmic solution Place 1 drop into both eyes 2 (two) times daily as needed for dry eyes.    [provider]  lidocaine (LIDODERM) 5 % Place 1 patch onto the skin daily. Remove & Discard patch within 12 hours or as directed by MD 03/07/19   Lorella Nimrod, MD  magnesium hydroxide (MILK OF MAGNESIA) 400 MG/5ML suspension Take 30 mLs by mouth daily as needed for mild constipation. 03/06/19   Lorella Nimrod, MD  Multiple Vitamins-Minerals (MULTIVITAMIN ADULT PO) Take 7 tablets by mouth daily. SHAKLEE "LIFE STRIP" MULTIVITAMIN PACK    [provider]  OVER THE COUNTER MEDICATION Take 1 tablet by mouth daily. [place]    [provider]  OVER THE COUNTER MEDICATION Take 2 tablets by mouth daily. [place]    [provider]  oxyCODONE (OXY IR/ROXICODONE) 5 MG immediate release tablet Take 1 tablet (5 mg total) by mouth every 4 (four) hours as needed for moderate pain. 03/06/19   Lorella Nimrod, MD  sertraline (ZOLOFT) 100 MG tablet Take 100 mg by mouth at bedtime.     [provider]  thyroid 97.5 MG TABS Take 1.0256 tablets (100 mg total) by mouth daily before breakfast. 03/07/19   Lorella Nimrod, MD  triamcinolone cream (KENALOG) 0.1 % Apply 1 application topically daily as needed (skin breakdown).    [provider]   Recent Labs    09/29/20 2237 09/29/20 2253 09/30/20 0543  WBC 11.3*  --  10.2  HGB 12.8  --  11.6*  HCT 37.1  --  35.0*  PLT 224  --  194  K 3.6  --  4.5  CL 100  --  98  CO2 25  --  29  BUN 11  --  11  CREATININE 0.53  --  0.70  GLUCOSE 92  --  104*  CALCIUM 8.9  --  8.8*  INR  --  1.1  --    DG Chest 1 View  Result Date: 09/29/2020 CLINICAL DATA:  Unwitnessed fall EXAM: CHEST  1 VIEW COMPARISON:  03/03/2019, FINDINGS:  Small right-sided pleural effusion with hazy atelectasis right base. Diffuse coarse chronic interstitial opacity. Normal cardiac size. Aortic atherosclerosis. No pneumothorax. Scoliosis. Severe degenerative changes of both shoulders. IMPRESSION: Coarse chronic interstitial opacity with small right pleural effusion. Electronically Signed   By: Donavan Foil M.D.   On: 09/29/2020 20:13   CT Head Wo Contrast  Result Date: 09/29/2020 CLINICAL DATA:  Found down.  Fall EXAM: CT HEAD WITHOUT CONTRAST CT CERVICAL SPINE WITHOUT CONTRAST TECHNIQUE: Multidetector CT imaging of the head and cervical  spine was performed following the standard protocol without intravenous contrast. Multiplanar CT image reconstructions of the cervical spine were also generated. COMPARISON:  03/01/2019 FINDINGS: CT HEAD FINDINGS Brain: There is no mass, hemorrhage or extra-axial collection. There is generalized atrophy without lobar predilection. There is hypoattenuation of the periventricular white matter, most commonly indicating chronic ischemic microangiopathy. Vascular: No hyperdense vessel or unexpected vascular calcification. Skull: The visualized skull base, calvarium and extracranial soft tissues are normal. Sinuses/Orbits: No fluid levels or advanced mucosal thickening of the visualized paranasal sinuses. No mastoid or middle ear effusion. The orbits are normal. CT CERVICAL SPINE FINDINGS Alignment: No static subluxation. Facets are aligned. Occipital condyles are normally positioned. Skull base and vertebrae: No acute fracture. Soft tissues and spinal canal: No prevertebral fluid or swelling. No visible canal hematoma. Disc levels: No advanced spinal canal or neural foraminal stenosis. Upper chest: No pneumothorax, pulmonary nodule or pleural effusion. Other: Calcific aortic atherosclerosis. IMPRESSION: 1. Chronic ischemic microangiopathy and generalized atrophy without acute intracranial abnormality. 2. No acute fracture or static  subluxation of the cervical spine. Aortic Atherosclerosis (ICD10-I70.0). Electronically Signed   By: Ulyses Jarred M.D.   On: 09/29/2020 19:23   CT Cervical Spine Wo Contrast  Result Date: 09/29/2020 CLINICAL DATA:  Found down.  Fall EXAM: CT HEAD WITHOUT CONTRAST CT CERVICAL SPINE WITHOUT CONTRAST TECHNIQUE: Multidetector CT imaging of the head and cervical spine was performed following the standard protocol without intravenous contrast. Multiplanar CT image reconstructions of the cervical spine were also generated. COMPARISON:  03/01/2019 FINDINGS: CT HEAD FINDINGS Brain: There is no mass, hemorrhage or extra-axial collection. There is generalized atrophy without lobar predilection. There is hypoattenuation of the periventricular white matter, most commonly indicating chronic ischemic microangiopathy. Vascular: No hyperdense vessel or unexpected vascular calcification. Skull: The visualized skull base, calvarium and extracranial soft tissues are normal. Sinuses/Orbits: No fluid levels or advanced mucosal thickening of the visualized paranasal sinuses. No mastoid or middle ear effusion. The orbits are normal. CT CERVICAL SPINE FINDINGS Alignment: No static subluxation. Facets are aligned. Occipital condyles are normally positioned. Skull base and vertebrae: No acute fracture. Soft tissues and spinal canal: No prevertebral fluid or swelling. No visible canal hematoma. Disc levels: No advanced spinal canal or neural foraminal stenosis. Upper chest: No pneumothorax, pulmonary nodule or pleural effusion. Other: Calcific aortic atherosclerosis. IMPRESSION: 1. Chronic ischemic microangiopathy and generalized atrophy without acute intracranial abnormality. 2. No acute fracture or static subluxation of the cervical spine. Aortic Atherosclerosis (ICD10-I70.0). Electronically Signed   By: Ulyses Jarred M.D.   On: 09/29/2020 19:23   CT Hip Left Wo Contrast  Result Date: 09/29/2020 CLINICAL DATA:  Left hip pain after a  fall. Possible greater trochanter fracture on prior plain films Initial encounter. EXAM: CT OF THE LEFT HIP WITHOUT CONTRAST TECHNIQUE: Multidetector CT imaging of the left hip was performed according to the standard protocol. Multiplanar CT image reconstructions were also generated. COMPARISON:  Plain films left hip today. FINDINGS: Bones/Joint/Cartilage The patient has an acute, nondisplaced fracture of the posterior aspect of the greater trochanter no intertrochanteric extension is identified. No other fracture is seen. The left hip is located. No lytic or sclerotic lesion. Mild left hip degenerative change noted. Ligaments Suboptimally assessed by CT. Muscles and Tendons Negative. Soft tissues Negative. IMPRESSION: Acute nondisplaced fracture of the greater trochanter as described. Electronically Signed   By: Inge Rise M.D.   On: 09/29/2020 21:34   MR HIP LEFT WO CONTRAST  Result Date: 09/30/2020 CLINICAL DATA:  Left hip pain, unwitnessed fall. EXAM: MR OF THE LEFT HIP WITHOUT CONTRAST TECHNIQUE: Multiplanar, multisequence MR imaging was performed. No intravenous contrast was administered. COMPARISON:  Multiple exams, including CT scan from 09/29/2020 FINDINGS: Bones: Intertrochanteric fracture of the left hip noted extending from the greater trochanter to the upper margin of the lesser trochanter. This is shown for example on image 18 series 6. Speckled edema along the posterosuperior acetabulum bilaterally favoring mild stress reaction. Linear bands of low T1 and high T2 signal in the sacral ala, for example on the right on image 5 series 7 and on the left on image 13 series 7, favoring stress reaction or early stress fracture. Substantial degenerative endplate findings and spondylosis in the lower lumbar spine. Articular cartilage and labrum Articular cartilage: Moderate to prominent degenerative chondral thinning bilaterally. Labrum:  Poorly assessed due to motion artifact. Joint or bursal  effusion Joint effusion:  Upper normal amount of fluid in the left hip joint. Bursae: Fluid along the left trochanteric bursa likely attributable to the fracture. Muscles and tendons Muscles and tendons: Considerable edema in the soft tissue surrounding the fracture especially the quadratus femoris and vastus lateralis. Other findings Miscellaneous:   No supplemental non-categorized findings. IMPRESSION: 1. Acute left intertrochanteric fracture with surrounding edema in the soft tissues. 2. Linear areas of high T2 and low T1 signal in the sacral ala suggesting stress fractures or stress reaction. Speckled edema signal in the posterosuperior acetabulum bilaterally likewise suggesting stress reaction. 3. Lower lumbar spondylosis and degenerative disc disease with substantial degenerative endplate findings. 4. Moderate to prominent degenerative chondral thinning in both hips. Electronically Signed   By: Van Clines M.D.   On: 09/30/2020 08:04   DG Hip Unilat With Pelvis 2-3 Views Left  Result Date: 09/29/2020 CLINICAL DATA:  Fall EXAM: DG HIP (WITH OR WITHOUT PELVIS) 2-3V LEFT COMPARISON:  CT 03/04/2019 FINDINGS: SI joints are non widened. Pubic symphysis and rami are intact. There is questionable lucency at the greater trochanter of left femur on one view IMPRESSION: Questionable lucency at the greater trochanter of left femur. Suggest CT for further evaluation Electronically Signed   By: Donavan Foil M.D.   On: 09/29/2020 20:12     Positive ROS: All other systems have been reviewed and were otherwise negative with the exception of those mentioned in the HPI and as above.  Physical Exam: BP 139/64   Pulse 86   Temp 98.9 F (37.2 C) (Temporal)   Resp 16   SpO2 96%  General:  Alert, no acute distress Psychiatric:  Patient is NOT competent for consent, non-agitated Cardiovascular:  No pedal edema, regular rate and rhythm Respiratory:  No wheezing, non-labored breathing GI:  Abdomen is soft and  non-tender Skin:  No lesions in the area of chief complaint, no erythema Neurologic:  Sensation intact distally, CN grossly intact Lymphatic:  No axillary or cervical lymphadenopathy  Orthopedic Exam:  LLE: + DF/PF/EHL SILT grossly over foot Foot wwp +Log roll/axial load   X-rays:  As above: L Nondisplaced intertrochanteric hip fracture  Assessment/Plan: Sonya Carey is a 87 y.o. female with a L nondisplaced intertrochanteric hip fracture  1. I discussed the various treatment options including both surgical and non-surgical management of the fracture with the patient and/or family (medical PoA). We discussed the high risk of perioperative complications due to patient's age, dementia, and other co-morbidities. After discussion of risks, benefits, and alternatives to surgery, the family and/or patient were in agreement to proceed with surgery.  The goals of surgery would be to provide adequate pain relief and allow for mobilization. Plan for surgery is L hip cephalomedullary nailing today, 09/30/2020. 2. NPO until OR 3. Hold anticoagulation in advance of OR      Leim Fabry   09/30/2020 12:52 PM

## 2020-09-30 NOTE — Progress Notes (Signed)
PROGRESS NOTE   HPI was taken from Dr. Trilby Drummer: Sonya Carey is a 87 y.o. female with medical history significant of dementia, iron deficiency anemia, falls, colon cancer, depression, hypertension, hypothyroidism, macular degeneration of the left eye who presents after an unwitnessed fall at her facility. History obtained with assistance of chart review.  As above patient had an unwitnessed fall earlier today at her facility.  She is found on the floor by staff.  Patient states that she tripped while heading to the bathroom.  She subsequently noted left hip pain.  Staff helped her to wheelchair and then contacted EMS.  Patient is at her baseline mentation with her dementia.  She waxes and wanes some.  She has denied any head trauma nor loss of consciousness.  She is not taking any blood thinners.             In addition to soreness in her L hip, she also reports aching in her right leg and bilateral arms and shoulders. Daughter reports shoulder pain is chronic and she has some pain from prior rib fractures. She denies fevers, chills, chest pain, shortness of breath, abdominal pain, constipation, diarrhea, nausea, vomiting.   ED Course: Vital signs in the ED are stable significant for blood pressure 419 systolic.  Lab work-up showed BMP with sodium 133.  CBC showed leukocytosis to 11.3.  PT and INR pending.  Respiratory panel for flu and COVID-negative.  Urinalysis WNL.  Imaging showed chest x-ray with coarse chronic opacity and small right pleural effusion.  Left hip x-ray with questionable lucency at the left femur.  Left hip CT with nondisplaced fractureof the greater trochanter.  Left hip MRI ordered and has not been resulted but reportedly confirmed fracture.  CT head and CT C-spine were without acute abnormality.  Patient received dose of fentanyl, Zofran and transischemic acid.  Orthopedics was consulted and requested the MRI and the dose of transischemic acid.  They will be following and plan  for possible surgery in the morning.   Sonya Carey  FXT:[id number] DOB: 31-Dec-1924 DOA: 09/29/2020 PCP: Tracie Harrier, MD   Assessment & Plan:   Principal Problem:   Closed intertrochanteric fracture of left femur ([place]) Active Problems:   Iron deficiency anemia   Hypothyroidism   Dementia, old age, without behavioral disturbance ([place])   Hypertension  Closed intratrochanteric fracture of the left femur: secondary to fall at her facility. Dilaudid prn for pain.Will go for surg today. Ortho surg following and recs apprec   IDA: will restart home dose of iron supplement tomorrow  HTN: continue on home dose of amlodipine   Hypothyroidism: continue on home dose of armour   Dementia: continue on home dose of donepezil. As per admitting physician, pt has imaginary friend as per pt's daughter    DVT prophylaxis: SCDs (going for surg today) Code Status: DNR Family Communication:  Disposition Plan: likely d/c to SNF   Level of care: Med-Surg  Status is: Observation  The patient will require care spanning > 2 midnights and should be moved to inpatient because: Unsafe d/c plan, IV treatments appropriate due to intensity of illness or inability to take PO, and Inpatient level of care appropriate due to severity of illness  Dispo: The patient is from: Home              Anticipated d/c is to: SNF              Patient currently is not medically stable to d/c.  Difficult to place patient : unclear    Consultants:  Ortho surg   Procedures  Antimicrobials:    Subjective: Pt c/o left hip pain   Objective: Vitals:   09/29/20 1741 09/30/20 0012 09/30/20 0150 09/30/20 0410  BP: (!) 152/72 139/73 (!) 152/66 (!) 120/59  Pulse: 77 86 90 92  Resp: 18 18 19 19   Temp: 98.7 F (37.1 C)  98.4 F (36.9 C) 98 F (36.7 C)  TempSrc:    Oral  SpO2: 100% 97% 91% 97%    Intake/Output Summary (Last 24 hours) at 09/30/2020 0727 Last data filed at 09/30/2020 0254 Gross per 24 hour   Intake --  Output 250 ml  Net -250 ml   There were no vitals filed for this visit.  Examination:  General exam: Appears calm and comfortable. Frail appearing  Respiratory system: Clear to auscultation. Respiratory effort normal. Cardiovascular system: S1 & S2 +. No , rubs, gallops or clicks. Gastrointestinal system: Abdomen is nondistended, soft and nontender. Normal bowel sounds heard. Central nervous system: Alert and oriented. Moves all extremities  Psychiatry: Judgement and insight appear normal.Flat mood and affect    Data Reviewed: I have personally reviewed following labs and imaging studies  CBC: Recent Labs  Lab 09/29/20 2237 09/30/20 0543  WBC 11.3* 10.2  NEUTROABS 9.3*  --   HGB 12.8 11.6*  HCT 37.1 35.0*  MCV 85.7 88.4  PLT 224 010   Basic Metabolic Panel: Recent Labs  Lab 09/29/20 2237 09/30/20 0543  NA 133* 134*  K 3.6 4.5  CL 100 98  CO2 25 29  GLUCOSE 92 104*  BUN 11 11  CREATININE 0.53 0.70  CALCIUM 8.9 8.8*   GFR: CrCl cannot be calculated (Unknown ideal weight.). Liver Function Tests: No results for input(s): AST, ALT, ALKPHOS, BILITOT, PROT, ALBUMIN in the last 168 hours. No results for input(s): LIPASE, AMYLASE in the last 168 hours. No results for input(s): AMMONIA in the last 168 hours. Coagulation Profile: Recent Labs  Lab 09/29/20 2253  INR 1.1   Cardiac Enzymes: No results for input(s): CKTOTAL, CKMB, CKMBINDEX, TROPONINI in the last 168 hours. BNP (last 3 results) No results for input(s): PROBNP in the last 8760 hours. HbA1C: No results for input(s): HGBA1C in the last 72 hours. CBG: No results for input(s): GLUCAP in the last 168 hours. Lipid Profile: No results for input(s): CHOL, HDL, LDLCALC, TRIG, CHOLHDL, LDLDIRECT in the last 72 hours. Thyroid Function Tests: No results for input(s): TSH, T4TOTAL, FREET4, T3FREE, THYROIDAB in the last 72 hours. Anemia Panel: No results for input(s): VITAMINB12, FOLATE, FERRITIN,  TIBC, IRON, RETICCTPCT in the last 72 hours. Sepsis Labs: No results for input(s): PROCALCITON, LATICACIDVEN in the last 168 hours.  Recent Results (from the past 240 hour(s))  Resp Panel by RT-PCR (Flu A&B, Covid) Nasopharyngeal Swab     Status: None   Collection Time: 09/29/20 10:37 PM   Specimen: Nasopharyngeal Swab; Nasopharyngeal(NP) swabs in vial transport medium  Result Value Ref Range Status   SARS Coronavirus 2 by RT PCR NEGATIVE NEGATIVE Final    Comment: (NOTE) SARS-CoV-2 target nucleic acids are NOT DETECTED.  The SARS-CoV-2 RNA is generally detectable in upper respiratory specimens during the acute phase of infection. The lowest concentration of SARS-CoV-2 viral copies this assay can detect is 138 copies/mL. A negative result does not preclude SARS-Cov-2 infection and should not be used as the sole basis for treatment or other patient management decisions. A negative result may occur with  improper specimen collection/handling, submission of specimen other than nasopharyngeal swab, presence of viral mutation(s) within the areas targeted by this assay, and inadequate number of viral copies(<138 copies/mL). A negative result must be combined with clinical observations, patient history, and epidemiological information. The expected result is Negative.  Fact Sheet for Patients:  EntrepreneurPulse.com.au  Fact Sheet for Healthcare Providers:  IncredibleEmployment.be  This test is no t yet approved or cleared by the [place] FDA and  has been authorized for detection and/or diagnosis of SARS-CoV-2 by FDA under an Emergency Use Authorization (EUA). This EUA will remain  in effect (meaning this test can be used) for the duration of the COVID-19 declaration under Section 564(b)(1) of the Act, 21 U.S.C.section 360bbb-3(b)(1), unless the authorization is terminated  or revoked sooner.       Influenza A by PCR NEGATIVE NEGATIVE Final    Influenza B by PCR NEGATIVE NEGATIVE Final    Comment: (NOTE) The Xpert Xpress SARS-CoV-2/FLU/RSV plus assay is intended as an aid in the diagnosis of influenza from Nasopharyngeal swab specimens and should not be used as a sole basis for treatment. Nasal washings and aspirates are unacceptable for Xpert Xpress SARS-CoV-2/FLU/RSV testing.  Fact Sheet for Patients: EntrepreneurPulse.com.au  Fact Sheet for Healthcare Providers: IncredibleEmployment.be  This test is not yet approved or cleared by the [place] FDA and has been authorized for detection and/or diagnosis of SARS-CoV-2 by FDA under an Emergency Use Authorization (EUA). This EUA will remain in effect (meaning this test can be used) for the duration of the COVID-19 declaration under Section 564(b)(1) of the Act, 21 U.S.C. section 360bbb-3(b)(1), unless the authorization is terminated or revoked.  Performed at [facility], [address] [place], [place] [address]          Radiology Studies: DG Chest 1 View  Result Date: 09/29/2020 CLINICAL DATA:  Unwitnessed fall EXAM: CHEST  1 VIEW COMPARISON:  03/03/2019, FINDINGS: Small right-sided pleural effusion with hazy atelectasis right base. Diffuse coarse chronic interstitial opacity. Normal cardiac size. Aortic atherosclerosis. No pneumothorax. Scoliosis. Severe degenerative changes of both shoulders. IMPRESSION: Coarse chronic interstitial opacity with small right pleural effusion. Electronically Signed   By: Donavan Foil M.D.   On: 09/29/2020 20:13   CT Head Wo Contrast  Result Date: 09/29/2020 CLINICAL DATA:  Found down.  Fall EXAM: CT HEAD WITHOUT CONTRAST CT CERVICAL SPINE WITHOUT CONTRAST TECHNIQUE: Multidetector CT imaging of the head and cervical spine was performed following the standard protocol without intravenous contrast. Multiplanar CT image reconstructions of the cervical spine were also generated.  COMPARISON:  03/01/2019 FINDINGS: CT HEAD FINDINGS Brain: There is no mass, hemorrhage or extra-axial collection. There is generalized atrophy without lobar predilection. There is hypoattenuation of the periventricular white matter, most commonly indicating chronic ischemic microangiopathy. Vascular: No hyperdense vessel or unexpected vascular calcification. Skull: The visualized skull base, calvarium and extracranial soft tissues are normal. Sinuses/Orbits: No fluid levels or advanced mucosal thickening of the visualized paranasal sinuses. No mastoid or middle ear effusion. The orbits are normal. CT CERVICAL SPINE FINDINGS Alignment: No static subluxation. Facets are aligned. Occipital condyles are normally positioned. Skull base and vertebrae: No acute fracture. Soft tissues and spinal canal: No prevertebral fluid or swelling. No visible canal hematoma. Disc levels: No advanced spinal canal or neural foraminal stenosis. Upper chest: No pneumothorax, pulmonary nodule or pleural effusion. Other: Calcific aortic atherosclerosis. IMPRESSION: 1. Chronic ischemic microangiopathy and generalized atrophy without acute intracranial abnormality. 2. No acute fracture or static subluxation  of the cervical spine. Aortic Atherosclerosis (ICD10-I70.0). Electronically Signed   By: Ulyses Jarred M.D.   On: 09/29/2020 19:23   CT Cervical Spine Wo Contrast  Result Date: 09/29/2020 CLINICAL DATA:  Found down.  Fall EXAM: CT HEAD WITHOUT CONTRAST CT CERVICAL SPINE WITHOUT CONTRAST TECHNIQUE: Multidetector CT imaging of the head and cervical spine was performed following the standard protocol without intravenous contrast. Multiplanar CT image reconstructions of the cervical spine were also generated. COMPARISON:  03/01/2019 FINDINGS: CT HEAD FINDINGS Brain: There is no mass, hemorrhage or extra-axial collection. There is generalized atrophy without lobar predilection. There is hypoattenuation of the periventricular white matter,  most commonly indicating chronic ischemic microangiopathy. Vascular: No hyperdense vessel or unexpected vascular calcification. Skull: The visualized skull base, calvarium and extracranial soft tissues are normal. Sinuses/Orbits: No fluid levels or advanced mucosal thickening of the visualized paranasal sinuses. No mastoid or middle ear effusion. The orbits are normal. CT CERVICAL SPINE FINDINGS Alignment: No static subluxation. Facets are aligned. Occipital condyles are normally positioned. Skull base and vertebrae: No acute fracture. Soft tissues and spinal canal: No prevertebral fluid or swelling. No visible canal hematoma. Disc levels: No advanced spinal canal or neural foraminal stenosis. Upper chest: No pneumothorax, pulmonary nodule or pleural effusion. Other: Calcific aortic atherosclerosis. IMPRESSION: 1. Chronic ischemic microangiopathy and generalized atrophy without acute intracranial abnormality. 2. No acute fracture or static subluxation of the cervical spine. Aortic Atherosclerosis (ICD10-I70.0). Electronically Signed   By: Ulyses Jarred M.D.   On: 09/29/2020 19:23   CT Hip Left Wo Contrast  Result Date: 09/29/2020 CLINICAL DATA:  Left hip pain after a fall. Possible greater trochanter fracture on prior plain films Initial encounter. EXAM: CT OF THE LEFT HIP WITHOUT CONTRAST TECHNIQUE: Multidetector CT imaging of the left hip was performed according to the standard protocol. Multiplanar CT image reconstructions were also generated. COMPARISON:  Plain films left hip today. FINDINGS: Bones/Joint/Cartilage The patient has an acute, nondisplaced fracture of the posterior aspect of the greater trochanter no intertrochanteric extension is identified. No other fracture is seen. The left hip is located. No lytic or sclerotic lesion. Mild left hip degenerative change noted. Ligaments Suboptimally assessed by CT. Muscles and Tendons Negative. Soft tissues Negative. IMPRESSION: Acute nondisplaced fracture of  the greater trochanter as described. Electronically Signed   By: Inge Rise M.D.   On: 09/29/2020 21:34   DG Hip Unilat With Pelvis 2-3 Views Left  Result Date: 09/29/2020 CLINICAL DATA:  Fall EXAM: DG HIP (WITH OR WITHOUT PELVIS) 2-3V LEFT COMPARISON:  CT 03/04/2019 FINDINGS: SI joints are non widened. Pubic symphysis and rami are intact. There is questionable lucency at the greater trochanter of left femur on one view IMPRESSION: Questionable lucency at the greater trochanter of left femur. Suggest CT for further evaluation Electronically Signed   By: Donavan Foil M.D.   On: 09/29/2020 20:12        Scheduled Meds:  amLODipine  5 mg Oral Daily   calcium carbonate  400 mg of elemental calcium Oral BID   cholecalciferol  1,000 Units Oral Daily   donepezil  5 mg Oral QHS   magnesium oxide  400 mg Oral BID   sertraline  150 mg Oral QHS   thyroid  75 mg Oral QAC breakfast   Continuous Infusions:   ceFAZolin (ANCEF) IV     tranexamic acid       LOS: 0 days    Time spent:31 mins    Wyvonnia Dusky, MD  [facility] Pager [phone]  If 7PM-7AM, please contact night-coverage 09/30/2020, 7:27 AM

## 2020-09-30 NOTE — Op Note (Signed)
DATE OF SURGERY: 09/30/2020  PREOPERATIVE DIAGNOSIS: Left intertrochanteric hip fracture  POSTOPERATIVE DIAGNOSIS: Left intertrochanteric hip fracture  PROCEDURE: Intramedullary nailing of L femur with cephalomedullary device  SURGEON: Cato Mulligan, MD  ANESTHESIA: spinal  EBL: 150 cc  IVF: per anesthesia record  COMPONENTS:  [employer] Trigen Intertan Short Nail: 10x130mm; 54mm lag screw with 62mm compression screw; 5x 32.61mm distal cortical interlocking screw  INDICATIONS: Sonya Carey is a 87 y.o. female who sustained a non-displaced intertrochanteric fracture after a fall. Risks and benefits of intramedullary nailing were explained to the patient and/or family. Risks include but are not limited to bleeding, infection, injury to tissues, nerves, vessels, nonunion/malunion, hardware failure, limb length discrepancy/hip rotation mismatch and risks of anesthesia. The patient and/or family understand these risks, have completed an informed consent, and wish to proceed.   PROCEDURE:  The patient was brought into the operating room. After administering anesthesia, the patient was placed in the supine position on the Hana table. The uninjured leg was placed in an extended position while the injured lower extremity was placed in longitudinal traction. The fracture remained nondisplaced. This was verified fluoroscopically in AP and lateral projections. The lateral aspect of the hip and thigh were prepped with ChloraPrep solution before being draped sterilely. Preoperative IV antibiotics were administered. A timeout was performed to verify the appropriate surgical site, patient, and procedure.    The greater trochanter was identified and an approximately 6 cm incision was made about 3 fingerbreadths above the tip of the greater trochanter. This had to be extended proximally ~4cm due to patient size. The incision was carried down through the subcutaneous tissues to expose the gluteal  fascia. This was split the length of the incision, providing access to the tip of the trochanter. Under fluoroscopic guidance, and with assistance of a curved awl, a guidewire was placed through the tip of the trochanter into the proximal metaphysis to the level of the lesser trochanter. After verifying its position fluoroscopically in AP and lateral projections, it was overreamed with the opening reamer to the level of the lesser trochanter. A guidewire was passed down through the femoral canal to the supracondylar region. The nail was selected and advanced to the appropriate depth as verified fluoroscopically.    The guide system for the lag screw was positioned and advanced through an approximately 5cm incision over the lateral aspect of the proximal femur. The guidewire was drilled up through the femoral nail and into the femoral neck to rest within 5 mm of subchondral bone. After verifying its position in the femoral neck and head in both AP and lateral projections, the guidewire was measured and appropriate sized lag screw was selected.  The channel for the compression screw was drilled and antirotation bar was placed.  Lag screw was drilled and placed in appropriate position.  Compression screw was then placed.  No further compression was necessary.  The set screw was locked in place. Again, the adequacy of hardware position and fracture reduction was verified fluoroscopically in AP and lateral projections.   Attention was then turned to the distal interlocking screw in the diaphysis. Using a targeted assembly, a stab incision was made and hole was drilled through the nail. An interlocking screw was placed with excellent purchase.  Appropriate screw position was verified fluoroscopically in AP and lateral projections.   The wounds were irrigated thoroughly with sterile saline solution. Local anesthetic was injected into the wounds. Deep fascia was closed with 0-Vicryl. The subcutaneous  tissues were  closed using 2-0 Vicryl interrupted sutures. The skin was closed using staples. Sterile occlusive dressings were applied to all wounds. The patient was then transferred to the recovery room in satisfactory condition.   POSTOPERATIVE PLAN: The patient will be WBAT on the operative extremity. Lovenox 40mg /day x 4 weeks to start on POD#1. Perioperative IV antibiotics x 24 hours. PT/OT on POD#1.

## 2020-09-30 NOTE — Anesthesia Postprocedure Evaluation (Signed)
Anesthesia Post Note  Patient: Sonya Carey  Procedure(s) Performed: INTRAMEDULLARY (IM) NAIL INTERTROCHANTRIC (Left: Hip)  Patient location during evaluation: PACU Anesthesia Type: Spinal Level of consciousness: awake and alert, awake and oriented Pain management: pain level controlled Vital Signs Assessment: post-procedure vital signs reviewed and stable Respiratory status: spontaneous breathing, nonlabored ventilation and respiratory function stable Cardiovascular status: blood pressure returned to baseline and stable Postop Assessment: no apparent nausea or vomiting Anesthetic complications: no   No notable events documented.   Last Vitals:  Vitals:   09/30/20 1615 09/30/20 1714  BP: (!) 107/54 (!) 115/53  Pulse: 82 79  Resp: 11 18  Temp: 36.7 C 36.6 C  SpO2: 93% 98%    Last Pain:  Vitals:   09/30/20 1604  TempSrc:   PainSc: 9                  Phill Mutter

## 2020-09-30 NOTE — Anesthesia Procedure Notes (Signed)
Spinal  Patient location during procedure: OR Start time: 09/30/2020 1:00 PM End time: 09/30/2020 1:12 PM Reason for block: surgical anesthesia Staffing Anesthesiologist: Phill Mutter, MD Resident/CRNA: Daryel Gerald, RN Preanesthetic Checklist Completed: patient identified, IV checked, site marked, risks and benefits discussed, surgical consent, monitors and equipment checked, pre-op evaluation and timeout performed Spinal Block Patient position: sitting Prep: Betadine Patient monitoring: heart rate, continuous pulse ox, blood pressure and cardiac monitor Approach: midline Location: L3-4 Injection technique: single-shot Needle Needle type: Quincke  Needle gauge: 22 G Needle length: 9 cm Assessment Events: CSF return Additional Notes Negative paresthesia. Negative blood return. Positive free-flowing CSF. Expiration date of kit checked and confirmed. Patient tolerated procedure well, without complications.

## 2020-09-30 NOTE — H&P (Signed)
H&P reviewed. No significant changes noted.  

## 2020-10-01 ENCOUNTER — Encounter: Payer: Self-pay | Admitting: Orthopedic Surgery

## 2020-10-01 DIAGNOSIS — I10 Essential (primary) hypertension: Secondary | ICD-10-CM | POA: Diagnosis not present

## 2020-10-01 DIAGNOSIS — S72145S Nondisplaced intertrochanteric fracture of left femur, sequela: Secondary | ICD-10-CM

## 2020-10-01 DIAGNOSIS — D509 Iron deficiency anemia, unspecified: Secondary | ICD-10-CM | POA: Diagnosis not present

## 2020-10-01 DIAGNOSIS — E039 Hypothyroidism, unspecified: Secondary | ICD-10-CM | POA: Diagnosis not present

## 2020-10-01 LAB — CBC
HCT: 27 % — ABNORMAL LOW (ref 36.0–46.0)
Hemoglobin: 9 g/dL — ABNORMAL LOW (ref 12.0–15.0)
MCH: 29.2 pg (ref 26.0–34.0)
MCHC: 33.3 g/dL (ref 30.0–36.0)
MCV: 87.7 fL (ref 80.0–100.0)
Platelets: 131 10*3/uL — ABNORMAL LOW (ref 150–400)
RBC: 3.08 MIL/uL — ABNORMAL LOW (ref 3.87–5.11)
RDW: 13.7 % (ref 11.5–15.5)
WBC: 7.5 10*3/uL (ref 4.0–10.5)
nRBC: 0 % (ref 0.0–0.2)

## 2020-10-01 LAB — BASIC METABOLIC PANEL
Anion gap: 9 (ref 5–15)
BUN: 13 mg/dL (ref 8–23)
CO2: 25 mmol/L (ref 22–32)
Calcium: 8.3 mg/dL — ABNORMAL LOW (ref 8.9–10.3)
Chloride: 97 mmol/L — ABNORMAL LOW (ref 98–111)
Creatinine, Ser: 0.68 mg/dL (ref 0.44–1.00)
GFR, Estimated: >60 mL/min (ref >60)
Glucose, Bld: 95 mg/dL (ref 70–99)
Potassium: 4 mmol/L (ref 3.5–5.1)
Sodium: 131 mmol/L — ABNORMAL LOW (ref 135–145)

## 2020-10-01 MED ORDER — AMLODIPINE BESYLATE 5 MG PO TABS
2.5000 mg | ORAL_TABLET | Freq: Every day | ORAL | Status: DC
  Filled 2020-10-01 (×2): qty 1

## 2020-10-01 MED ORDER — CHLORHEXIDINE GLUCONATE CLOTH 2 % EX PADS
6.0000 | MEDICATED_PAD | Freq: Every day | CUTANEOUS | Status: DC
  Administered 2020-10-02 – 2020-10-04 (×3): 6 via TOPICAL

## 2020-10-01 MED ORDER — FERROUS SULFATE 325 (65 FE) MG PO TABS
325.0000 mg | ORAL_TABLET | Freq: Every day | ORAL | Status: DC
  Administered 2020-10-02 – 2020-10-04 (×3): 325 mg via ORAL
  Filled 2020-10-01 (×3): qty 1

## 2020-10-01 NOTE — Evaluation (Signed)
Occupational Therapy Evaluation Patient Details Name: Sonya Carey MRN: [id number] DOB: 1924-11-19 Today's Date: 10/01/2020    History of Present Illness Sonya Carey is a 87 y.o. female with a medical history significant for dementia , History of colon cancer, hypertension, and hypothyroidism who had an unwitnessed fall yesterday at [facility] assisted living facility.  Pt was found to have a nondisplaced intertrochanteric fracture of her LLE. She is now s/p IM nailing.   Clinical Impression   [name] was seen for OT-PT Co-evaluation this date, POD#1 from above surgery. Pt has hx of cognitive impairment at baseline. She remains pleasantly confused t/o session. Son present at bedside and provides information regarding PLOF/home set up. Per son, pt has resided in [facility] for last year. Staff generally assist with all BADL tasks. Pt is able to walk short distance with a RW, but otherwise uses a WC for functional mobility. Pt currently requires +2 MOD A for bed mobility as well as +2 MOD-MAX A for SPT. Anticipate MAX A for LB dressing with seated lateral leans, MAX A for toileting with +2 assist for any STS needed for daily care tasks. Pt would benefit from additional instruction in self care skills and techniques to help maintain safety and functional independence with or without assistive devices to support recall and carryover prior to discharge. Recommend pt return to SNF upon discharge.      Follow Up Recommendations  SNF;Supervision/Assistance - 24 hour    Equipment Recommendations  None recommended by OT    Recommendations for Other Services       Precautions / Restrictions Precautions Precautions: Fall Restrictions Weight Bearing Restrictions: Yes LLE Weight Bearing: Weight bearing as tolerated      Mobility Bed Mobility Overal bed mobility: Needs Assistance Bed Mobility: Supine to Sit     Supine to sit: Mod assist;+2 for physical assistance           Transfers Overall transfer level: Needs assistance Equipment used: 2 person hand held assist Transfers: Stand Pivot Transfers   Stand pivot transfers: Max assist;Mod assist;+2 physical assistance            Balance Overall balance assessment: Needs assistance Sitting-balance support: Feet supported;Bilateral upper extremity supported Sitting balance-Leahy Scale: Poor Sitting balance - Comments: Sitting balance initially limited and pt requires at least MOD A to maintain upright position. With cues for hand placement she is able to progress to close SBA briefly while seated EOB.   Standing balance support: During functional activity;Bilateral upper extremity supported Standing balance-Leahy Scale: Zero Standing balance comment: Requires +2 assist for staning balance.                           ADL either performed or assessed with clinical judgement   ADL Overall ADL's : Needs assistance/impaired                                       General ADL Comments: Pt is functionally limited by increased pain in her LLE, decreased activity tolerance, and impaired cognition. She currently requires +2 MOD A for bed mobility as well as +2 MOD-MAX A for SPT. Anticipate MAX A for LB dressing with seated lateral leans, MAX A for toileting with +2 assist for any STS needed for daily cares.     Vision Baseline Vision/History: Wears glasses Wears Glasses: At  all times Patient Visual Report: No change from baseline       Perception     Praxis      Pertinent Vitals/Pain Pain Assessment: Faces Faces Pain Scale: Hurts whole lot Pain Location: LLE with mobility Pain Descriptors / Indicators: Aching;Grimacing;Guarding;Sore Pain Intervention(s): Limited activity within patient's tolerance;Monitored during session;Repositioned;Premedicated before session     Hand Dominance Left   Extremity/Trunk Assessment Upper Extremity Assessment Upper Extremity Assessment:  Generalized weakness (BUE generally weak with no focal weakness appreciated. Pt is unable to achieve shoulder flexion beyond ~80 without pain. Per son, this is baseline.)   Lower Extremity Assessment Lower Extremity Assessment: Generalized weakness;Defer to PT evaluation;LLE deficits/detail LLE Deficits / Details: s/p L IM nailing. WBAT LLE: Unable to fully assess due to pain LLE Coordination: decreased gross motor   Cervical / Trunk Assessment Cervical / Trunk Assessment: Kyphotic   Communication Communication Communication: No difficulties   Cognition Arousal/Alertness: Awake/alert Behavior During Therapy: WFL for tasks assessed/performed Overall Cognitive Status: History of cognitive impairments - at baseline                                 General Comments: Per son, pt generally at baseline level for cognition. She has decreased STM and asks "what did I do to my hip" multiple times during session. Pt is easily re-directable to tasks. She is pleasant and cooperative t/o session.   General Comments  VSS during session. Pt remains on 2L [place] t/o session. SPO2 briefly down to 93% after functional mobility, but rebounds quickly to 95% with cues for PLB. Pt not on O2 at baseline.    Exercises Other Exercises Other Exercises: Pt/son educated on role of OT in acute setting, importance of functional activity during hospital stay, and complementary alternative pain management techniques including deep breathing and distraction. OT/PT facilitate bed/functional mobility   Shoulder Instructions      Home Living Family/patient expects to be discharged to:: Assisted living [facility])                             Home Equipment: Gilford Rile - 2 wheels;Wheelchair - manual   Additional Comments: Pt limited historian 2/2 hx of dementia. Pt son at bedside during evaluation, states pt was walking short distances with a RW, but otherwise using a WC for mobility recently. Per son,  pt living on SNF side of [place] for last year. Staff provide at least some assist with BADL tasks including bathing, dresssing, and toileting. Pt is able to feed herself and complete UB grooming tasks.      Prior Functioning/Environment Level of Independence: Needs assistance                 OT Problem List: Decreased strength;Decreased coordination;Cardiopulmonary status limiting activity;Pain;Decreased range of motion;Decreased activity tolerance;Decreased safety awareness;Decreased cognition;Impaired balance (sitting and/or standing);Decreased knowledge of use of DME or AE;Impaired UE functional use;Decreased knowledge of precautions      OT Treatment/Interventions: Self-care/ADL training;Therapeutic exercise;Therapeutic activities;Energy conservation;DME and/or AE instruction;Patient/family education;Balance training    OT Goals(Current goals can be found in the care plan section) Acute Rehab OT Goals Patient Stated Goal: To have less pain OT Goal Formulation: With patient/family Time For Goal Achievement: 10/15/20 Potential to Achieve Goals: Good ADL Goals Pt Will Perform Lower Body Dressing: sit to/from stand;with min assist;with adaptive equipment Pt Will Transfer to Toilet: stand pivot transfer;with  min assist;bedside commode (c LRAD AE) Pt Will Perform Toileting - Clothing Manipulation and hygiene: with adaptive equipment;sitting/lateral leans;with min assist  OT Frequency: Min 2X/week   Barriers to D/C:            Co-evaluation PT/OT/SLP Co-Evaluation/Treatment: Yes Reason for Co-Treatment: For patient/therapist safety;To address functional/ADL transfers;Necessary to address cognition/behavior during functional activity PT goals addressed during session: Mobility/safety with mobility;Balance;Strengthening/ROM OT goals addressed during session: ADL's and self-care      AM-PAC OT "6 Clicks" Daily Activity     Outcome Measure Help from another person eating  meals?: A Little Help from another person taking care of personal grooming?: A Little Help from another person toileting, which includes using toliet, bedpan, or urinal?: A Lot Help from another person bathing (including washing, rinsing, drying)?: A Lot Help from another person to put on and taking off regular upper body clothing?: A Little Help from another person to put on and taking off regular lower body clothing?: A Lot 6 Click Score: 15   End of Session Equipment Utilized During Treatment: Gait belt;Oxygen Nurse Communication: Mobility status  Activity Tolerance: Patient tolerated treatment well Patient left: in chair;with call bell/phone within reach;with chair alarm set;with family/visitor present  OT Visit Diagnosis: Other abnormalities of gait and mobility (R26.89);Pain;Muscle weakness (generalized) (M62.81) Pain - Right/Left: Left Pain - part of body: Hip                Time: 9539-6728 OT Time Calculation (min): 38 min Charges:  OT General Charges $OT Visit: 1 Visit OT Evaluation $OT Eval Moderate Complexity: 1 Mod OT Treatments $Self Care/Home Management : 8-22 mins  Shara Blazing, M.S., OTR/L Ascom: [phone] 10/01/20, 11:38 AM

## 2020-10-01 NOTE — Evaluation (Signed)
Physical Therapy Evaluation Patient Details Name: Sonya Carey MRN: [id number] DOB: 07-21-1924 Today's Date: 10/01/2020   History of Present Illness  Pt is a 87 y.o. female presenting to hospital 7/18 with L hip pain s/p tripping going to the bathroom.  Pt s/p intramedullary nailing of L femur with cephalomedullary device 7/19 secondary L intertrochanteric hip fx.  PMH includes colon CA s/p surgery, 5th metacarpal fx, htn, macular degeneration, thyroid disease, pneumothorax, multiple rib fx's 2020, carpal tunnel release, hernia repair, dementia.  Clinical Impression  PT/OT co-evaluation performed.  Prior to hospital admission, pt was walking short distances with RW and assist but otherwise using w/c for mobility; lives at [facility] side of [place].  During session pt pleasantly confused and often repetitively asking what she did to her L hip during session (impaired STM noted).  Currently pt is 2 assist with bed mobility and stand pivot transfer bed to recliner; assist required for sitting balance d/t posterior R lean (improved with cueing and repositioning).  Pt would benefit from skilled PT to address noted impairments and functional limitations (see below for any additional details).  Upon hospital discharge, pt would benefit from SNF.    Follow Up Recommendations SNF    Equipment Recommendations  Rolling walker with 5" wheels;3in1 (PT);Wheelchair (measurements PT);Wheelchair cushion (measurements PT)    Recommendations for Other Services OT consult     Precautions / Restrictions Precautions Precautions: Fall Restrictions Weight Bearing Restrictions: Yes LLE Weight Bearing: Weight bearing as tolerated      Mobility  Bed Mobility Overal bed mobility: Needs Assistance Bed Mobility: Supine to Sit     Supine to sit: Mod assist;+2 for physical assistance     General bed mobility comments: assist for trunk and B LE's semi-supine to sitting edge of bed; vc's for technique     Transfers Overall transfer level: Needs assistance Equipment used: 2 person hand held assist Transfers: Stand Pivot Transfers   Stand pivot transfers: Max assist;Mod assist;+2 physical assistance       General transfer comment: stand pivot bed to recliner towards R side with 2 assist and vc's for technique  Ambulation/Gait             General Gait Details: not appropriate at this time  Stairs            Wheelchair Mobility    Modified Rankin (Stroke Patients Only)       Balance Overall balance assessment: Needs assistance Sitting-balance support: Feet supported;Bilateral upper extremity supported Sitting balance-Leahy Scale: Poor Sitting balance - Comments: pt initially mod to max assist for sitting balance (posterior R lean); with repositioning and cuieng for hand placement able to progress to close SBA briefly; tends to sit leaning towards R side (d/t L hip pain) Postural control: Posterior lean;Right lateral lean Standing balance support: During functional activity;Bilateral upper extremity supported Standing balance-Leahy Scale: Zero Standing balance comment: 2 assist required for standing balance                             Pertinent Vitals/Pain Pain Assessment: Faces Faces Pain Scale: Hurts whole lot Pain Location: LLE with mobility Pain Descriptors / Indicators: Aching;Grimacing;Guarding;Sore Pain Intervention(s): Limited activity within patient's tolerance;Monitored during session;Repositioned;Premedicated before session    Home Living Family/patient expects to be discharged to:: Assisted living ([facility]               Home Equipment: Environmental consultant - 2 wheels;Wheelchair -  manual Additional Comments: Pt limited historian secondary to h/o dementia.    Prior Function Level of Independence: Needs assistance   Gait / Transfers Assistance Needed: Per pt's son, pt was walking short distances with a RW but otherwise using w/c  for mobility recently.  ADL's / Homemaking Assistance Needed: Per OT (information from pt's son) "Staff provide at least some assist with BADL tasks including bathing, dresssing, and toileting. Pt is able to feed herself and complete UB grooming tasks".  Comments: Pt has been living at [facility] (SNF side) for past year.     Hand Dominance   Dominant Hand: Left    Extremity/Trunk Assessment   Upper Extremity Assessment Upper Extremity Assessment: Defer to OT evaluation    Lower Extremity Assessment Lower Extremity Assessment: Generalized weakness;Defer to PT evaluation;LLE deficits/detail LLE Deficits / Details: at least 3/5 AROM ankle DF/PF; at least 3-/5 AROM knee flexion/extension; at least 2+/5 hip flexion (limited d/t L hip pain) (R LE with generalized weakness) LLE: Unable to fully assess due to pain LLE Coordination: decreased gross motor    Cervical / Trunk Assessment Cervical / Trunk Assessment: Kyphotic  Communication   Communication: No difficulties  Cognition Arousal/Alertness: Awake/alert Behavior During Therapy: WFL for tasks assessed/performed Overall Cognitive Status: History of cognitive impairments - at baseline                                 General Comments: Pt with impaired STM and frequently asking what she did to her hip during session but easily redirected.      General Comments General comments (skin integrity, edema, etc.): Very mild drainage noted to L hip dressings.  Pt agreeable to PT session.  Pt's son present during session.    Exercises Total Joint Exercises Heel Slides: AAROM;Strengthening;Left;10 reps;Supine Long Arc Quad: AROM;Right;AAROM;Left;Strengthening;10 reps;Seated   Assessment/Plan    PT Assessment Patient needs continued PT services  PT Problem List Decreased strength;Decreased activity tolerance;Decreased balance;Decreased mobility;Decreased knowledge of use of DME;Decreased knowledge of  precautions;Pain;Decreased skin integrity       PT Treatment Interventions DME instruction;Gait training;Functional mobility training;Therapeutic activities;Therapeutic exercise;Balance training;Patient/family education    PT Goals (Current goals can be found in the Care Plan section)  Acute Rehab PT Goals Patient Stated Goal: To have less pain PT Goal Formulation: With patient Time For Goal Achievement: 10/15/20 Potential to Achieve Goals: Fair    Frequency BID   Barriers to discharge Decreased caregiver support      Co-evaluation PT/OT/SLP Co-Evaluation/Treatment: Yes Reason for Co-Treatment: For patient/therapist safety;To address functional/ADL transfers;Necessary to address cognition/behavior during functional activity PT goals addressed during session: Mobility/safety with mobility;Balance;Strengthening/ROM OT goals addressed during session: ADL's and self-care       AM-PAC PT "6 Clicks" Mobility  Outcome Measure Help needed turning from your back to your side while in a flat bed without using bedrails?: A Lot Help needed moving from lying on your back to sitting on the side of a flat bed without using bedrails?: Total Help needed moving to and from a bed to a chair (including a wheelchair)?: Total Help needed standing up from a chair using your arms (e.g., wheelchair or bedside chair)?: Total Help needed to walk in hospital room?: Total Help needed climbing 3-5 steps with a railing? : Total 6 Click Score: 7    End of Session Equipment Utilized During Treatment: Gait belt;Oxygen (2 L O2 via nasal cannula) Activity Tolerance: Patient  tolerated treatment well Patient left: in chair;with call bell/phone within reach;with chair alarm set;with family/visitor present;with nursing/sitter in room;with SCD's reapplied;Other (comment) (B heels floating via pillow support) Nurse Communication: Mobility status;Precautions;Weight bearing status PT Visit Diagnosis: Other  abnormalities of gait and mobility (R26.89);Muscle weakness (generalized) (M62.81);History of falling (Z91.81);Pain Pain - Right/Left: Left Pain - part of body: Hip    Time: 3838-1840 PT Time Calculation (min) (ACUTE ONLY): 34 min   Charges:   PT Evaluation $PT Eval Low Complexity: 1 Low PT Treatments $Therapeutic Activity: 8-22 mins       Leitha Bleak, PT 10/01/20, 11:24 AM

## 2020-10-01 NOTE — NC FL2 (Signed)
[place] LEVEL OF CARE SCREENING TOOL     IDENTIFICATION  Patient Name: Sonya Carey Birthdate: [birth year] Sex: female Admission Date (Current Location): 09/29/2020  [facility] and [place] Number:  Engineering geologist and Address:  [facility], [address] [place], [place] [address]      Provider Number: [id number]  Attending Physician Name and Address:  Loletha Grayer, MD  Relative Name and Phone Number:  Christin Bach [phone]    Current Level of Care: Hospital Recommended Level of Care: [place] Prior Approval Number:    Date Approved/Denied:   PASRR Number: [id number]  Discharge Plan: SNF    Current Diagnoses: Patient Active Problem List   Diagnosis Date Noted   Hypothyroidism 09/30/2020   Essential hypertension 09/30/2020   Closed left hip fracture, initial encounter ([place]) 09/30/2020   Closed intertrochanteric fracture of left femur ([place]) 09/29/2020   Iron deficiency anemia    Fall    Pneumothorax    Multiple rib fractures 03/01/2019   DNI (do not intubate) 03/01/2019   Dementia, old age, without behavioral disturbance ([place]) 12/27/2017    Orientation RESPIRATION BLADDER Height & Weight     Self, Time, Situation, Place    External catheter Weight:   Height:     BEHAVIORAL SYMPTOMS/MOOD NEUROLOGICAL BOWEL NUTRITION STATUS      Continent Diet  AMBULATORY STATUS COMMUNICATION OF NEEDS Skin   Extensive Assist Verbally Surgical wounds                       Personal Care Assistance Level of Assistance  Bathing, Dressing Bathing Assistance: Limited assistance   Dressing Assistance: Limited assistance     Functional Limitations Info             SPECIAL CARE FACTORS FREQUENCY  OT (By licensed OT), PT (By licensed PT)     PT Frequency: 5 times per week OT Frequency: 3 times per week            Contractures Contractures Info: Not present    Additional Factors Info  Code  Status, Allergies Code Status Info: DNR Allergies Info: Celebrex Celecoxib, Latex, Statins, Adhesive           Current Medications (10/01/2020):  This is the current hospital active medication list Current Facility-Administered Medications  Medication Dose Route Frequency Provider Last Rate Last Admin   acetaminophen (TYLENOL) tablet 1,000 mg  1,000 mg Oral Q8H Leim Fabry, MD   1,000 mg at 10/01/20 1330   amLODipine (NORVASC) tablet 2.5 mg  2.5 mg Oral Daily Loletha Grayer, MD       bisacodyl (DULCOLAX) suppository 10 mg  10 mg Rectal Daily PRN Leim Fabry, MD       calcium carbonate (TUMS - dosed in mg elemental calcium) chewable tablet 400 mg of elemental calcium  400 mg of elemental calcium Oral BID Leim Fabry, MD   400 mg of elemental calcium at 10/01/20 1023   Chlorhexidine Gluconate Cloth 2 % PADS 6 each  6 each Topical Daily Hosie Poisson, MD       docusate sodium (COLACE) capsule 100 mg  100 mg Oral BID Leim Fabry, MD   100 mg at 10/01/20 1025   donepezil (ARICEPT) tablet 5 mg  5 mg Oral QHS Leim Fabry, MD   5 mg at 09/30/20 2309   enoxaparin (LOVENOX) injection 40 mg  40 mg Subcutaneous Q24H Leim Fabry, MD   40 mg at 10/01/20 1029   [  START ON 10/02/2020] ferrous sulfate tablet 325 mg  325 mg Oral Q breakfast Loletha Grayer, MD       HYDROmorphone (DILAUDID) injection 0.2-0.4 mg  0.2-0.4 mg Intravenous Q4H PRN Leim Fabry, MD       memantine [facility]) tablet 10 mg  10 mg Oral BID Leim Fabry, MD   10 mg at 10/01/20 1023   methocarbamol (ROBAXIN) tablet 500 mg  500 mg Oral Q6H PRN Leim Fabry, MD       Or   methocarbamol (ROBAXIN) 500 mg in dextrose 5 % 50 mL IVPB  500 mg Intravenous Q6H PRN Leim Fabry, MD       metoCLOPramide (REGLAN) tablet 5-10 mg  5-10 mg Oral Q8H PRN Leim Fabry, MD       Or   metoCLOPramide (REGLAN) injection 5-10 mg  5-10 mg Intravenous Q8H PRN Leim Fabry, MD       ondansetron [facility]) tablet 4 mg  4 mg Oral Q6H PRN Leim Fabry, MD        Or   ondansetron [facility]) injection 4 mg  4 mg Intravenous Q6H PRN Leim Fabry, MD       oxyCODONE (Oxy IR/ROXICODONE) immediate release tablet 2.5-5 mg  2.5-5 mg Oral Q4H PRN Leim Fabry, MD       oxyCODONE (Oxy IR/ROXICODONE) immediate release tablet 5-10 mg  5-10 mg Oral Q4H PRN Leim Fabry, MD       senna-docusate (Senokot-S) tablet 1 tablet  1 tablet Oral QHS PRN Leim Fabry, MD       sertraline (ZOLOFT) tablet 150 mg  150 mg Oral QHS Leim Fabry, MD   150 mg at 09/30/20 2310   sodium phosphate (FLEET) 7-19 GM/118ML enema 1 enema  1 enema Rectal Once PRN Leim Fabry, MD       thyroid (ARMOUR) tablet 75 mg  75 mg Oral QAC breakfast Leim Fabry, MD   75 mg at 10/01/20 1024   traMADol (ULTRAM) tablet 50 mg  50 mg Oral Q6H PRN Leim Fabry, MD         Discharge Medications: Please see discharge summary for a list of discharge medications.  Relevant Imaging Results:  Relevant Lab Results:   Additional Information SS# [id number]  Su Hilt, RN

## 2020-10-01 NOTE — Progress Notes (Signed)
  Subjective: 1 Day Post-Op Procedure(s) (LRB): INTRAMEDULLARY (IM) NAIL INTERTROCHANTRIC (Left) Patient reports pain as mild.   Patient is well, and has had no acute complaints or problems PT and care management to assist with discharge planning. Negative for chest pain and shortness of breath Fever: no Gastrointestinal:Negative for nausea and vomiting  Objective: Vital signs in last 24 hours: Temp:  [97.4 F (36.3 C)-99 F (37.2 C)] 99 F (37.2 C) (07/20 0551) Pulse Rate:  [63-86] 84 (07/20 0551) Resp:  [11-21] 16 (07/20 0551) BP: (77-139)/(44-94) 119/52 (07/20 0551) SpO2:  [93 %-100 %] 100 % (07/20 0551)  Intake/Output from previous day:  Intake/Output Summary (Last 24 hours) at 10/01/2020 0748 Last data filed at 10/01/2020 0100 Gross per 24 hour  Intake 1744.73 ml  Output 675 ml  Net 1069.73 ml    Intake/Output this shift: No intake/output data recorded.  Labs: Recent Labs    09/29/20 2237 09/30/20 0543 10/01/20 0653  HGB 12.8 11.6* 9.0*   Recent Labs    09/30/20 0543 10/01/20 0653  WBC 10.2 7.5  RBC 3.96 3.08*  HCT 35.0* 27.0*  PLT 194 131*   Recent Labs    09/30/20 0543 10/01/20 0653  NA 134* 131*  K 4.5 4.0  CL 98 97*  CO2 29 25  BUN 11 13  CREATININE 0.70 0.68  GLUCOSE 104* 95  CALCIUM 8.8* 8.3*   Recent Labs    09/29/20 2253  INR 1.1     EXAM General - Patient is Alert and Appropriate Extremity - ABD soft Sensation intact distally Dorsiflexion/Plantar flexion intact Incision: dressing C/D/I No cellulitis present Dressing/Incision - clean, dry, no drainage Motor Function - intact, moving foot and toes well on exam.  Abdomen soft with normal bowel sounds.  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([place])    adenocarcinoma of transverse colon   Depression    Fracture, metacarpal 06/2016   5th metacarpal   History of chicken pox    Hypertension    Hypothyroidism    Macular degeneration    left eye   Thyroid  disease     Assessment/Plan: 1 Day Post-Op Procedure(s) (LRB): INTRAMEDULLARY (IM) NAIL INTERTROCHANTRIC (Left) Principal Problem:   Closed intertrochanteric fracture of left femur (HCC) Active Problems:   Iron deficiency anemia   Hypothyroidism   Dementia, old age, without behavioral disturbance ([place])   Hypertension   Closed left hip fracture, initial encounter ([place])  Estimated body mass index is 26.22 kg/m as calculated from the following:   Height as of 03/01/19: 5\' 3"  (1.6 m).   Weight as of 03/01/19: 67.1 kg. Advance diet Up with therapy D/C IV fluids when tolerating po intake.  Labs reviewed this AM. Na 131, WBC 7.5, Hg 9.0.  Encouraged increased oral intake. Up with therapy today. Patient is passing gas per her son. Will need SNF upon discharge, is currently living at [facility]  DVT Prophylaxis - Lovenox, Foot Pumps, and TED hose Weight-Bearing as tolerated to left leg  J. Cameron Proud, PA-C [facility] 10/01/2020, 7:48 AM

## 2020-10-01 NOTE — Progress Notes (Signed)
Physical Therapy Treatment Patient Details Name: Sonya Carey MRN: [id number] DOB: 1924-08-28 Today's Date: 10/01/2020    History of Present Illness Pt is a 87 y.o. female presenting to hospital 7/18 with L hip pain s/p tripping going to the bathroom.  Pt s/p intramedullary nailing of L femur with cephalomedullary device 7/19 secondary L intertrochanteric hip fx.  PMH includes colon CA s/p surgery, 5th metacarpal fx, htn, macular degeneration, thyroid disease, pneumothorax, multiple rib fx's 2020, carpal tunnel release, hernia repair, dementia.    PT Comments    Pt resting in recliner upon PT arrival; pt appearing confused (frequently asking what happened to her L hip) but also focusing on needing to use toilet (to urinate and for bowel movement).  Pt initially wanting to walk to bathroom but quickly realized she could not take any steps d/t L hip pain (mod assist x2 for transfers with RW and pt unable to take any steps with walker use, max cueing, and 2 assist).  Then performed stand pivot transfer recliner to [facility] towards R side (mod to max assist x2).  Pt able to have very small bowel movement and also urinate--nurse notified; pt requiring assist for clean-up prior to transfer back to bed (stand pivot towards R side mod to max assist x2).  Pt then assisted back to bed and repositioned for comfort.  Pt c/o L hip/thigh pain during sessions activities (nurse notified).  Will continue to focus on strengthening and progressive functional mobility per pt tolerance.   Follow Up Recommendations  SNF     Equipment Recommendations  Rolling walker with 5" wheels;3in1 (PT);Wheelchair (measurements PT);Wheelchair cushion (measurements PT)    Recommendations for Other Services OT consult     Precautions / Restrictions Precautions Precautions: Fall Restrictions Weight Bearing Restrictions: Yes LLE Weight Bearing: Weight bearing as tolerated    Mobility  Bed Mobility Overal bed mobility: Needs  Assistance Bed Mobility: Sit to Supine       Sit to supine: Min assist;Mod assist;+2 for physical assistance   General bed mobility comments: assist for trunk and B LE's sit to semi-supine in bed    Transfers Overall transfer level: Needs assistance Equipment used: Rolling walker (2 wheeled);None Transfers: Sit to/from [facility] Sit to Stand: Mod assist;+2 physical assistance Stand pivot transfers: Mod assist;Max assist;+2 physical assistance       General transfer comment: mod assist x2 to stand from recliner up to RW and control descent sitting into recliner; mod to max assist x2 stand pivot recliner to [facility] and then BSC to bed (all towards pt's R side); vc's for technique  Ambulation/Gait     Assistive device: Rolling walker (2 wheeled)       General Gait Details: pt unable to take any steps with max cueing and 2 assist and walker use   Stairs             Wheelchair Mobility    Modified Rankin (Stroke Patients Only)       Balance Overall balance assessment: Needs assistance Sitting-balance support: Feet supported;Bilateral upper extremity supported Sitting balance-Leahy Scale: Fair Sitting balance - Comments: mild R lean sitting edge of bed (CGA for safety) Postural control: Right lateral lean Standing balance support: Bilateral upper extremity supported Standing balance-Leahy Scale: Zero Standing balance comment: 2 assist required for standing balance                            Cognition Arousal/Alertness: Awake/alert Behavior  During Therapy: WFL for tasks assessed/performed Overall Cognitive Status: History of cognitive impairments - at baseline                                 General Comments: Pt with impaired STM and frequently asking what she did to her hip during session but easily redirected.      Exercises      General Comments  Pt agreeable to PT session.      Pertinent Vitals/Pain Pain  Assessment: Faces Faces Pain Scale: Hurts little more (8/10 with activity) Pain Location: L hip/thigh Pain Descriptors / Indicators: Aching;Grimacing;Guarding;Sore Pain Intervention(s): Limited activity within patient's tolerance;Monitored during session;Repositioned;Other (comment) (RN notified regarding pt's pain) Vitals (HR and O2 on room air) stable and WFL throughout treatment session.    Home Living                      Prior Function            PT Goals (current goals can now be found in the care plan section) Acute Rehab PT Goals Patient Stated Goal: To have less pain PT Goal Formulation: With patient Time For Goal Achievement: 10/15/20 Potential to Achieve Goals: Fair Progress towards PT goals: Progressing toward goals    Frequency    BID      PT Plan Current plan remains appropriate    Co-evaluation              AM-PAC PT "6 Clicks" Mobility   Outcome Measure  Help needed turning from your back to your side while in a flat bed without using bedrails?: A Lot Help needed moving from lying on your back to sitting on the side of a flat bed without using bedrails?: A Lot Help needed moving to and from a bed to a chair (including a wheelchair)?: Total Help needed standing up from a chair using your arms (e.g., wheelchair or bedside chair)?: Total Help needed to walk in hospital room?: Total Help needed climbing 3-5 steps with a railing? : Total 6 Click Score: 8    End of Session Equipment Utilized During Treatment: Gait belt;Oxygen (2 L O2 via nasal cannula) Activity Tolerance: Patient limited by pain Patient left: in bed;with call bell/phone within reach;with bed alarm set;with SCD's reapplied;Other (comment) (B heels floating via pillow support) Nurse Communication: Mobility status;Precautions;Weight bearing status;Patient requests pain meds PT Visit Diagnosis: Other abnormalities of gait and mobility (R26.89);Muscle weakness (generalized)  (M62.81);History of falling (Z91.81);Pain Pain - Right/Left: Left Pain - part of body: Hip     Time: 1350-1447 PT Time Calculation (min) (ACUTE ONLY): 57 min  Charges:  $Therapeutic Activity: 53-67 mins                     Leitha Bleak, PT 10/01/20, 3:51 PM

## 2020-10-01 NOTE — Progress Notes (Signed)
Patient ID: Sonya Carey, female   DOB: [birth year], 87 y.o.   MRN: [id number] [facility] PROGRESS NOTE  AYRIS CARANO TOI:[id number] DOB: [birth year] DOA: 09/29/2020 PCP: Tracie Harrier, MD  HPI/Subjective: Patient feeling okay.  Patient complains of some pain in her right back and right hip.  No pain in her left hip.  Admitted after a fall and found to have a left hip fracture.  Objective: Vitals:   10/01/20 0551 10/01/20 0812  BP: (!) 119/52 (!) 106/45  Pulse: 84 73  Resp: 16 18  Temp: 99 F (37.2 C) 98.9 F (37.2 C)  SpO2: 100% 100%    Intake/Output Summary (Last 24 hours) at 10/01/2020 1405 Last data filed at 10/01/2020 1357 Gross per 24 hour  Intake 1444.73 ml  Output 675 ml  Net 769.73 ml   There were no vitals filed for this visit.  ROS: Review of Systems  Respiratory:  Negative for shortness of breath.   Cardiovascular:  Negative for chest pain.  Gastrointestinal:  Negative for abdominal pain, nausea and vomiting.  Exam: Physical Exam HENT:     Head: Normocephalic.     Mouth/Throat:     Pharynx: No oropharyngeal exudate.  Eyes:     General: Lids are normal.     Conjunctiva/sclera: Conjunctivae normal.     Pupils: Pupils are equal, round, and reactive to light.  Cardiovascular:     Rate and Rhythm: Normal rate and regular rhythm.     Heart sounds: Normal heart sounds, S1 normal and S2 normal.  Pulmonary:     Breath sounds: Examination of the right-lower field reveals decreased breath sounds. Examination of the left-lower field reveals decreased breath sounds. Decreased breath sounds present. No wheezing, rhonchi or rales.  Musculoskeletal:     Right lower leg: No swelling.     Left lower leg: No swelling.  Skin:    General: Skin is warm.     Findings: No rash.  Neurological:     Mental Status: She is alert.     Comments: Answers some yes or no questions.  Hard of hearing.  Able to straight leg raise with right leg.  Not able to do with that  with the left leg.     Data Reviewed: Basic Metabolic Panel: Recent Labs  Lab 09/29/20 2237 09/30/20 0543 10/01/20 0653  NA 133* 134* 131*  K 3.6 4.5 4.0  CL 100 98 97*  CO2 25 29 25   GLUCOSE 92 104* 95  BUN 11 11 13   CREATININE 0.53 0.70 0.68  CALCIUM 8.9 8.8* 8.3*    CBC: Recent Labs  Lab 09/29/20 2237 09/30/20 0543 10/01/20 0653  WBC 11.3* 10.2 7.5  NEUTROABS 9.3*  --   --   HGB 12.8 11.6* 9.0*  HCT 37.1 35.0* 27.0*  MCV 85.7 88.4 87.7  PLT 224 194 131*     Recent Results (from the past 240 hour(s))  Resp Panel by RT-PCR (Flu A&B, Covid) Nasopharyngeal Swab     Status: None   Collection Time: 09/29/20 10:37 PM   Specimen: Nasopharyngeal Swab; Nasopharyngeal(NP) swabs in vial transport medium  Result Value Ref Range Status   SARS Coronavirus 2 by RT PCR NEGATIVE NEGATIVE Final    Comment: (NOTE) SARS-CoV-2 target nucleic acids are NOT DETECTED.  The SARS-CoV-2 RNA is generally detectable in upper respiratory specimens during the acute phase of infection. The lowest concentration of SARS-CoV-2 viral copies this assay can detect is 138 copies/mL. A negative result does not preclude  SARS-Cov-2 infection and should not be used as the sole basis for treatment or other patient management decisions. A negative result may occur with  improper specimen collection/handling, submission of specimen other than nasopharyngeal swab, presence of viral mutation(s) within the areas targeted by this assay, and inadequate number of viral copies(<138 copies/mL). A negative result must be combined with clinical observations, patient history, and epidemiological information. The expected result is Negative.  Fact Sheet for Patients:  EntrepreneurPulse.com.au  Fact Sheet for Healthcare Providers:  IncredibleEmployment.be  This test is no t yet approved or cleared by the [place] FDA and  has been authorized for detection and/or  diagnosis of SARS-CoV-2 by FDA under an Emergency Use Authorization (EUA). This EUA will remain  in effect (meaning this test can be used) for the duration of the COVID-19 declaration under Section 564(b)(1) of the Act, 21 U.S.C.section 360bbb-3(b)(1), unless the authorization is terminated  or revoked sooner.       Influenza A by PCR NEGATIVE NEGATIVE Final   Influenza B by PCR NEGATIVE NEGATIVE Final    Comment: (NOTE) The Xpert Xpress SARS-CoV-2/FLU/RSV plus assay is intended as an aid in the diagnosis of influenza from Nasopharyngeal swab specimens and should not be used as a sole basis for treatment. Nasal washings and aspirates are unacceptable for Xpert Xpress SARS-CoV-2/FLU/RSV testing.  Fact Sheet for Patients: EntrepreneurPulse.com.au  Fact Sheet for Healthcare Providers: IncredibleEmployment.be  This test is not yet approved or cleared by the [place] FDA and has been authorized for detection and/or diagnosis of SARS-CoV-2 by FDA under an Emergency Use Authorization (EUA). This EUA will remain in effect (meaning this test can be used) for the duration of the COVID-19 declaration under Section 564(b)(1) of the Act, 21 U.S.C. section 360bbb-3(b)(1), unless the authorization is terminated or revoked.  Performed at [facility], [address] [place], [place] [address]      Studies: DG Chest 1 View  Result Date: 09/29/2020 CLINICAL DATA:  Unwitnessed fall EXAM: CHEST  1 VIEW COMPARISON:  03/03/2019, FINDINGS: Small right-sided pleural effusion with hazy atelectasis right base. Diffuse coarse chronic interstitial opacity. Normal cardiac size. Aortic atherosclerosis. No pneumothorax. Scoliosis. Severe degenerative changes of both shoulders. IMPRESSION: Coarse chronic interstitial opacity with small right pleural effusion. Electronically Signed   By: Donavan Foil M.D.   On: 09/29/2020 20:13   CT Head Wo  Contrast  Result Date: 09/29/2020 CLINICAL DATA:  Found down.  Fall EXAM: CT HEAD WITHOUT CONTRAST CT CERVICAL SPINE WITHOUT CONTRAST TECHNIQUE: Multidetector CT imaging of the head and cervical spine was performed following the standard protocol without intravenous contrast. Multiplanar CT image reconstructions of the cervical spine were also generated. COMPARISON:  03/01/2019 FINDINGS: CT HEAD FINDINGS Brain: There is no mass, hemorrhage or extra-axial collection. There is generalized atrophy without lobar predilection. There is hypoattenuation of the periventricular white matter, most commonly indicating chronic ischemic microangiopathy. Vascular: No hyperdense vessel or unexpected vascular calcification. Skull: The visualized skull base, calvarium and extracranial soft tissues are normal. Sinuses/Orbits: No fluid levels or advanced mucosal thickening of the visualized paranasal sinuses. No mastoid or middle ear effusion. The orbits are normal. CT CERVICAL SPINE FINDINGS Alignment: No static subluxation. Facets are aligned. Occipital condyles are normally positioned. Skull base and vertebrae: No acute fracture. Soft tissues and spinal canal: No prevertebral fluid or swelling. No visible canal hematoma. Disc levels: No advanced spinal canal or neural foraminal stenosis. Upper chest: No pneumothorax, pulmonary nodule or pleural effusion. Other: Calcific aortic  atherosclerosis. IMPRESSION: 1. Chronic ischemic microangiopathy and generalized atrophy without acute intracranial abnormality. 2. No acute fracture or static subluxation of the cervical spine. Aortic Atherosclerosis (ICD10-I70.0). Electronically Signed   By: Ulyses Jarred M.D.   On: 09/29/2020 19:23   CT Cervical Spine Wo Contrast  Result Date: 09/29/2020 CLINICAL DATA:  Found down.  Fall EXAM: CT HEAD WITHOUT CONTRAST CT CERVICAL SPINE WITHOUT CONTRAST TECHNIQUE: Multidetector CT imaging of the head and cervical spine was performed following the  standard protocol without intravenous contrast. Multiplanar CT image reconstructions of the cervical spine were also generated. COMPARISON:  03/01/2019 FINDINGS: CT HEAD FINDINGS Brain: There is no mass, hemorrhage or extra-axial collection. There is generalized atrophy without lobar predilection. There is hypoattenuation of the periventricular white matter, most commonly indicating chronic ischemic microangiopathy. Vascular: No hyperdense vessel or unexpected vascular calcification. Skull: The visualized skull base, calvarium and extracranial soft tissues are normal. Sinuses/Orbits: No fluid levels or advanced mucosal thickening of the visualized paranasal sinuses. No mastoid or middle ear effusion. The orbits are normal. CT CERVICAL SPINE FINDINGS Alignment: No static subluxation. Facets are aligned. Occipital condyles are normally positioned. Skull base and vertebrae: No acute fracture. Soft tissues and spinal canal: No prevertebral fluid or swelling. No visible canal hematoma. Disc levels: No advanced spinal canal or neural foraminal stenosis. Upper chest: No pneumothorax, pulmonary nodule or pleural effusion. Other: Calcific aortic atherosclerosis. IMPRESSION: 1. Chronic ischemic microangiopathy and generalized atrophy without acute intracranial abnormality. 2. No acute fracture or static subluxation of the cervical spine. Aortic Atherosclerosis (ICD10-I70.0). Electronically Signed   By: Ulyses Jarred M.D.   On: 09/29/2020 19:23   CT Hip Left Wo Contrast  Result Date: 09/29/2020 CLINICAL DATA:  Left hip pain after a fall. Possible greater trochanter fracture on prior plain films Initial encounter. EXAM: CT OF THE LEFT HIP WITHOUT CONTRAST TECHNIQUE: Multidetector CT imaging of the left hip was performed according to the standard protocol. Multiplanar CT image reconstructions were also generated. COMPARISON:  Plain films left hip today. FINDINGS: Bones/Joint/Cartilage The patient has an acute, nondisplaced  fracture of the posterior aspect of the greater trochanter no intertrochanteric extension is identified. No other fracture is seen. The left hip is located. No lytic or sclerotic lesion. Mild left hip degenerative change noted. Ligaments Suboptimally assessed by CT. Muscles and Tendons Negative. Soft tissues Negative. IMPRESSION: Acute nondisplaced fracture of the greater trochanter as described. Electronically Signed   By: Inge Rise M.D.   On: 09/29/2020 21:34   MR HIP LEFT WO CONTRAST  Result Date: 09/30/2020 CLINICAL DATA:  Left hip pain, unwitnessed fall. EXAM: MR OF THE LEFT HIP WITHOUT CONTRAST TECHNIQUE: Multiplanar, multisequence MR imaging was performed. No intravenous contrast was administered. COMPARISON:  Multiple exams, including CT scan from 09/29/2020 FINDINGS: Bones: Intertrochanteric fracture of the left hip noted extending from the greater trochanter to the upper margin of the lesser trochanter. This is shown for example on image 18 series 6. Speckled edema along the posterosuperior acetabulum bilaterally favoring mild stress reaction. Linear bands of low T1 and high T2 signal in the sacral ala, for example on the right on image 5 series 7 and on the left on image 13 series 7, favoring stress reaction or early stress fracture. Substantial degenerative endplate findings and spondylosis in the lower lumbar spine. Articular cartilage and labrum Articular cartilage: Moderate to prominent degenerative chondral thinning bilaterally. Labrum:  Poorly assessed due to motion artifact. Joint or bursal effusion Joint effusion:  Upper normal amount of  fluid in the left hip joint. Bursae: Fluid along the left trochanteric bursa likely attributable to the fracture. Muscles and tendons Muscles and tendons: Considerable edema in the soft tissue surrounding the fracture especially the quadratus femoris and vastus lateralis. Other findings Miscellaneous:   No supplemental non-categorized findings.  IMPRESSION: 1. Acute left intertrochanteric fracture with surrounding edema in the soft tissues. 2. Linear areas of high T2 and low T1 signal in the sacral ala suggesting stress fractures or stress reaction. Speckled edema signal in the posterosuperior acetabulum bilaterally likewise suggesting stress reaction. 3. Lower lumbar spondylosis and degenerative disc disease with substantial degenerative endplate findings. 4. Moderate to prominent degenerative chondral thinning in both hips. Electronically Signed   By: Van Clines M.D.   On: 09/30/2020 08:04   DG HIP OPERATIVE UNILAT W OR W/O PELVIS LEFT  Result Date: 09/30/2020 CLINICAL DATA:  Hip fracture. EXAM: OPERATIVE LEFT HIP (WITH PELVIS IF PERFORMED) TECHNIQUE: Fluoroscopic spot image(s) were submitted for interpretation post-operatively. COMPARISON:  Preoperative imaging. FINDINGS: Four fluoroscopic spot images of the left hip obtained in the operating room. Intramedullary nail with trans trochanteric and distal locking screw fixation. Fluoroscopy time 2 minutes 46 seconds dose not provided. IMPRESSION: Procedural fluoroscopy for left hip fracture fixation. Electronically Signed   By: Keith Rake M.D.   On: 09/30/2020 14:47   DG Hip Unilat With Pelvis 2-3 Views Left  Result Date: 09/29/2020 CLINICAL DATA:  Fall EXAM: DG HIP (WITH OR WITHOUT PELVIS) 2-3V LEFT COMPARISON:  CT 03/04/2019 FINDINGS: SI joints are non widened. Pubic symphysis and rami are intact. There is questionable lucency at the greater trochanter of left femur on one view IMPRESSION: Questionable lucency at the greater trochanter of left femur. Suggest CT for further evaluation Electronically Signed   By: Donavan Foil M.D.   On: 09/29/2020 20:12    Scheduled Meds:  acetaminophen  1,000 mg Oral Q8H   amLODipine  2.5 mg Oral Daily   calcium carbonate  400 mg of elemental calcium Oral BID   Chlorhexidine Gluconate Cloth  6 each Topical Daily   docusate sodium  100 mg Oral  BID   donepezil  5 mg Oral QHS   enoxaparin (LOVENOX) injection  40 mg Subcutaneous Q24H   memantine  10 mg Oral BID   sertraline  150 mg Oral QHS   thyroid  75 mg Oral QAC breakfast   Continuous Infusions:  methocarbamol (ROBAXIN) IV      Assessment/Plan:  Closed intertrochanteric fracture of the left femur secondary to fall status post operative repair with intramedullary nailing.  Physical therapy evaluation.  Pain control with oral and IV meds.  Try to taper to Tylenol as quickly as possible. Iron deficiency anemia.  Continue oral iron supplementation.  Discontinue IV fluid hydration. Essential hypertension.  Low-dose amlodipine decreased the dose to 2.5 mg. Hypothyroidism unspecified on [place] Thyroid Dementia on donezepil.  Patient had to be reminded that she had a hip fracture surgery yesterday.        Code Status:     Code Status Orders  (From admission, onward)           Start     Ordered   09/30/20 0043  Do not attempt resuscitation (DNR)  Continuous       Question Answer Comment  In the event of cardiac or respiratory ARREST Do not call a "code blue"   In the event of cardiac or respiratory ARREST Do not perform Intubation, CPR, defibrillation or ACLS  In the event of cardiac or respiratory ARREST Use medication by any route, position, wound care, and other measures to relive pain and suffering. May use oxygen, suction and manual treatment of airway obstruction as needed for comfort.      09/30/20 0042           Code Status History     Date Active Date Inactive Code Status Order ID Comments User Context   09/29/2020 2357 09/30/2020 0042 Partial Code [id number]  Marcelyn Bruins, MD ED   03/01/2019 0506 03/06/2019 2053 Partial Code [id number]  Mansy, Arvella Merles, MD ED   11/10/2016 1316 11/10/2016 1842 Full Code [id number]  Dereck Leep, MD Inpatient   07/26/2016 1739 07/26/2016 2148 Full Code [id number]  Hooten, Laurice Record, MD Inpatient      Family  Communication: Spoke with son at the bedside Disposition Plan: Status is: Inpatient  Dispo: The patient is from: Rehab              Anticipated d/c is to: Rehab              Patient currently postoperative day 1 for left hip fracture repair   Difficult to place patient.  No.  Consultants: Orthopedic surgery  Procedures: Left hip repair  Time spent: 28 minutes  Teruko Joswick [employer]

## 2020-10-02 DIAGNOSIS — S72145S Nondisplaced intertrochanteric fracture of left femur, sequela: Secondary | ICD-10-CM | POA: Diagnosis not present

## 2020-10-02 DIAGNOSIS — F0390 Unspecified dementia without behavioral disturbance: Secondary | ICD-10-CM | POA: Diagnosis not present

## 2020-10-02 DIAGNOSIS — I10 Essential (primary) hypertension: Secondary | ICD-10-CM | POA: Diagnosis not present

## 2020-10-02 DIAGNOSIS — D509 Iron deficiency anemia, unspecified: Secondary | ICD-10-CM | POA: Diagnosis not present

## 2020-10-02 DIAGNOSIS — F32A Depression, unspecified: Secondary | ICD-10-CM

## 2020-10-02 LAB — CBC
HCT: 24.4 % — ABNORMAL LOW (ref 36.0–46.0)
Hemoglobin: 8.2 g/dL — ABNORMAL LOW (ref 12.0–15.0)
MCH: 30 pg (ref 26.0–34.0)
MCHC: 33.6 g/dL (ref 30.0–36.0)
MCV: 89.4 fL (ref 80.0–100.0)
Platelets: 159 10*3/uL (ref 150–400)
RBC: 2.73 MIL/uL — ABNORMAL LOW (ref 3.87–5.11)
RDW: 13.7 % (ref 11.5–15.5)
WBC: 8.1 10*3/uL (ref 4.0–10.5)
nRBC: 0 % (ref 0.0–0.2)

## 2020-10-02 MED ORDER — PANTOPRAZOLE SODIUM 40 MG PO TBEC
40.0000 mg | DELAYED_RELEASE_TABLET | Freq: Every day | ORAL | Status: DC
  Administered 2020-10-02 – 2020-10-04 (×3): 40 mg via ORAL
  Filled 2020-10-02 (×3): qty 1

## 2020-10-02 MED ORDER — OXYCODONE HCL 5 MG PO TABS: 5.0000 mg | ORAL_TABLET | ORAL | 0 refills | Status: DC | PRN

## 2020-10-02 MED ORDER — ENOXAPARIN SODIUM 40 MG/0.4ML IJ SOSY: 40.0000 mg | PREFILLED_SYRINGE | INTRAMUSCULAR | 0 refills | Status: DC

## 2020-10-02 NOTE — Progress Notes (Signed)
  Subjective: 2 Days Post-Op Procedure(s) (LRB): INTRAMEDULLARY (IM) NAIL INTERTROCHANTRIC (Left) Patient reports pain as mild.   Patient is well, and has had no acute complaints or problems Plan will be for d/c to SNF when appropriate. Patient declined PT due to pain today but denies any pain while in bed. Negative for chest pain and shortness of breath Fever: no Gastrointestinal:Negative for nausea and vomiting  Objective: Vital signs in last 24 hours: Temp:  [97.9 F (36.6 C)-98.9 F (37.2 C)] 97.9 F (36.6 C) (07/21 1108) Pulse Rate:  [72-89] 79 (07/21 1108) Resp:  [16-18] 18 (07/21 1108) BP: (104-113)/(42-57) 109/48 (07/21 1108) SpO2:  [98 %-100 %] 99 % (07/21 1108)  Intake/Output from previous day:  Intake/Output Summary (Last 24 hours) at 10/02/2020 1305 Last data filed at 10/02/2020 0502 Gross per 24 hour  Intake 240 ml  Output 300 ml  Net -60 ml    Intake/Output this shift: No intake/output data recorded.  Labs: Recent Labs    09/29/20 2237 09/30/20 0543 10/01/20 0653 10/02/20 0445  HGB 12.8 11.6* 9.0* 8.2*   Recent Labs    10/01/20 0653 10/02/20 0445  WBC 7.5 8.1  RBC 3.08* 2.73*  HCT 27.0* 24.4*  PLT 131* 159   Recent Labs    09/30/20 0543 10/01/20 0653  NA 134* 131*  K 4.5 4.0  CL 98 97*  CO2 29 25  BUN 11 13  CREATININE 0.70 0.68  GLUCOSE 104* 95  CALCIUM 8.8* 8.3*   Recent Labs    09/29/20 2253  INR 1.1     EXAM General - Patient is alert and is able to answer simple questions with me this morning. Extremity - ABD soft Sensation intact distally Dorsiflexion/Plantar flexion intact Incision: dressing C/D/I No cellulitis present Dressing/Incision - clean, dry, no drainage Motor Function - intact, moving foot and toes well on exam.  Abdomen soft with normal bowel sounds.  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([place])    adenocarcinoma of transverse colon   Depression    Fracture, metacarpal 06/2016   5th  metacarpal   History of chicken pox    Hypertension    Hypothyroidism    Macular degeneration    left eye   Thyroid disease     Assessment/Plan: 2 Days Post-Op Procedure(s) (LRB): INTRAMEDULLARY (IM) NAIL INTERTROCHANTRIC (Left) Principal Problem:   Closed intertrochanteric fracture of left femur (HCC) Active Problems:   Iron deficiency anemia   Hypothyroidism   Dementia, old age, without behavioral disturbance ([place])   Essential hypertension   Closed left hip fracture, initial encounter ([place])  Estimated body mass index is 26.22 kg/m as calculated from the following:   Height as of 03/01/19: 5\' 3"  (1.6 m).   Weight as of 03/01/19: 67.1 kg. Advance diet Up with therapy D/C IV fluids when tolerating po intake.  Labs reviewed this AM. Na 131 yesterday, WBC 7.5, Hg 8.2.  Encouraged increased oral intake. Up with therapy today. Patient has had a small BM. Will need SNF upon discharge, is currently living at [facility].  Upon discharge patient will need Lovenog 40mg  daily for 14 days for DVT prophylaxis. Staple can be removed by SNF on 10/14/20.  Follow-up with [facility] in 6 weeks for x-rays of the left femur.  DVT Prophylaxis - Lovenox, Foot Pumps, and TED hose Weight-Bearing as tolerated to left leg  J. Cameron Proud, PA-C [facility] Orthopaedic Surgery 10/02/2020, 1:05 PM

## 2020-10-02 NOTE — Progress Notes (Signed)
Occupational Therapy Treatment Patient Details Name: Sonya Carey MRN: [id number] DOB: 05/28/1924 Today's Date: 10/02/2020    History of present illness Pt is a 87 y.o. female presenting to hospital 7/18 with L hip pain s/p tripping going to the bathroom.  Pt s/p intramedullary nailing of L femur with cephalomedullary device 7/19 secondary L intertrochanteric hip fx.  PMH includes colon CA s/p surgery, 5th metacarpal fx, htn, macular degeneration, thyroid disease, pneumothorax, multiple rib fx's 2020, carpal tunnel release, hernia repair, dementia.   OT comments  Sonya Carey was seen for OT treatment on this date. Upon arrival to room pt in bed eating breakfast - noted to be finger feeding and states she is L handed and unable to reach table. Pt requires MAX A exit R side of bed and tolerates >25 min sitting EOB. MIN A self-feeding seated EOB - assist for sitting balance, intermittent MOD A for R lateral lean (suspect r/t 7/10 L hip pain). RN notified and in room to deliver pain medicine during session. MAX A hair brushing seated EOB - assist for poor UE ROM. TOTAL A return to bed. MAX A for LBD at bed level. Pt making good progress toward goals. Pt continues to benefit from skilled OT services to maximize return to PLOF and minimize risk of future falls, injury, caregiver burden, and readmission. Will continue to follow POC. Discharge recommendation remains appropriate.    Follow Up Recommendations  SNF;Supervision/Assistance - 24 hour    Equipment Recommendations  None recommended by OT    Recommendations for Other Services      Precautions / Restrictions Precautions Precautions: Fall Restrictions Weight Bearing Restrictions: Yes LLE Weight Bearing: Weight bearing as tolerated       Mobility Bed Mobility Overal bed mobility: Needs Assistance Bed Mobility: Sit to Supine;Supine to Sit     Supine to sit: Max assist;HOB elevated Sit to supine: Total assist   General bed  mobility comments: pt assists with RLE advancement and trunk    Transfers                 General transfer comment: not tested 2/2 7/10 pain sitting and pt deferred    Balance Overall balance assessment: Needs assistance Sitting-balance support: Feet supported;No upper extremity supported Sitting balance-Leahy Scale: Poor Sitting balance - Comments: mild R lean sitting edge of bed - MIN A for upright posture Postural control: Right lateral lean                                 ADL either performed or assessed with clinical judgement   ADL Overall ADL's : Needs assistance/impaired                                       General ADL Comments: MAX A for LBD at bed level. MIN A self-feeding seated EOB - assist for sitting balance, intermittent MOD A for R lateral lean (suspect r/t L hip pain). MAX A hair brushing seated EOB - assist for poor UE ROM      Cognition Arousal/Alertness: Awake/alert Behavior During Therapy: WFL for tasks assessed/performed Overall Cognitive Status: History of cognitive impairments - at baseline  General Comments: Pt with impaired STM and frequently asking what she did to her hip during session but easily redirected.        Exercises Exercises: Other exercises Other Exercises Other Exercises: Pt educated re: OT role, DME recs, falls prevention, pain mgmt, and HEP Other Exercises: LBD, grooming, self-feeding, sup<>sit, sitting balance/tolerance           Pertinent Vitals/ Pain       Pain Assessment: 0-10 Pain Score: 7  Pain Location: L hip/thigh Pain Descriptors / Indicators: Aching;Grimacing;Guarding;Sore Pain Intervention(s): Limited activity within patient's tolerance;Repositioned;Patient requesting pain meds-RN notified;RN gave pain meds during session         Frequency  Min 2X/week        Progress Toward Goals  OT Goals(current goals can now be found  in the care plan section)  Progress towards OT goals: Progressing toward goals  Acute Rehab OT Goals Patient Stated Goal: To have less pain OT Goal Formulation: With patient/family Time For Goal Achievement: 10/15/20 Potential to Achieve Goals: Good ADL Goals Pt Will Perform Lower Body Dressing: sit to/from stand;with min assist;with adaptive equipment Pt Will Transfer to Toilet: stand pivot transfer;with min assist;bedside commode Pt Will Perform Toileting - Clothing Manipulation and hygiene: with adaptive equipment;sitting/lateral leans;with min assist  Plan Discharge plan remains appropriate;Frequency remains appropriate       AM-PAC OT "6 Clicks" Daily Activity     Outcome Measure   Help from another person eating meals?: A Little Help from another person taking care of personal grooming?: A Little Help from another person toileting, which includes using toliet, bedpan, or urinal?: A Lot Help from another person bathing (including washing, rinsing, drying)?: A Lot Help from another person to put on and taking off regular upper body clothing?: A Little Help from another person to put on and taking off regular lower body clothing?: A Lot 6 Click Score: 15    End of Session    OT Visit Diagnosis: Other abnormalities of gait and mobility (R26.89);Pain;Muscle weakness (generalized) (M62.81) Pain - Right/Left: Left Pain - part of body: Hip   Activity Tolerance Patient tolerated treatment well   Patient Left in bed;with call bell/phone within reach;with bed alarm set   Nurse Communication Patient requests pain meds        Time: 1015-1056 OT Time Calculation (min): 41 min  Charges: OT General Charges $OT Visit: 1 Visit OT Treatments $Self Care/Home Management : 38-52 mins  Dessie Coma, M.S. OTR/L  10/02/20, 1:24 PM  ascom [phone]

## 2020-10-02 NOTE — Progress Notes (Signed)
PT Cancellation Note  Patient Details Name: Sonya Carey MRN: [id number] DOB: 03-16-1924   Cancelled Treatment:    Reason Eval/Treat Not Completed: Pain limiting ability to participate.  Attempted to see pt multiple times this morning--1st attempt pt with OT; 2nd attempt pt receiving bath with NT; and 3rd attempt pt resting in bed but c/o back and L hip pain and refusing any activity d/t this pain (nurse notified).  Will re-attempt PT session this afternoon.  Leitha Bleak, PT 10/02/20, 12:09 PM

## 2020-10-02 NOTE — Discharge Instructions (Signed)
Diet: As you were doing prior to hospitalization   Shower:  May shower but keep the wounds dry, use an occlusive plastic wrap, NO SOAKING IN TUB.  If the bandage gets wet, change with a clean dry gauze.  Dressing:  You may change your dressing as needed. Change the dressing with sterile gauze dressing.    Activity:  Increase activity slowly as tolerated, but follow the weight bearing instructions below.  No lifting or driving for 6 weeks.  Weight Bearing:   Weight bearing as tolerated to left lower extremity  To prevent constipation: you may use a stool softener such as -  Colace (over the counter) 100 mg by mouth twice a day  Drink plenty of fluids (prune juice may be helpful) and high fiber foods Miralax (over the counter) for constipation as needed.    Itching:  If you experience itching with your medications, try taking only a single pain pill, or even half a pain pill at a time.  You may take up to 10 pain pills per day, and you can also use benadryl over the counter for itching or also to help with sleep.   Precautions:  If you experience chest pain or shortness of breath - call 911 immediately for transfer to the hospital emergency department!!  If you develop a fever greater that 101 F, purulent drainage from wound, increased redness or drainage from wound, or calf pain-Call [employer]

## 2020-10-02 NOTE — Progress Notes (Addendum)
Patient ID: Sonya Carey, female   DOB: [birth year], 87 y.o.   MRN: [id number] [facility] PROGRESS NOTE  Sonya Carey [id number]:[id number] DOB: 1925/02/15 DOA: 09/29/2020 PCP: Tracie Harrier, MD  HPI/Subjective: Patient feels okay.  Offers no complaints.  No shortness of breath.  Admitted after a fall and found to have a hip fracture.  Objective: Vitals:   10/02/20 0743 10/02/20 1108  BP: (!) 113/52 (!) 109/48  Pulse: 72 79  Resp: 16 18  Temp: 98.5 F (36.9 C) 97.9 F (36.6 C)  SpO2: 100% 99%    Intake/Output Summary (Last 24 hours) at 10/02/2020 1436 Last data filed at 10/02/2020 0502 Gross per 24 hour  Intake 240 ml  Output 300 ml  Net -60 ml   There were no vitals filed for this visit.  ROS: Review of Systems  Respiratory:  Negative for shortness of breath.   Cardiovascular:  Negative for chest pain.  Gastrointestinal:  Negative for abdominal pain, nausea and vomiting.  Exam: Physical Exam HENT:     Head: Normocephalic.     Mouth/Throat:     Pharynx: No oropharyngeal exudate.  Eyes:     General: Lids are normal.     Conjunctiva/sclera: Conjunctivae normal.  Cardiovascular:     Rate and Rhythm: Normal rate and regular rhythm.     Heart sounds: Normal heart sounds, S1 normal and S2 normal.  Pulmonary:     Breath sounds: Examination of the right-lower field reveals decreased breath sounds. Examination of the left-lower field reveals decreased breath sounds. Decreased breath sounds present. No wheezing, rhonchi or rales.  Abdominal:     Palpations: Abdomen is soft.     Tenderness: There is no abdominal tenderness.  Musculoskeletal:     Right knee: No swelling.     Left knee: No swelling.  Skin:    General: Skin is warm.     Findings: No rash.  Neurological:     Mental Status: She is alert.     Comments: Answers questions appropriately.  Able to straight leg raise with right leg but unable to do it with her left leg.     Data Reviewed: Basic  Metabolic Panel: Recent Labs  Lab 09/29/20 2237 09/30/20 0543 10/01/20 0653  NA 133* 134* 131*  K 3.6 4.5 4.0  CL 100 98 97*  CO2 25 29 25   GLUCOSE 92 104* 95  BUN 11 11 13   CREATININE 0.53 0.70 0.68  CALCIUM 8.9 8.8* 8.3*    CBC: Recent Labs  Lab 09/29/20 2237 09/30/20 0543 10/01/20 0653 10/02/20 0445  WBC 11.3* 10.2 7.5 8.1  NEUTROABS 9.3*  --   --   --   HGB 12.8 11.6* 9.0* 8.2*  HCT 37.1 35.0* 27.0* 24.4*  MCV 85.7 88.4 87.7 89.4  PLT 224 194 131* 159     Recent Results (from the past 240 hour(s))  Resp Panel by RT-PCR (Flu A&B, Covid) Nasopharyngeal Swab     Status: None   Collection Time: 09/29/20 10:37 PM   Specimen: Nasopharyngeal Swab; Nasopharyngeal(NP) swabs in vial transport medium  Result Value Ref Range Status   SARS Coronavirus 2 by RT PCR NEGATIVE NEGATIVE Final    Comment: (NOTE) SARS-CoV-2 target nucleic acids are NOT DETECTED.  The SARS-CoV-2 RNA is generally detectable in upper respiratory specimens during the acute phase of infection. The lowest concentration of SARS-CoV-2 viral copies this assay can detect is 138 copies/mL. A negative result does not preclude SARS-Cov-2 infection and should not be  used as the sole basis for treatment or other patient management decisions. A negative result may occur with  improper specimen collection/handling, submission of specimen other than nasopharyngeal swab, presence of viral mutation(s) within the areas targeted by this assay, and inadequate number of viral copies(<138 copies/mL). A negative result must be combined with clinical observations, patient history, and epidemiological information. The expected result is Negative.  Fact Sheet for Patients:  EntrepreneurPulse.com.au  Fact Sheet for Healthcare Providers:  IncredibleEmployment.be  This test is no t yet approved or cleared by the [place] FDA and  has been authorized for detection and/or diagnosis  of SARS-CoV-2 by FDA under an Emergency Use Authorization (EUA). This EUA will remain  in effect (meaning this test can be used) for the duration of the COVID-19 declaration under Section 564(b)(1) of the Act, 21 U.S.C.section 360bbb-3(b)(1), unless the authorization is terminated  or revoked sooner.       Influenza A by PCR NEGATIVE NEGATIVE Final   Influenza B by PCR NEGATIVE NEGATIVE Final    Comment: (NOTE) The Xpert Xpress SARS-CoV-2/FLU/RSV plus assay is intended as an aid in the diagnosis of influenza from Nasopharyngeal swab specimens and should not be used as a sole basis for treatment. Nasal washings and aspirates are unacceptable for Xpert Xpress SARS-CoV-2/FLU/RSV testing.  Fact Sheet for Patients: EntrepreneurPulse.com.au  Fact Sheet for Healthcare Providers: IncredibleEmployment.be  This test is not yet approved or cleared by the [place] FDA and has been authorized for detection and/or diagnosis of SARS-CoV-2 by FDA under an Emergency Use Authorization (EUA). This EUA will remain in effect (meaning this test can be used) for the duration of the COVID-19 declaration under Section 564(b)(1) of the Act, 21 U.S.C. section 360bbb-3(b)(1), unless the authorization is terminated or revoked.  Performed at [facility], [address] [place], [place] [address]      Studies: [place] HIP OPERATIVE UNILAT W OR W/O PELVIS LEFT  Result Date: 09/30/2020 CLINICAL DATA:  Hip fracture. EXAM: OPERATIVE LEFT HIP (WITH PELVIS IF PERFORMED) TECHNIQUE: Fluoroscopic spot image(s) were submitted for interpretation post-operatively. COMPARISON:  Preoperative imaging. FINDINGS: Four fluoroscopic spot images of the left hip obtained in the operating room. Intramedullary nail with trans trochanteric and distal locking screw fixation. Fluoroscopy time 2 minutes 46 seconds dose not provided. IMPRESSION: Procedural fluoroscopy for left hip  fracture fixation. Electronically Signed   By: Keith Rake M.D.   On: 09/30/2020 14:47    Scheduled Meds:  acetaminophen  1,000 mg Oral Q8H   calcium carbonate  400 mg of elemental calcium Oral BID   Chlorhexidine Gluconate Cloth  6 each Topical Daily   docusate sodium  100 mg Oral BID   donepezil  5 mg Oral QHS   enoxaparin (LOVENOX) injection  40 mg Subcutaneous Q24H   ferrous sulfate  325 mg Oral Q breakfast   memantine  10 mg Oral BID   sertraline  150 mg Oral QHS   thyroid  75 mg Oral QAC breakfast   Continuous Infusions:  methocarbamol (ROBAXIN) IV      Assessment/Plan:  Closed intertrochanteric fracture of the left femur secondary to fall. Patient had intramedullary nailing by orthopedic surgery on 09/30/2020 Postoperative anemia and iron deficiency anemia.  Oral supplementation.  Hemoglobin on presentation 12.8.  Today's hemoglobin down at 8.2.  We will get a type and cross tomorrow morning and check hemoglobin tomorrow morning.  May end up needing a transfusion if blood counts continue to drop.  Patient's daughter recalls  back in 2020 with a hospitalization similar episode happened. Essential hypertension with blood pressure being on the lower side will hold amlodipine at this point Hypothyroidism unspecified on [place] Thyroid Dementia on donezepil and Namenda Depression on Zoloft        Code Status:     Code Status Orders  (From admission, onward)           Start     Ordered   09/30/20 0043  Do not attempt resuscitation (DNR)  Continuous       Question Answer Comment  In the event of cardiac or respiratory ARREST Do not call a "code blue"   In the event of cardiac or respiratory ARREST Do not perform Intubation, CPR, defibrillation or ACLS   In the event of cardiac or respiratory ARREST Use medication by any route, position, wound care, and other measures to relive pain and suffering. May use oxygen, suction and manual treatment of airway obstruction as  needed for comfort.      09/30/20 0042           Code Status History     Date Active Date Inactive Code Status Order ID Comments User Context   09/29/2020 2357 09/30/2020 0042 Partial Code [id number]  Marcelyn Bruins, MD ED   03/01/2019 0506 03/06/2019 2053 Partial Code [id number]  Mansy, Arvella Merles, MD ED   11/10/2016 1316 11/10/2016 1842 Full Code [id number]  Dereck Leep, MD Inpatient   07/26/2016 1739 07/26/2016 2148 Full Code [id number]  Hooten, Laurice Record, MD Inpatient      Family Communication: Spoke with patient's daughter on the phone Disposition Plan: Status is: Inpatient  Dispo: The patient is from: Rehab              Anticipated d/c is to: Rehab              Patient currently with drop in hemoglobin from admission we will check hemoglobin again tomorrow and make sure stabilizes prior to making a decision about discharge   Difficult to place patient.  No.  Consultants: Orthopedic surgery  Procedures: Left hip repair  Time spent: 27 minutes  Adonys Wildes [employer]

## 2020-10-02 NOTE — Progress Notes (Signed)
Physical Therapy Treatment Patient Details Name: Sonya ROSEKRANS MRN: [id number] DOB: 1924/06/09 Today's Date: 10/02/2020    History of Present Illness Pt is a 87 y.o. female presenting to hospital 7/18 with L hip pain s/p tripping going to the bathroom.  Pt s/p intramedullary nailing of L femur with cephalomedullary device 7/19 secondary L intertrochanteric hip fx.  PMH includes colon CA s/p surgery, 5th metacarpal fx, htn, macular degeneration, thyroid disease, pneumothorax, multiple rib fx's 2020, carpal tunnel release, hernia repair, dementia.    PT Comments    Pt resting in bed upon PT arrival; pt agreeable to PT session with encouragement.  Tolerated LE ex's in bed with assist for L LE.  2 assist provided for bed mobility to improve pt's tolerance to activity (d/t L hip/thigh pain).  Close SBA for sitting balance d/t mild R lean (d/t L hip/thigh pain) with occasional vc's for upright posture.  Unable to stand 1st trial and able to just clear her bottom 2nd trial standing from bed with 2 assist (limited d/t L hip/thigh pain).  Nurse present end of session with pain medication.  Pt's O2 sats 90% at rest on room air beginning of session and fluctuating between 88-92% end of session (via pulse oximeter on R hand)--nurse present and aware.  Will continue to focus on strengthening and progressive functional mobility per pt tolerance.    Follow Up Recommendations  SNF     Equipment Recommendations  Rolling walker with 5" wheels;3in1 (PT);Wheelchair (measurements PT);Wheelchair cushion (measurements PT)    Recommendations for Other Services OT consult     Precautions / Restrictions Precautions Precautions: Fall Restrictions Weight Bearing Restrictions: Yes LLE Weight Bearing: Weight bearing as tolerated    Mobility  Bed Mobility Overal bed mobility: Needs Assistance Bed Mobility: Supine to Sit;Sit to Supine     Supine to sit: +2 for physical assistance Sit to supine: +2 for  physical assistance   General bed mobility comments: assist for trunk and B LE's; 2 assist provided to decrease pt's pain with bed mobility    Transfers Overall transfer level: Needs assistance Equipment used: Rolling walker (2 wheeled) Transfers: Sit to/from Stand Sit to Stand: Max assist;Total assist;+2 physical assistance         General transfer comment: pt unable to stand 1st attempt and only able to just clear her bottom from bed 2nd trial (limited d/t L hip pain) with 2 assist  Ambulation/Gait                 Stairs             Wheelchair Mobility    Modified Rankin (Stroke Patients Only)       Balance Overall balance assessment: Needs assistance Sitting-balance support: Bilateral upper extremity supported;Feet supported Sitting balance-Leahy Scale: Fair Sitting balance - Comments: steady static sitting with mild R lean (d/t L hip pain); occasional vc's for upright posture Postural control: Right lateral lean                                  Cognition Arousal/Alertness: Awake/alert Behavior During Therapy: WFL for tasks assessed/performed Overall Cognitive Status: History of cognitive impairments - at baseline                                 General Comments: Impaired STM noted; pleasant  Exercises Total Joint Exercises Ankle Circles/Pumps: AROM;Strengthening;Both;10 reps;Supine Short Arc Quad: AROM;Right;AAROM;Left;Strengthening;10 reps;Supine Heel Slides: AROM;Right;AAROM;Left;Strengthening;10 reps;Supine Hip ABduction/ADduction: AROM;Right;AAROM;Left;Strengthening;10 reps;Supine    General Comments        Pertinent Vitals/Pain Pain Assessment: Faces Faces Pain Scale: Hurts little more (8/10 with activity; 4/10 at rest) Pain Location: L hip/thigh Pain Descriptors / Indicators: Aching;Grimacing;Guarding;Sore Pain Intervention(s): Limited activity within patient's tolerance;Monitored during  session;Repositioned;Patient requesting pain meds-RN notified;Other (comment) (RN gave pt pain medication end of session) HR WFL during sessions activities.    Home Living                      Prior Function            PT Goals (current goals can now be found in the care plan section) Acute Rehab PT Goals Patient Stated Goal: To have less pain PT Goal Formulation: With patient Time For Goal Achievement: 10/15/20 Potential to Achieve Goals: Fair Progress towards PT goals: Progressing toward goals    Frequency    BID      PT Plan Current plan remains appropriate    Co-evaluation              AM-PAC PT "6 Clicks" Mobility   Outcome Measure  Help needed turning from your back to your side while in a flat bed without using bedrails?: A Lot Help needed moving from lying on your back to sitting on the side of a flat bed without using bedrails?: A Lot Help needed moving to and from a bed to a chair (including a wheelchair)?: Total Help needed standing up from a chair using your arms (e.g., wheelchair or bedside chair)?: Total Help needed to walk in hospital room?: Total Help needed climbing 3-5 steps with a railing? : Total 6 Click Score: 8    End of Session Equipment Utilized During Treatment: Gait belt Activity Tolerance: Patient limited by pain Patient left: in bed;with call bell/phone within reach;with bed alarm set;with nursing/sitter in room;with SCD's reapplied;Other (comment) (B heels floating via pillow support) Nurse Communication: Mobility status;Precautions;Weight bearing status;Other (comment) (pt's pain) PT Visit Diagnosis: Other abnormalities of gait and mobility (R26.89);Muscle weakness (generalized) (M62.81);History of falling (Z91.81);Pain Pain - Right/Left: Left Pain - part of body: Hip     Time: 6948-5462 PT Time Calculation (min) (ACUTE ONLY): 41 min  Charges:  $Therapeutic Exercise: 23-37 mins $Therapeutic Activity: 8-22 mins                     Leitha Bleak, PT 10/02/20, 4:33 PM

## 2020-10-03 DIAGNOSIS — F0390 Unspecified dementia without behavioral disturbance: Secondary | ICD-10-CM | POA: Diagnosis not present

## 2020-10-03 DIAGNOSIS — I10 Essential (primary) hypertension: Secondary | ICD-10-CM | POA: Diagnosis not present

## 2020-10-03 DIAGNOSIS — S72145S Nondisplaced intertrochanteric fracture of left femur, sequela: Secondary | ICD-10-CM | POA: Diagnosis not present

## 2020-10-03 DIAGNOSIS — R710 Precipitous drop in hematocrit: Secondary | ICD-10-CM | POA: Diagnosis not present

## 2020-10-03 LAB — CBC
HCT: 23.5 % — ABNORMAL LOW (ref 36.0–46.0)
Hemoglobin: 7.9 g/dL — ABNORMAL LOW (ref 12.0–15.0)
MCH: 29.9 pg (ref 26.0–34.0)
MCHC: 33.6 g/dL (ref 30.0–36.0)
MCV: 89 fL (ref 80.0–100.0)
Platelets: 166 10*3/uL (ref 150–400)
RBC: 2.64 MIL/uL — ABNORMAL LOW (ref 3.87–5.11)
RDW: 13.9 % (ref 11.5–15.5)
WBC: 8.7 10*3/uL (ref 4.0–10.5)
nRBC: 0.2 % (ref 0.0–0.2)

## 2020-10-03 LAB — PREPARE RBC (CROSSMATCH)

## 2020-10-03 LAB — SARS CORONAVIRUS 2 (TAT 6-24 HRS): SARS Coronavirus 2: NEGATIVE

## 2020-10-03 MED ORDER — CALCIUM CARBONATE ANTACID 500 MG PO CHEW: 400.0000 mg | CHEWABLE_TABLET | Freq: Two times a day (BID) | ORAL | 0 refills | Status: DC

## 2020-10-03 MED ORDER — PANTOPRAZOLE SODIUM 40 MG PO TBEC: 40.0000 mg | DELAYED_RELEASE_TABLET | Freq: Every day | ORAL | 0 refills | Status: DC

## 2020-10-03 MED ORDER — FUROSEMIDE 10 MG/ML IJ SOLN
20.0000 mg | Freq: Once | INTRAMUSCULAR | Status: AC
  Administered 2020-10-03: 20 mg via INTRAVENOUS
  Filled 2020-10-03: qty 4

## 2020-10-03 MED ORDER — ACETAMINOPHEN 325 MG PO TABS
650.0000 mg | ORAL_TABLET | Freq: Once | ORAL | Status: AC
  Administered 2020-10-03: 650 mg via ORAL
  Filled 2020-10-03: qty 2

## 2020-10-03 MED ORDER — MULTIVITAMIN ADULT PO TABS: 1.0000 | ORAL_TABLET | Freq: Every day | ORAL | Status: DC

## 2020-10-03 MED ORDER — ALENDRONATE SODIUM 70 MG PO TABS
70.0000 mg | ORAL_TABLET | ORAL | 0 refills | Status: DC
Start: 2020-10-03 — End: 2023-06-05

## 2020-10-03 MED ORDER — SODIUM CHLORIDE 0.9% IV SOLUTION: Freq: Once | INTRAVENOUS | Status: AC

## 2020-10-03 MED ORDER — FERROUS SULFATE 325 (65 FE) MG PO TABS: 325.0000 mg | ORAL_TABLET | Freq: Every day | ORAL | 0 refills | Status: DC

## 2020-10-03 NOTE — Care Management Important Message (Signed)
Important Message  Patient Details  Name: MAKYLIE UHLES MRN: [id number] Date of Birth: 1924/08/14   Medicare Important Message Given:  Yes    Called patients daughter information given about IM and appeal contact information if needed. Daughter voices understanding will come at 2:00pm today to pick up document from patients room.  Kerin Salen, RN 10/03/2020, 9:24 AM

## 2020-10-03 NOTE — Progress Notes (Signed)
Patient ID: Sonya Carey, female   DOB: 02/07/1925, 87 y.o.   MRN: [id number] [facility] PROGRESS NOTE  Sonya Carey [id number] DOB: 12-28-1924 DOA: 09/29/2020 PCP: Tracie Harrier, MD  HPI/Subjective: Patient feeling okay.  Hemoglobin continues to drift down.  Blood pressure little bit on the lower side.  Patient was initially hesitant about blood transfusion but was okay with it if her daughter agreed.  Objective: Vitals:   10/03/20 1152 10/03/20 1543  BP: (!) 96/45 (!) 113/54  Pulse: 84 78  Resp: 17 20  Temp: 98.6 F (37 C) 98.5 F (36.9 C)  SpO2: 93%     Intake/Output Summary (Last 24 hours) at 10/03/2020 1559 Last data filed at 10/03/2020 1502 Gross per 24 hour  Intake 780 ml  Output 425 ml  Net 355 ml   There were no vitals filed for this visit.  ROS: Review of Systems  Respiratory:  Negative for shortness of breath.   Cardiovascular:  Negative for chest pain.  Gastrointestinal:  Negative for abdominal pain, nausea and vomiting.  Exam: Physical Exam HENT:     Head: Normocephalic.     Mouth/Throat:     Pharynx: No oropharyngeal exudate.  Eyes:     General: Lids are normal.     Conjunctiva/sclera: Conjunctivae normal.  Cardiovascular:     Rate and Rhythm: Normal rate and regular rhythm.     Heart sounds: Normal heart sounds, S1 normal and S2 normal.  Pulmonary:     Breath sounds: Examination of the right-lower field reveals decreased breath sounds. Examination of the left-lower field reveals decreased breath sounds. Decreased breath sounds present. No wheezing, rhonchi or rales.  Abdominal:     Palpations: Abdomen is soft.     Tenderness: There is no abdominal tenderness.  Musculoskeletal:     Right lower leg: No swelling.     Left lower leg: No swelling.  Skin:    General: Skin is warm.     Findings: No rash.     Comments: Bruising on her back  Neurological:     Mental Status: She is alert.     Comments: Answers all questions  appropriately.     Data Reviewed: Basic Metabolic Panel: Recent Labs  Lab 09/29/20 2237 09/30/20 0543 10/01/20 0653  NA 133* 134* 131*  K 3.6 4.5 4.0  CL 100 98 97*  CO2 '25 29 25  '$ GLUCOSE 92 104* 95  BUN '11 11 13  '$ CREATININE 0.53 0.70 0.68  CALCIUM 8.9 8.8* 8.3*    CBC: Recent Labs  Lab 09/29/20 2237 09/30/20 0543 10/01/20 0653 10/02/20 0445 10/03/20 0444  WBC 11.3* 10.2 7.5 8.1 8.7  NEUTROABS 9.3*  --   --   --   --   HGB 12.8 11.6* 9.0* 8.2* 7.9*  HCT 37.1 35.0* 27.0* 24.4* 23.5*  MCV 85.7 88.4 87.7 89.4 89.0  PLT 224 194 131* 159 166     Recent Results (from the past 240 hour(s))  Resp Panel by RT-PCR (Flu A&B, Covid) Nasopharyngeal Swab     Status: None   Collection Time: 09/29/20 10:37 PM   Specimen: Nasopharyngeal Swab; Nasopharyngeal(NP) swabs in vial transport medium  Result Value Ref Range Status   SARS Coronavirus 2 by RT PCR NEGATIVE NEGATIVE Final    Comment: (NOTE) SARS-CoV-2 target nucleic acids are NOT DETECTED.  The SARS-CoV-2 RNA is generally detectable in upper respiratory specimens during the acute phase of infection. The lowest concentration of SARS-CoV-2 viral copies this assay can detect  is 138 copies/mL. A negative result does not preclude SARS-Cov-2 infection and should not be used as the sole basis for treatment or other patient management decisions. A negative result may occur with  improper specimen collection/handling, submission of specimen other than nasopharyngeal swab, presence of viral mutation(s) within the areas targeted by this assay, and inadequate number of viral copies(<138 copies/mL). A negative result must be combined with clinical observations, patient history, and epidemiological information. The expected result is Negative.  Fact Sheet for Patients:  EntrepreneurPulse.com.au  Fact Sheet for Healthcare Providers:  IncredibleEmployment.be  This test is no t yet approved or  cleared by the [place] FDA and  has been authorized for detection and/or diagnosis of SARS-CoV-2 by FDA under an Emergency Use Authorization (EUA). This EUA will remain  in effect (meaning this test can be used) for the duration of the COVID-19 declaration under Section 564(b)(1) of the Act, 21 U.S.C.section 360bbb-3(b)(1), unless the authorization is terminated  or revoked sooner.       Influenza A by PCR NEGATIVE NEGATIVE Final   Influenza B by PCR NEGATIVE NEGATIVE Final    Comment: (NOTE) The Xpert Xpress SARS-CoV-2/FLU/RSV plus assay is intended as an aid in the diagnosis of influenza from Nasopharyngeal swab specimens and should not be used as a sole basis for treatment. Nasal washings and aspirates are unacceptable for Xpert Xpress SARS-CoV-2/FLU/RSV testing.  Fact Sheet for Patients: EntrepreneurPulse.com.au  Fact Sheet for Healthcare Providers: IncredibleEmployment.be  This test is not yet approved or cleared by the [place] FDA and has been authorized for detection and/or diagnosis of SARS-CoV-2 by FDA under an Emergency Use Authorization (EUA). This EUA will remain in effect (meaning this test can be used) for the duration of the COVID-19 declaration under Section 564(b)(1) of the Act, 21 U.S.C. section 360bbb-3(b)(1), unless the authorization is terminated or revoked.  Performed at [facility], [address] [place], [place] [address]       Scheduled Meds:  acetaminophen  1,000 mg Oral Q8H   calcium carbonate  400 mg of elemental calcium Oral BID   Chlorhexidine Gluconate Cloth  6 each Topical Daily   docusate sodium  100 mg Oral BID   donepezil  5 mg Oral QHS   enoxaparin (LOVENOX) injection  40 mg Subcutaneous Q24H   ferrous sulfate  325 mg Oral Q breakfast   memantine  10 mg Oral BID   pantoprazole  40 mg Oral Daily   sertraline  150 mg Oral QHS   thyroid  75 mg Oral QAC breakfast    Continuous Infusions:  methocarbamol (ROBAXIN) IV      Assessment/Plan:  Drop in hemoglobin with postoperative anemia, IV fluids, blood draws.  Hemoglobin dropped from 12.8 on presentation down to 7.9 today.  With blood pressure being on the lower side will give 1 unit of packed red blood cells.  Benefits and risks explained to patient and daughter.  Recheck hemoglobin tomorrow. Closed intertrochanteric left femur fracture secondary to fall.  Operative repair on 719 with intramedullary nailing by orthopedic surgery. Essential hypertension with blood pressure being on the lower side.  Continue to hold amlodipine.  Transfuse 1 unit of packed red blood cells Hypothyroidism unspecified.  Continue Armour Thyroid Dementia.  Continue Aricept and Namenda Depression on Zoloft        Code Status:     Code Status Orders  (From admission, onward)           Start  Ordered   09/30/20 0043  Do not attempt resuscitation (DNR)  Continuous       Question Answer Comment  In the event of cardiac or respiratory ARREST Do not call a "code blue"   In the event of cardiac or respiratory ARREST Do not perform Intubation, CPR, defibrillation or ACLS   In the event of cardiac or respiratory ARREST Use medication by any route, position, wound care, and other measures to relive pain and suffering. May use oxygen, suction and manual treatment of airway obstruction as needed for comfort.      09/30/20 0042           Code Status History     Date Active Date Inactive Code Status Order ID Comments User Context   09/29/2020 2357 09/30/2020 0042 Partial Code [id number]  Marcelyn Bruins, MD ED   03/01/2019 0506 03/06/2019 2053 Partial Code [id number]  Mansy, Arvella Merles, MD ED   11/10/2016 1316 11/10/2016 1842 Full Code [id number]  Dereck Leep, MD Inpatient   07/26/2016 1739 07/26/2016 2148 Full Code [id number]  Hooten, Laurice Record, MD Inpatient      Family Communication: Spoke with patient's daughter on  the phone Disposition Plan: Status is: Inpatient  Dispo: The patient is from: Rehab              Anticipated d/c is to: Rehab              Patient currently receiving blood transfusion today   Difficult to place patient.  No.  Time spent: 31 minutes.  Discharge summary needed to be dictated today for disposition tomorrow.  [place]  [facility] [employer]

## 2020-10-03 NOTE — Progress Notes (Signed)
Physical Therapy Treatment Patient Details Name: Sonya Carey MRN: [id number] DOB: 12-04-1924 Today's Date: 10/03/2020    History of Present Illness Pt is a 87 y.o. female presenting to hospital 7/18 with L hip pain s/p tripping going to the bathroom.  Pt s/p intramedullary nailing of L femur with cephalomedullary device 7/19 secondary L intertrochanteric hip fx.  PMH includes colon CA s/p surgery, 5th metacarpal fx, htn, macular degeneration, thyroid disease, pneumothorax, multiple rib fx's 2020, carpal tunnel release, hernia repair, dementia.    PT Comments    Pt resting in bed upon PT arrival; agreeable to PT session.  0/10 L hip/thigh pain at rest beginning of session but increased to 6/10 with activity (pt received pain medication prior to PT session).  Tolerated LE ex's in bed with assist for L LE as needed fairly well.  Max assist semi-supine to sitting edge of bed.  Pt able to sit on edge of bed with close SBA (mild R lean d/t L hip pain) for a few minutes but then reporting needing to toilet (pt was uncomfortable on BSC last time so pt requesting to use bed pan).  Pt assisted back to bed with 2 assist and bed pan placed.  Mod to max assist for logrolling in bed to place and remove bed pan and 2nd assist (nurse) present to assist with hygiene.  Pt's O2 sats 90% on room air at rest beginning of session but decreased to 82-83% post activity; nurse present and pt placed on 2 L with O2 sats >93% end of session.  Plan for blood transfusion today.  Will continue to focus on strengthening and progressive functional mobility per pt tolerance.   Follow Up Recommendations  SNF     Equipment Recommendations  Rolling walker with 5" wheels;3in1 (PT);Wheelchair (measurements PT);Wheelchair cushion (measurements PT)    Recommendations for Other Services OT consult     Precautions / Restrictions Precautions Precautions: Fall Restrictions Weight Bearing Restrictions: Yes LLE Weight Bearing:  Weight bearing as tolerated    Mobility  Bed Mobility Overal bed mobility: Needs Assistance Bed Mobility: Supine to Sit;Rolling;Sit to Supine Rolling: Mod assist;Max assist   Supine to sit: Max assist;HOB elevated Sit to supine: +2 for physical assistance   General bed mobility comments: assist for trunk and B LE's; vc's for technique    Transfers                 General transfer comment: Deferred d/t pt needing to use bed pan  Ambulation/Gait                 Stairs             Wheelchair Mobility    Modified Rankin (Stroke Patients Only)       Balance Overall balance assessment: Needs assistance Sitting-balance support: Bilateral upper extremity supported;Feet supported Sitting balance-Leahy Scale: Fair Sitting balance - Comments: steady static sitting with mild R lean (d/t L hip pain); occasional vc's for upright posture                                    Cognition Arousal/Alertness: Awake/alert Behavior During Therapy: WFL for tasks assessed/performed Overall Cognitive Status: History of cognitive impairments - at baseline                                 General  Comments: Impaired STM noted; pleasant      Exercises Total Joint Exercises Ankle Circles/Pumps: AROM;Strengthening;Both;10 reps;Supine Quad Sets: AROM;Strengthening;Both;10 reps;Supine Short Arc Quad: AROM;Strengthening;Both;10 reps;Supine Heel Slides: AROM;Right;AAROM;Left;Strengthening;10 reps;Supine Hip ABduction/ADduction: AROM;Right;AAROM;Left;Strengthening;10 reps;Supine    General Comments  Nursing cleared pt for participation in physical therapy.  Pt agreeable to PT session.      Pertinent Vitals/Pain Pain Assessment: Faces Faces Pain Scale: Hurts a little bit (6/10 with activity; 2/10 at rest) Pain Location: L hip/thigh Pain Descriptors / Indicators: Aching;Grimacing;Guarding;Sore Pain Intervention(s): Limited activity within patient's  tolerance;Monitored during session;Premedicated before session;Repositioned    Home Living                      Prior Function            PT Goals (current goals can now be found in the care plan section) Acute Rehab PT Goals Patient Stated Goal: To have less pain PT Goal Formulation: With patient Time For Goal Achievement: 10/15/20 Potential to Achieve Goals: Fair Progress towards PT goals: Progressing toward goals    Frequency    BID      PT Plan Current plan remains appropriate    Co-evaluation              AM-PAC PT "6 Clicks" Mobility   Outcome Measure  Help needed turning from your back to your side while in a flat bed without using bedrails?: A Lot Help needed moving from lying on your back to sitting on the side of a flat bed without using bedrails?: A Lot Help needed moving to and from a bed to a chair (including a wheelchair)?: Total Help needed standing up from a chair using your arms (e.g., wheelchair or bedside chair)?: Total Help needed to walk in hospital room?: Total Help needed climbing 3-5 steps with a railing? : Total 6 Click Score: 8    End of Session Equipment Utilized During Treatment: Gait belt Activity Tolerance: Patient limited by pain Patient left: in bed;with call bell/phone within reach;with bed alarm set;with SCD's reapplied;Other (comment) (B heels floating via pillow support) Nurse Communication: Mobility status;Precautions;Weight bearing status PT Visit Diagnosis: Other abnormalities of gait and mobility (R26.89);Muscle weakness (generalized) (M62.81);History of falling (Z91.81);Pain Pain - Right/Left: Left Pain - part of body: Hip     Time: [id number] PT Time Calculation (min) (ACUTE ONLY): 42 min  Charges:  $Therapeutic Exercise: 8-22 mins $Therapeutic Activity: 23-37 mins                    Leitha Bleak, PT 10/03/20, 9:38 AM

## 2020-10-03 NOTE — TOC Progression Note (Signed)
Transition of Care [facility]) - Progression Note    Patient Details  Name: Sonya Carey MRN: [id number] Date of Birth: [birth year]  Transition of Care [facility]) CM/SW [place], RN Phone Number: 10/03/2020, 10:01 AM  Clinical Narrative:     The patient is to get a unit of blood today and would like to DC back to [facility], I contacted the admissions at [facility], they will need to DC summary today so that they can get her medications and then will be able to take her back tomorrow       Expected Discharge Plan and Services                                                 Social Determinants of Health (SDOH) Interventions    Readmission Risk Interventions No flowsheet data found.

## 2020-10-03 NOTE — Progress Notes (Signed)
  Subjective: 3 Days Post-Op Procedure(s) (LRB): INTRAMEDULLARY (IM) NAIL INTERTROCHANTRIC (Left) Patient reports pain as mild.   Patient is well, Hg 7.9 and BP 96/45.  Patient currently receiving one unit of PRBC. Plan will be for d/c to SNF when appropriate. Patient denies any pain at this time. Negative for chest pain and shortness of breath Fever: no Gastrointestinal:Negative for nausea and vomiting  Objective: Vital signs in last 24 hours: Temp:  [97.8 F (36.6 C)-99.5 F (37.5 C)] 98.6 F (37 C) (07/22 1152) Pulse Rate:  [79-100] 84 (07/22 1152) Resp:  [16-19] 17 (07/22 1152) BP: (96-109)/(40-53) 96/45 (07/22 1152) SpO2:  [82 %-100 %] 93 % (07/22 1152)  Intake/Output from previous day:  Intake/Output Summary (Last 24 hours) at 10/03/2020 1209 Last data filed at 10/03/2020 0622 Gross per 24 hour  Intake --  Output 250 ml  Net -250 ml    Intake/Output this shift: No intake/output data recorded.  Labs: Recent Labs    10/01/20 0653 10/02/20 0445 10/03/20 0444  HGB 9.0* 8.2* 7.9*   Recent Labs    10/02/20 0445 10/03/20 0444  WBC 8.1 8.7  RBC 2.73* 2.64*  HCT 24.4* 23.5*  PLT 159 166   Recent Labs    10/01/20 0653  NA 131*  K 4.0  CL 97*  CO2 25  BUN 13  CREATININE 0.68  GLUCOSE 95  CALCIUM 8.3*   No results for input(s): LABPT, INR in the last 72 hours.   EXAM General - Patient is alert and is able to answer simple questions with me this afternoon. Extremity - ABD soft Sensation intact distally Dorsiflexion/Plantar flexion intact Incision: dressing C/D/I No cellulitis present Dressing/Incision - clean, dry, no drainage Motor Function - intact, moving foot and toes well on exam.  Abdomen soft with normal bowel sounds.  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([place])    adenocarcinoma of transverse colon   Depression    Fracture, metacarpal 06/2016   5th metacarpal   History of chicken pox    Hypertension    Hypothyroidism     Macular degeneration    left eye   Thyroid disease     Assessment/Plan: 3 Days Post-Op Procedure(s) (LRB): INTRAMEDULLARY (IM) NAIL INTERTROCHANTRIC (Left) Principal Problem:   Closed intertrochanteric fracture of left femur (HCC) Active Problems:   Iron deficiency anemia   Hypothyroidism   Dementia, old age, without behavioral disturbance ([place])   Essential hypertension   Closed left hip fracture, initial encounter ([place])   Depression  Estimated body mass index is 26.22 kg/m as calculated from the following:   Height as of 03/01/19: '5\' 3"'$  (1.6 m).   Weight as of 03/01/19: 67.1 kg. Advance diet Up with therapy D/C IV fluids when tolerating po intake.  Labs reviewed, Hg 7.9, currently undergoing blood transfusion of 1 unit of PRBC. WBC 8.7 this AM. Up with therapy today. Patient has had a small BM. Will need SNF upon discharge, is currently living at [facility].  Plan for discharge to SNF tomorrow.  Upon discharge patient will need Lovenog '40mg'$  daily for 14 days for DVT prophylaxis. Staple can be removed by SNF on 10/14/20.  Follow-up with [place] in 6 weeks for x-rays of the left femur.  DVT Prophylaxis - Lovenox, Foot Pumps, and TED hose Weight-Bearing as tolerated to left leg  J. Cameron Proud, PA-C [facility] 10/03/2020, 12:09 PM

## 2020-10-03 NOTE — Progress Notes (Signed)
PT Cancellation Note  Patient Details Name: NGELA DONZE MRN: [id number] DOB: [birth year]   Cancelled Treatment:    Reason Eval/Treat Not Completed: Other (comment).  Pt currently receiving blood transfusion.  Will re-attempt PT treatment session at a later date/time.  Leitha Bleak, PT 10/03/20, 2:06 PM

## 2020-10-03 NOTE — Discharge Summary (Addendum)
[facility] NAME: Sonya Carey    MR#:  [id number]  DATE OF BIRTH:  1924/04/10  DATE OF ADMISSION:  09/29/2020 ADMITTING PHYSICIAN: Wyvonnia Dusky, MD  DATE OF DISCHARGE: 10/04/2020  PRIMARY CARE PHYSICIAN: Tracie Harrier, MD    ADMISSION DIAGNOSIS:  Fall [W19.XXXA] Closed intertrochanteric fracture of left femur ([place]) [S72.142A] Hip fracture, left, closed, initial encounter ([place]) [S72.002A] Closed left hip fracture, initial encounter ([place]) [S72.002A]  DISCHARGE DIAGNOSIS:  Principal Problem:   Closed intertrochanteric fracture of left femur ([place]) Active Problems:   Iron deficiency anemia   Hypothyroidism   Dementia, old age, without behavioral disturbance ([place])   Essential hypertension   Closed left hip fracture, initial encounter ([place])   Depression   SECONDARY DIAGNOSIS:   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([place])    adenocarcinoma of transverse colon   Depression    Fracture, metacarpal 06/2016   5th metacarpal   History of chicken pox    Hypertension    Hypothyroidism    Macular degeneration    left eye   Thyroid disease     HOSPITAL COURSE:   Closed intertrochanteric hip fracture left femur secondary to fall.  Medullary nailing by orthopedic surgery on 09/30/2020.  Physical therapy as tolerated.  Lovenox 40 mg subcutaneous daily injection for 14 days.  Convert over to Tylenol as quickly as possible and careful with pain medications.  Staples can be removed by nursing home on 10/14/2020.  Follow-up with [facility] orthopedics in 6 weeks for x-ray of the femur. Large drop in hemoglobin, postoperative anemia, dilutional and iron deficiency anemia.  Hemoglobin on presentation 12.8.  Today's hemoglobin 7.9.  Transfuse 1 unit of packed red blood cells today and recheck hemoglobin tomorrow to make sure stabilize. Essential hypertension.  With blood pressure on the lower side I will give 1 unit of  blood.  Continue to hold amlodipine at this point Hypothyroidism unspecified on [place] Thyroid Dementia on donezepil and Namenda Depression on Zoloft Patient is a DO NOT RESUSCITATE Oxygen 2 liters at night and as needed for pulse ox less than 88%  DISCHARGE CONDITIONS:  Satisfactory  CONSULTS OBTAINED:  Treatment Team:  Leim Fabry, MD  DRUG ALLERGIES:   Allergies  Allergen Reactions   Celebrex [Celecoxib] Other (See Comments)    GI upset   Latex Itching    NEGATIVE LATEX IgE (< 0.10) 11/04/2016   Statins Other (See Comments)    Muscle pain   Adhesive [Tape] Rash    DISCHARGE MEDICATIONS:   Allergies as of 10/03/2020       Reactions   Celebrex [celecoxib] Other (See Comments)   GI upset   Latex Itching   NEGATIVE LATEX IgE (< 0.10) 11/04/2016   Statins Other (See Comments)   Muscle pain   Adhesive [tape] Rash        Medication List     STOP taking these medications    amLODipine 5 MG tablet Commonly known as: NORVASC   Calcium-Magnesium 500-250 MG Tabs   chlorhexidine 0.12 % solution Commonly known as: PERIDEX   OVER THE COUNTER MEDICATION   OVER THE COUNTER MEDICATION   triamcinolone cream 0.1 % Commonly known as: KENALOG       TAKE these medications    acetaminophen 325 MG tablet Commonly known as: TYLENOL Take 650 mg by mouth 3 (three) times daily.   calcium carbonate 500 MG chewable tablet Commonly known as: TUMS -  dosed in mg elemental calcium Chew 2 tablets (400 mg of elemental calcium total) by mouth 2 (two) times daily.   cetirizine 10 MG tablet Commonly known as: ZYRTEC Take 10 mg by mouth daily.   cholecalciferol 25 MCG (1000 UNIT) tablet Commonly known as: VITAMIN D Take 1,000 Units by mouth daily.   docusate sodium 100 MG capsule Commonly known as: COLACE Take 1 capsule (100 mg total) by mouth 2 (two) times daily as needed for mild constipation.   donepezil 10 MG tablet Commonly known as: ARICEPT Take 10 mg by mouth  at bedtime.   enoxaparin 40 MG/0.4ML injection Commonly known as: LOVENOX Inject 0.4 mLs (40 mg total) into the skin daily.   ferrous sulfate 325 (65 FE) MG tablet Take 1 tablet (325 mg total) by mouth daily with breakfast. Start taking on: October 04, 2020 What changed: when to take this   hydroxypropyl methylcellulose / hypromellose 2.5 % ophthalmic solution Commonly known as: ISOPTO TEARS / GONIOVISC Place 1 drop into both eyes 2 (two) times daily as needed for dry eyes.   lactase 3000 units tablet Commonly known as: LACTAID Take 6,000 Units by mouth 3 (three) times daily with meals.   Lidocaine 4 % Ptch Apply 1 patch topically daily. What changed: Another medication with the same name was removed. Continue taking this medication, and follow the directions you see here.   magnesium hydroxide 400 MG/5ML suspension Commonly known as: MILK OF MAGNESIA Take 30 mLs by mouth daily as needed for mild constipation.   memantine 10 MG tablet Commonly known as: NAMENDA Take 10 mg by mouth 2 (two) times daily.   Multivitamin Adult Tabs Take 1 tablet by mouth daily. SHAKLEE "LIFE STRIP" MULTIVITAMIN PACK What changed: how much to take   NP Thyroid 15 MG tablet Generic drug: thyroid Take 15 mg by mouth every morning. What changed: Another medication with the same name was removed. Continue taking this medication, and follow the directions you see here.   NP Thyroid 60 MG tablet Generic drug: thyroid Take 60 mg by mouth in the morning. What changed: Another medication with the same name was removed. Continue taking this medication, and follow the directions you see here.   oxyCODONE 5 MG immediate release tablet Commonly known as: Oxy IR/ROXICODONE Take 1-2 tablets (5-10 mg total) by mouth every 4 (four) hours as needed for severe pain (pain score 7-10). What changed:  how much to take reasons to take this   pantoprazole 40 MG tablet Commonly known as: PROTONIX Take 1 tablet  (40 mg total) by mouth daily. Start taking on: October 04, 2020   sertraline 100 MG tablet Commonly known as: ZOLOFT Take 100 mg by mouth at bedtime.         DISCHARGE INSTRUCTIONS:   Follow-up with team at rehab 1 day Follow-up with orthopedics in 6 weeks  If you experience worsening of your admission symptoms, develop shortness of breath, life threatening emergency, suicidal or homicidal thoughts you must seek medical attention immediately by calling 911 or calling your MD immediately  if symptoms less severe.  You Must read complete instructions/literature along with all the possible adverse reactions/side effects for all the Medicines you take and that have been prescribed to you. Take any new Medicines after you have completely understood and accept all the possible adverse reactions/side effects.   Please note  You were cared for by a hospitalist during your hospital stay. If you have any questions about your discharge medications  or the care you received while you were in the hospital after you are discharged, you can call the unit and asked to speak with the hospitalist on call if the hospitalist that took care of you is not available. Once you are discharged, your primary care physician will handle any further medical issues. Please note that NO REFILLS for any discharge medications will be authorized once you are discharged, as it is imperative that you return to your primary care physician (or establish a relationship with a primary care physician if you do not have one) for your aftercare needs so that they can reassess your need for medications and monitor your lab values.    Today   CHIEF COMPLAINT:   Chief Complaint  Patient presents with   Fall    HISTORY OF PRESENT ILLNESS:  Sundee Alkire  is a 87 y.o. female came in after a fall and found to have left intertrochanteric fracture of the left femur   VITAL SIGNS:  Blood pressure (!) 96/45, pulse 84, temperature  98.6 F (37 C), resp. rate 17, SpO2 93 %.  I/O:   Intake/Output Summary (Last 24 hours) at 10/03/2020 1404 Last data filed at 10/03/2020 0622 Gross per 24 hour  Intake --  Output 250 ml  Net -250 ml    PHYSICAL EXAMINATION:  GENERAL:  87 y.o.-year-old patient lying in the bed with no acute distress.  EYES: Pupils equal, round, reactive to light and accommodation. No scleral icterus. HEENT: Head atraumatic, normocephalic. Oropharynx and nasopharynx clear.  LUNGS: Normal breath sounds bilaterally, no wheezing, rales,rhonchi or crepitation. No use of accessory muscles of respiration.  CARDIOVASCULAR: S1, S2 normal. No murmurs, rubs, or gallops.  ABDOMEN: Soft, non-tender, non-distended.  EXTREMITIES: No pedal edema, cyanosis, or clubbing.  NEUROLOGIC: Cranial nerves II through XII are intact.  Unable to straight leg raise with left leg.  Able to straight leg raise with right leg. PSYCHIATRIC: The patient is alert and answers questions SKIN: Bruising on her back  DATA REVIEW:   CBC Recent Labs  Lab 10/03/20 0444  WBC 8.7  HGB 7.9*  HCT 23.5*  PLT 166    Chemistries  Recent Labs  Lab 10/01/20 0653  NA 131*  K 4.0  CL 97*  CO2 25  GLUCOSE 95  BUN 13  CREATININE 0.68  CALCIUM 8.3*     Microbiology Results  Results for orders placed or performed during the hospital encounter of 09/29/20  Resp Panel by RT-PCR (Flu A&B, Covid) Nasopharyngeal Swab     Status: None   Collection Time: 09/29/20 10:37 PM   Specimen: Nasopharyngeal Swab; Nasopharyngeal(NP) swabs in vial transport medium  Result Value Ref Range Status   SARS Coronavirus 2 by RT PCR NEGATIVE NEGATIVE Final    Comment: (NOTE) SARS-CoV-2 target nucleic acids are NOT DETECTED.  The SARS-CoV-2 RNA is generally detectable in upper respiratory specimens during the acute phase of infection. The lowest concentration of SARS-CoV-2 viral copies this assay can detect is 138 copies/mL. A negative result does not  preclude SARS-Cov-2 infection and should not be used as the sole basis for treatment or other patient management decisions. A negative result may occur with  improper specimen collection/handling, submission of specimen other than nasopharyngeal swab, presence of viral mutation(s) within the areas targeted by this assay, and inadequate number of viral copies(<138 copies/mL). A negative result must be combined with clinical observations, patient history, and epidemiological information. The expected result is Negative.  Fact Sheet for Patients:  EntrepreneurPulse.com.au  Fact Sheet for Healthcare Providers:  IncredibleEmployment.be  This test is no t yet approved or cleared by the [place] FDA and  has been authorized for detection and/or diagnosis of SARS-CoV-2 by FDA under an Emergency Use Authorization (EUA). This EUA will remain  in effect (meaning this test can be used) for the duration of the COVID-19 declaration under Section 564(b)(1) of the Act, 21 U.S.C.section 360bbb-3(b)(1), unless the authorization is terminated  or revoked sooner.       Influenza A by PCR NEGATIVE NEGATIVE Final   Influenza B by PCR NEGATIVE NEGATIVE Final    Comment: (NOTE) The Xpert Xpress SARS-CoV-2/FLU/RSV plus assay is intended as an aid in the diagnosis of influenza from Nasopharyngeal swab specimens and should not be used as a sole basis for treatment. Nasal washings and aspirates are unacceptable for Xpert Xpress SARS-CoV-2/FLU/RSV testing.  Fact Sheet for Patients: EntrepreneurPulse.com.au  Fact Sheet for Healthcare Providers: IncredibleEmployment.be  This test is not yet approved or cleared by the [place] FDA and has been authorized for detection and/or diagnosis of SARS-CoV-2 by FDA under an Emergency Use Authorization (EUA). This EUA will remain in effect (meaning this test can be used) for the  duration of the COVID-19 declaration under Section 564(b)(1) of the Act, 21 U.S.C. section 360bbb-3(b)(1), unless the authorization is terminated or revoked.  Performed at [facility], [address], [place], [place] [address]      Management plans discussed with the patient, family and they are in agreement.  CODE STATUS:     Code Status Orders  (From admission, onward)           Start     Ordered   09/30/20 0043  Do not attempt resuscitation (DNR)  Continuous       Question Answer Comment  In the event of cardiac or respiratory ARREST Do not call a "code blue"   In the event of cardiac or respiratory ARREST Do not perform Intubation, CPR, defibrillation or ACLS   In the event of cardiac or respiratory ARREST Use medication by any route, position, wound care, and other measures to relive pain and suffering. May use oxygen, suction and manual treatment of airway obstruction as needed for comfort.      09/30/20 0042           Code Status History     Date Active Date Inactive Code Status Order ID Comments User Context   09/29/2020 2357 09/30/2020 0042 Partial Code [id number]  Marcelyn Bruins, MD ED   03/01/2019 0506 03/06/2019 2053 Partial Code [id number]  Mansy, Arvella Merles, MD ED   11/10/2016 1316 11/10/2016 1842 Full Code [id number]  Dereck Leep, MD Inpatient   07/26/2016 1739 07/26/2016 2148 Full Code [id number]  Dereck Leep, MD Inpatient       TOTAL TIME TAKING CARE OF THIS PATIENT: 31 minutes.    Loletha Grayer M.D on 10/03/2020 at 2:04 PM  Between 7am to 6pm - Pager - [phone]  After 6pm go to [email or web address] - password [email or web address]  [facility]  CC: Primary care physician; Tracie Harrier, MD

## 2020-10-04 DIAGNOSIS — R710 Precipitous drop in hematocrit: Secondary | ICD-10-CM | POA: Diagnosis not present

## 2020-10-04 DIAGNOSIS — R0902 Hypoxemia: Secondary | ICD-10-CM

## 2020-10-04 DIAGNOSIS — I10 Essential (primary) hypertension: Secondary | ICD-10-CM | POA: Diagnosis not present

## 2020-10-04 DIAGNOSIS — S72145S Nondisplaced intertrochanteric fracture of left femur, sequela: Secondary | ICD-10-CM | POA: Diagnosis not present

## 2020-10-04 DIAGNOSIS — E039 Hypothyroidism, unspecified: Secondary | ICD-10-CM | POA: Diagnosis not present

## 2020-10-04 LAB — CBC
HCT: 26 % — ABNORMAL LOW (ref 36.0–46.0)
Hemoglobin: 8.7 g/dL — ABNORMAL LOW (ref 12.0–15.0)
MCH: 30 pg (ref 26.0–34.0)
MCHC: 33.5 g/dL (ref 30.0–36.0)
MCV: 89.7 fL (ref 80.0–100.0)
Platelets: 165 10*3/uL (ref 150–400)
RBC: 2.9 MIL/uL — ABNORMAL LOW (ref 3.87–5.11)
RDW: 13.7 % (ref 11.5–15.5)
WBC: 7.6 10*3/uL (ref 4.0–10.5)
nRBC: 0 % (ref 0.0–0.2)

## 2020-10-04 LAB — TYPE AND SCREEN
ABO/RH(D): O POS
Antibody Screen: NEGATIVE
Unit division: 0

## 2020-10-04 LAB — BPAM RBC
Blood Product Expiration Date: 202208192359
ISSUE DATE / TIME: 202207221109
Unit Type and Rh: 5100

## 2020-10-04 NOTE — TOC Transition Note (Signed)
Transition of Care [facility]) - CM/SW Discharge Note   Patient Details  Name: Sonya Carey MRN: [id number] Date of Birth: June 28, 1924  Transition [facility]) CM/SW Contact:  Izola Price, RN Phone Number: 10/04/2020, 11:03 AM   Clinical Narrative:  Patient to be discharged today back to PTA facility at [facility]. Spoke with daughter and there will not be a discharge appeal. She agreed to discharge today. Facility updated and amended DC Summary with oxygen orders/parameters to go in AVS Summary per [facility] request. Also in-basket via destination Hub.  Communicated with provider and Unit RN as well. Daughter to arrive to discuss with patient at 1 pm. Will arrange transport for after they arrive. Simmie Davies RN CM     Final next level of care: [facility]) Barriers to Discharge: Barriers Resolved   Patient Goals and CMS Choice   CMS Medicare.gov Compare Post Acute Care list provided to::  (Returning to Pre-admission facility)    Discharge Placement              Patient chooses bed at: [facility] Patient to be transferred to facility by: [facility] Name of family member notified: Leslie Dales [phone] Patient and family notified of of transfer: 10/04/20 (Daugther to inform patient as well.)  Discharge Plan and Services                DME Arranged: N/A DME Agency: NA       HH Arranged: NA HH Agency: NA        Social Determinants of Health (SDOH) Interventions     Readmission Risk Interventions No flowsheet data found.

## 2020-10-04 NOTE — Progress Notes (Signed)
Physical Therapy Treatment Patient Details Name: Sonya Carey MRN: [id number] DOB: [birth year] Today's Date: 10/04/2020    History of Present Illness Pt is a 87 y.o. female presenting to hospital 7/18 with L hip pain s/p tripping going to the bathroom.  Pt s/p intramedullary nailing of L femur with cephalomedullary device 7/19 secondary L intertrochanteric hip fx.  PMH includes colon CA s/p surgery, 5th metacarpal fx, htn, macular degeneration, thyroid disease, pneumothorax, multiple rib fx's 2020, carpal tunnel release, hernia repair, dementia.    PT Comments    Pt seen this am for PT services. Pt with c/o 4/10 L hip pain and declined OOB until after breakfast.  Pt participated in B LE exercises with assist to attain full range on Left.  Pt required slow movements with moderate vc's for each exercise.  Full assist to attain upright sitting in bed for breakfast. Will check back this afternoon for increased mobility if pt does not d/c back to [facility].   Follow Up Recommendations  SNF     Equipment Recommendations  Rolling walker with 5" wheels;3in1 (PT);Wheelchair (measurements PT);Wheelchair cushion (measurements PT)    Recommendations for Other Services       Precautions / Restrictions Precautions Precautions: Fall Restrictions Weight Bearing Restrictions: Yes LLE Weight Bearing: Weight bearing as tolerated    Mobility  Bed Mobility                    Transfers                    Ambulation/Gait                 Stairs             Wheelchair Mobility    Modified Rankin (Stroke Patients Only)       Balance                                            Cognition Arousal/Alertness: Awake/alert Behavior During Therapy: WFL for tasks assessed/performed Overall Cognitive Status: History of cognitive impairments - at baseline                                 General Comments: Impaired STM noted;  pleasant      Exercises Total Joint Exercises Ankle Circles/Pumps: AROM;Strengthening;Both;10 reps;Supine Quad Sets: AROM;Strengthening;Both;10 reps;Supine Short Arc Quad: AROM;Strengthening;Both;10 reps;Supine Heel Slides: AROM;Right;AAROM;Left;Strengthening;10 reps;Supine Hip ABduction/ADduction: AROM;Right;AAROM;Left;Strengthening;10 reps;Supine General Exercises - Lower Extremity Heel Slides: AROM;Right;AAROM;Left;10 reps;Supine    General Comments General comments (skin integrity, edema, etc.): Pt on RA upon arrival, Sats between 90-92%      Pertinent Vitals/Pain Pain Assessment: 0-10 Pain Score: 4  Pain Location: L hip/thigh Pain Descriptors / Indicators: Aching;Grimacing;Guarding;Sore Pain Intervention(s): Monitored during session    Home Living                      Prior Function            PT Goals (current goals can now be found in the care plan section) Acute Rehab PT Goals Patient Stated Goal: To have less pain    Frequency    BID      PT Plan Current plan remains appropriate    Co-evaluation  AM-PAC PT "6 Clicks" Mobility   Outcome Measure  Help needed turning from your back to your side while in a flat bed without using bedrails?: A Lot Help needed moving from lying on your back to sitting on the side of a flat bed without using bedrails?: A Lot Help needed moving to and from a bed to a chair (including a wheelchair)?: Total Help needed standing up from a chair using your arms (e.g., wheelchair or bedside chair)?: Total Help needed to walk in hospital room?: Total Help needed climbing 3-5 steps with a railing? : Total 6 Click Score: 8    End of Session   Activity Tolerance: Patient limited by pain Patient left: in bed;with call bell/phone within reach;with bed alarm set;with SCD's reapplied;Other (comment) Nurse Communication: Mobility status;Precautions;Weight bearing status PT Visit Diagnosis: Other abnormalities  of gait and mobility (R26.89);Muscle weakness (generalized) (M62.81);History of falling (Z91.81);Pain Pain - Right/Left: Left Pain - part of body: Hip     Time: [id number] PT Time Calculation (min) (ACUTE ONLY): 20 min  Charges:  $Therapeutic Exercise: 8-22 mins                    Mikel Cella, PTA    [name] 10/04/2020, 12:15 PM

## 2020-10-04 NOTE — Progress Notes (Signed)
Pt discharged to [facility] facility.  IV removed without complication.  AVS and paperwork sent with pt.  Pt transported via ambulance.  Report called to Ronny Flurry, LPN

## 2020-10-04 NOTE — Progress Notes (Signed)
Patient ID: Sonya Carey, female   DOB: [birth year], 87 y.o.   MRN: [id number] [facility] PROGRESS NOTE  Sonya Carey [id number] DOB: 03/28/1924 DOA: 09/29/2020 PCP: Tracie Harrier, MD  HPI/Subjective: Patient seen this morning.  Not complaining of any shortness of breath.  No complaints of chest pain.  Admitted initially with fall and hip fracture.  Responded to a unit of blood.  Objective: Vitals:   10/04/20 0455 10/04/20 0738  BP: (!) 105/50 (!) 122/59  Pulse: 72 74  Resp: 18 18  Temp: 98 F (36.7 C) 98.5 F (36.9 C)  SpO2: 100% 100%    Intake/Output Summary (Last 24 hours) at 10/04/2020 0904 Last data filed at 10/04/2020 0545 Gross per 24 hour  Intake 780 ml  Output 825 ml  Net -45 ml   There were no vitals filed for this visit.  ROS: Review of Systems  Respiratory:  Negative for shortness of breath.   Cardiovascular:  Negative for chest pain.  Gastrointestinal:  Negative for abdominal pain, nausea and vomiting.  Exam: Physical Exam HENT:     Head: Normocephalic.     Mouth/Throat:     Pharynx: No oropharyngeal exudate.  Eyes:     General: Lids are normal.     Conjunctiva/sclera: Conjunctivae normal.  Cardiovascular:     Rate and Rhythm: Normal rate and regular rhythm.     Heart sounds: Normal heart sounds, S1 normal and S2 normal.  Pulmonary:     Breath sounds: Examination of the right-lower field reveals decreased breath sounds. Examination of the left-lower field reveals decreased breath sounds. Decreased breath sounds present. No wheezing, rhonchi or rales.  Abdominal:     Palpations: Abdomen is soft.     Tenderness: There is no abdominal tenderness.  Musculoskeletal:     Right lower leg: Swelling present.     Left lower leg: Swelling present.  Skin:    General: Skin is warm.     Findings: No rash.     Comments: Bruising left backside  Neurological:     Mental Status: She is alert.     Comments: Answers simple yes or no questions      Data Reviewed: Basic Metabolic Panel: Recent Labs  Lab 09/29/20 2237 09/30/20 0543 10/01/20 0653  NA 133* 134* 131*  K 3.6 4.5 4.0  CL 100 98 97*  CO2 '25 29 25  '$ GLUCOSE 92 104* 95  BUN '11 11 13  '$ CREATININE 0.53 0.70 0.68  CALCIUM 8.9 8.8* 8.3*    CBC: Recent Labs  Lab 09/29/20 2237 09/30/20 0543 10/01/20 0653 10/02/20 0445 10/03/20 0444 10/04/20 0559  WBC 11.3* 10.2 7.5 8.1 8.7 7.6  NEUTROABS 9.3*  --   --   --   --   --   HGB 12.8 11.6* 9.0* 8.2* 7.9* 8.7*  HCT 37.1 35.0* 27.0* 24.4* 23.5* 26.0*  MCV 85.7 88.4 87.7 89.4 89.0 89.7  PLT 224 194 131* 159 166 165     Recent Results (from the past 240 hour(s))  Resp Panel by RT-PCR (Flu A&B, Covid) Nasopharyngeal Swab     Status: None   Collection Time: 09/29/20 10:37 PM   Specimen: Nasopharyngeal Swab; Nasopharyngeal(NP) swabs in vial transport medium  Result Value Ref Range Status   SARS Coronavirus 2 by RT PCR NEGATIVE NEGATIVE Final    Comment: (NOTE) SARS-CoV-2 target nucleic acids are NOT DETECTED.  The SARS-CoV-2 RNA is generally detectable in upper respiratory specimens during the acute phase of infection. The lowest  concentration of SARS-CoV-2 viral copies this assay can detect is 138 copies/mL. A negative result does not preclude SARS-Cov-2 infection and should not be used as the sole basis for treatment or other patient management decisions. A negative result may occur with  improper specimen collection/handling, submission of specimen other than nasopharyngeal swab, presence of viral mutation(s) within the areas targeted by this assay, and inadequate number of viral copies(<138 copies/mL). A negative result must be combined with clinical observations, patient history, and epidemiological information. The expected result is Negative.  Fact Sheet for Patients:  EntrepreneurPulse.com.au  Fact Sheet for Healthcare Providers:  IncredibleEmployment.be  This  test is no t yet approved or cleared by the [place] FDA and  has been authorized for detection and/or diagnosis of SARS-CoV-2 by FDA under an Emergency Use Authorization (EUA). This EUA will remain  in effect (meaning this test can be used) for the duration of the COVID-19 declaration under Section 564(b)(1) of the Act, 21 U.S.C.section 360bbb-3(b)(1), unless the authorization is terminated  or revoked sooner.       Influenza A by PCR NEGATIVE NEGATIVE Final   Influenza B by PCR NEGATIVE NEGATIVE Final    Comment: (NOTE) The Xpert Xpress SARS-CoV-2/FLU/RSV plus assay is intended as an aid in the diagnosis of influenza from Nasopharyngeal swab specimens and should not be used as a sole basis for treatment. Nasal washings and aspirates are unacceptable for Xpert Xpress SARS-CoV-2/FLU/RSV testing.  Fact Sheet for Patients: EntrepreneurPulse.com.au  Fact Sheet for Healthcare Providers: IncredibleEmployment.be  This test is not yet approved or cleared by the [place] FDA and has been authorized for detection and/or diagnosis of SARS-CoV-2 by FDA under an Emergency Use Authorization (EUA). This EUA will remain in effect (meaning this test can be used) for the duration of the COVID-19 declaration under Section 564(b)(1) of the Act, 21 U.S.C. section 360bbb-3(b)(1), unless the authorization is terminated or revoked.  Performed at [facility], [place], [place] [address]   SARS CORONAVIRUS 2 (TAT 6-24 HRS) Nasopharyngeal Nasopharyngeal Swab     Status: None   Collection Time: 10/03/20 10:13 AM   Specimen: Nasopharyngeal Swab  Result Value Ref Range Status   SARS Coronavirus 2 NEGATIVE NEGATIVE Final    Comment: (NOTE) SARS-CoV-2 target nucleic acids are NOT DETECTED.  The SARS-CoV-2 RNA is generally detectable in upper and lower respiratory specimens during the acute phase of infection. Negative results do  not preclude SARS-CoV-2 infection, do not rule out co-infections with other pathogens, and should not be used as the sole basis for treatment or other patient management decisions. Negative results must be combined with clinical observations, patient history, and epidemiological information. The expected result is Negative.  Fact Sheet for Patients: SugarRoll.be  Fact Sheet for Healthcare Providers: https://www.woods-mathews.com/  This test is not yet approved or cleared by the [place] FDA and  has been authorized for detection and/or diagnosis of SARS-CoV-2 by FDA under an Emergency Use Authorization (EUA). This EUA will remain  in effect (meaning this test can be used) for the duration of the COVID-19 declaration under Se ction 564(b)(1) of the Act, 21 U.S.C. section 360bbb-3(b)(1), unless the authorization is terminated or revoked sooner.  Performed at [facility], [place] [address] [place], [place] [address]      Studies: No results found.  Scheduled Meds:  acetaminophen  1,000 mg Oral Q8H   calcium carbonate  400 mg of elemental calcium Oral BID   Chlorhexidine Gluconate Cloth  6 each Topical Daily   docusate sodium  100 mg Oral BID   donepezil  5 mg Oral QHS   enoxaparin (LOVENOX) injection  40 mg Subcutaneous Q24H   ferrous sulfate  325 mg Oral Q breakfast   memantine  10 mg Oral BID   pantoprazole  40 mg Oral Daily   sertraline  150 mg Oral QHS   thyroid  75 mg Oral QAC breakfast   Continuous Infusions:  methocarbamol (ROBAXIN) IV      Assessment/Plan:  Drop in hemoglobin on presentation from 12.8 down to 7.9 yesterday.  Responded to 1 unit of packed red blood cells and hemoglobin up to 8.7 today.  Continue iron.  Recommend checking a hemoglobin in 1 week Closed intertrochanteric left femur fracture secondary to fall.  Operative repair on 09/30/2020 with intramedullary nailing by orthopedic surgery.  Sutures  can be removed on 10/14/2020 Essential hypertension.  Blood pressure has come up on last blood pressure reading.  Continue to hold amlodipine. Hypothyroidism unspecified on [place] Thyroid Dementia on Aricept and Namenda Depression on Zoloft Pulse ox good on room air at rest.  With working with physical therapy did drop down a little bit.  We will continue oxygen at night at facility and as needed for pulse ox less than 88%.        Code Status:     Code Status Orders  (From admission, onward)           Start     Ordered   09/30/20 0043  Do not attempt resuscitation (DNR)  Continuous       Question Answer Comment  In the event of cardiac or respiratory ARREST Do not call a "code blue"   In the event of cardiac or respiratory ARREST Do not perform Intubation, CPR, defibrillation or ACLS   In the event of cardiac or respiratory ARREST Use medication by any route, position, wound care, and other measures to relive pain and suffering. May use oxygen, suction and manual treatment of airway obstruction as needed for comfort.      09/30/20 0042           Code Status History     Date Active Date Inactive Code Status Order ID Comments User Context   09/29/2020 2357 09/30/2020 0042 Partial Code [id number]  Marcelyn Bruins, MD ED   03/01/2019 0506 03/06/2019 2053 Partial Code [id number]  Mansy, Arvella Merles, MD ED   11/10/2016 1316 11/10/2016 1842 Full Code [id number]  Dereck Leep, MD Inpatient   07/26/2016 1739 07/26/2016 2148 Full Code [id number]  Hooten, Laurice Record, MD Inpatient      Family Communication: Spoke with daughter on the phone Disposition Plan: Status is: Inpatient  Dispo: The patient is from: Rehab              Anticipated d/c is to: Rehab              Patient currently stable to be discharged to rehab   Difficult to place patient.  No.  Consultants: Orthopedic surgery  Time spent: 31 minutes, in speaking with transitional care team and nursing staff.  Also spoke with  orthopedic team  Sonya Carey [facility]  [facility]

## 2020-10-04 NOTE — Progress Notes (Signed)
  Subjective: 4 Days Post-Op Procedure(s) (LRB): INTRAMEDULLARY (IM) NAIL INTERTROCHANTRIC (Left) Patient reports pain as mild.   Patient is well with no complaints this morning.  She is status post 1 unit of packed red blood cells 10/03/2020 Patient denies any pain at this time. Negative for chest pain and shortness of breath Fever: no Gastrointestinal:Negative for nausea and vomiting  Objective: Vital signs in last 24 hours: Temp:  [97.8 F (36.6 C)-98.6 F (37 C)] 98.5 F (36.9 C) (07/23 0738) Pulse Rate:  [72-84] 74 (07/23 0738) Resp:  [16-20] 18 (07/23 0738) BP: (96-122)/(45-59) 122/59 (07/23 0738) SpO2:  [93 %-100 %] 100 % (07/23 0738)  Intake/Output from previous day:  Intake/Output Summary (Last 24 hours) at 10/04/2020 0836 Last data filed at 10/04/2020 0545 Gross per 24 hour  Intake 780 ml  Output 825 ml  Net -45 ml    Intake/Output this shift: No intake/output data recorded.  Labs: Recent Labs    10/02/20 0445 10/03/20 0444 10/04/20 0559  HGB 8.2* 7.9* 8.7*   Recent Labs    10/03/20 0444 10/04/20 0559  WBC 8.7 7.6  RBC 2.64* 2.90*  HCT 23.5* 26.0*  PLT 166 165   No results for input(s): NA, K, CL, CO2, BUN, CREATININE, GLUCOSE, CALCIUM in the last 72 hours.  No results for input(s): LABPT, INR in the last 72 hours.   EXAM General - Patient is alert and is able to answer simple questions with me this afternoon. Extremity - ABD soft Sensation intact distally Dorsiflexion/Plantar flexion intact Incision: dressing C/D/I No cellulitis present Dressing/Incision - clean, dry, no drainage Motor Function - intact, moving foot and toes well on exam.    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([place])    adenocarcinoma of transverse colon   Depression    Fracture, metacarpal 06/2016   5th metacarpal   History of chicken pox    Hypertension    Hypothyroidism    Macular degeneration    left eye   Thyroid disease      Assessment/Plan: 4 Days Post-Op Procedure(s) (LRB): INTRAMEDULLARY (IM) NAIL INTERTROCHANTRIC (Left) Principal Problem:   Closed intertrochanteric fracture of left femur (HCC) Active Problems:   Iron deficiency anemia   Hypothyroidism   Dementia, old age, without behavioral disturbance ([place])   Essential hypertension   Closed left hip fracture, initial encounter ([place])   Depression   Drop in hemoglobin  Estimated body mass index is 26.22 kg/m as calculated from the following:   Height as of 03/01/19: '5\' 3"'$  (1.6 m).   Weight as of 03/01/19: 67.1 kg.  Continue with physical therapy, weightbearing as tolerated left lower extremity  Labs reviewed, Hg 8.7, stable. Status post 1 unit of packed red blood cells on 10/03/2020.    Will need SNF upon discharge, is currently living at [facility].  Plan for discharge to SNF today  Upon discharge patient will need Lovenog '40mg'$  daily for 14 days for DVT prophylaxis. Staple can be removed by SNF on 10/14/20.  Follow-up with [facility] in 6 weeks for x-rays of the left femur.  DVT Prophylaxis - Lovenox, Foot Pumps, and TED hose Weight-Bearing as tolerated to left leg  T. Rachelle Hora, PA-C [facility] 10/04/2020, 8:36 AM

## 2020-10-04 NOTE — TOC Progression Note (Addendum)
Transition of Care [facility]) - Progression Note    Patient Details  Name: Sonya Carey MRN: [id number] Date of Birth: 04-23-1924  [facility] CM/SW Contact  Izola Price, RN Phone Number: 10/04/2020, 9:42 AM  Clinical Narrative:  Patient is ready for discharge back to [facility] facility where had been admitted from. Arrangements made Friday and RN CM called this am to confirm with  [facility]. Left VM at [phone] and text at [phone].  Will contact daughter to discuss discharge plan today. Simmie Davies RN CM   1100 am update: Spoke with daughter and listened to concerns about discharge today. Relayed concerns to provider. Discharge stands and offer to appeal discussed with daughter again. She has decided against appeal and agrees with discharge today to [facility]. Updated DC summary with oxygen order/parameters to hub and AC messaged to update. Daughter will be here around 1 pm to explain discharge plan to patient, and then CM will contact [facility] for transport to [facility]. Simmie Davies RN CM         Expected Discharge Plan and Services           Expected Discharge Date: 10/04/20                                     Social Determinants of Health (SDOH) Interventions    Readmission Risk Interventions No flowsheet data found.

## 2020-10-06 DIAGNOSIS — D649 Anemia, unspecified: Secondary | ICD-10-CM | POA: Diagnosis not present

## 2020-10-06 DIAGNOSIS — W19XXXA Unspecified fall, initial encounter: Secondary | ICD-10-CM | POA: Diagnosis not present

## 2020-10-06 DIAGNOSIS — S72002A Fracture of unspecified part of neck of left femur, initial encounter for closed fracture: Secondary | ICD-10-CM | POA: Diagnosis not present

## 2020-10-06 DIAGNOSIS — S72142A Displaced intertrochanteric fracture of left femur, initial encounter for closed fracture: Secondary | ICD-10-CM | POA: Diagnosis not present

## 2020-11-20 DIAGNOSIS — F22 Delusional disorders: Secondary | ICD-10-CM | POA: Diagnosis not present

## 2020-11-20 DIAGNOSIS — E039 Hypothyroidism, unspecified: Secondary | ICD-10-CM | POA: Diagnosis not present

## 2020-11-20 DIAGNOSIS — F39 Unspecified mood [affective] disorder: Secondary | ICD-10-CM | POA: Diagnosis not present

## 2020-11-20 DIAGNOSIS — G301 Alzheimer's disease with late onset: Secondary | ICD-10-CM | POA: Diagnosis not present

## 2020-11-28 DIAGNOSIS — H1012 Acute atopic conjunctivitis, left eye: Secondary | ICD-10-CM | POA: Diagnosis not present

## 2020-12-26 DIAGNOSIS — F39 Unspecified mood [affective] disorder: Secondary | ICD-10-CM | POA: Diagnosis not present

## 2020-12-26 DIAGNOSIS — F22 Delusional disorders: Secondary | ICD-10-CM | POA: Diagnosis not present

## 2020-12-26 DIAGNOSIS — G309 Alzheimer's disease, unspecified: Secondary | ICD-10-CM | POA: Diagnosis not present

## 2021-01-23 DIAGNOSIS — H3530 Unspecified macular degeneration: Secondary | ICD-10-CM

## 2021-01-23 DIAGNOSIS — F22 Delusional disorders: Secondary | ICD-10-CM | POA: Diagnosis not present

## 2021-01-23 DIAGNOSIS — M810 Age-related osteoporosis without current pathological fracture: Secondary | ICD-10-CM

## 2021-01-23 DIAGNOSIS — E43 Unspecified severe protein-calorie malnutrition: Secondary | ICD-10-CM

## 2021-01-23 DIAGNOSIS — E039 Hypothyroidism, unspecified: Secondary | ICD-10-CM | POA: Diagnosis not present

## 2021-01-23 DIAGNOSIS — G309 Alzheimer's disease, unspecified: Secondary | ICD-10-CM | POA: Diagnosis not present

## 2021-01-23 DIAGNOSIS — F39 Unspecified mood [affective] disorder: Secondary | ICD-10-CM | POA: Diagnosis not present

## 2021-01-23 DIAGNOSIS — M1990 Unspecified osteoarthritis, unspecified site: Secondary | ICD-10-CM

## 2021-01-23 DIAGNOSIS — D649 Anemia, unspecified: Secondary | ICD-10-CM

## 2021-02-19 DIAGNOSIS — S00412A Abrasion of left ear, initial encounter: Secondary | ICD-10-CM | POA: Diagnosis not present

## 2021-02-20 DIAGNOSIS — H60322 Hemorrhagic otitis externa, left ear: Secondary | ICD-10-CM

## 2021-03-02 DIAGNOSIS — M7501 Adhesive capsulitis of right shoulder: Secondary | ICD-10-CM | POA: Diagnosis not present

## 2021-04-02 DIAGNOSIS — E038 Other specified hypothyroidism: Secondary | ICD-10-CM

## 2021-04-02 DIAGNOSIS — G301 Alzheimer's disease with late onset: Secondary | ICD-10-CM | POA: Diagnosis not present

## 2021-04-02 DIAGNOSIS — F22 Delusional disorders: Secondary | ICD-10-CM | POA: Diagnosis not present

## 2021-04-02 DIAGNOSIS — F39 Unspecified mood [affective] disorder: Secondary | ICD-10-CM | POA: Diagnosis not present

## 2021-04-02 DIAGNOSIS — M810 Age-related osteoporosis without current pathological fracture: Secondary | ICD-10-CM

## 2021-04-02 DIAGNOSIS — H353293 Exudative age-related macular degeneration, unspecified eye, with inactive scar: Secondary | ICD-10-CM | POA: Diagnosis not present

## 2021-04-03 ENCOUNTER — Emergency Department: Payer: Medicare Other

## 2021-04-03 ENCOUNTER — Other Ambulatory Visit: Payer: Self-pay

## 2021-04-03 ENCOUNTER — Inpatient Hospital Stay
Admission: EM | Admit: 2021-04-03 | Discharge: 2021-04-06 | DRG: 315 | Disposition: A | Discharge status: Skilled Nursing Facility | Payer: Medicare Other | Source: Skilled Nursing Facility | Attending: Hospitalist | Admitting: Hospitalist

## 2021-04-03 DIAGNOSIS — F32A Depression, unspecified: Secondary | ICD-10-CM | POA: Diagnosis present

## 2021-04-03 DIAGNOSIS — Z85038 Personal history of other malignant neoplasm of large intestine: Secondary | ICD-10-CM

## 2021-04-03 DIAGNOSIS — Z20822 Contact with and (suspected) exposure to covid-19: Secondary | ICD-10-CM | POA: Diagnosis present

## 2021-04-03 DIAGNOSIS — E039 Hypothyroidism, unspecified: Secondary | ICD-10-CM | POA: Diagnosis present

## 2021-04-03 DIAGNOSIS — A419 Sepsis, unspecified organism: Secondary | ICD-10-CM | POA: Diagnosis not present

## 2021-04-03 DIAGNOSIS — R112 Nausea with vomiting, unspecified: Secondary | ICD-10-CM

## 2021-04-03 DIAGNOSIS — Z7983 Long term (current) use of bisphosphonates: Secondary | ICD-10-CM

## 2021-04-03 DIAGNOSIS — N179 Acute kidney failure, unspecified: Secondary | ICD-10-CM | POA: Diagnosis present

## 2021-04-03 DIAGNOSIS — Z91040 Latex allergy status: Secondary | ICD-10-CM | POA: Diagnosis not present

## 2021-04-03 DIAGNOSIS — N170 Acute kidney failure with tubular necrosis: Secondary | ICD-10-CM

## 2021-04-03 DIAGNOSIS — Y92129 Unspecified place in nursing home as the place of occurrence of the external cause: Secondary | ICD-10-CM

## 2021-04-03 DIAGNOSIS — J9811 Atelectasis: Secondary | ICD-10-CM | POA: Diagnosis present

## 2021-04-03 DIAGNOSIS — E860 Dehydration: Secondary | ICD-10-CM | POA: Diagnosis present

## 2021-04-03 DIAGNOSIS — H3530 Unspecified macular degeneration: Secondary | ICD-10-CM | POA: Diagnosis present

## 2021-04-03 DIAGNOSIS — Z91048 Other nonmedicinal substance allergy status: Secondary | ICD-10-CM

## 2021-04-03 DIAGNOSIS — Z66 Do not resuscitate: Secondary | ICD-10-CM | POA: Diagnosis present

## 2021-04-03 DIAGNOSIS — F02C3 Dementia in other diseases classified elsewhere, severe, with mood disturbance: Secondary | ICD-10-CM | POA: Diagnosis present

## 2021-04-03 DIAGNOSIS — D62 Acute posthemorrhagic anemia: Secondary | ICD-10-CM | POA: Diagnosis present

## 2021-04-03 DIAGNOSIS — X58XXXA Exposure to other specified factors, initial encounter: Secondary | ICD-10-CM | POA: Diagnosis present

## 2021-04-03 DIAGNOSIS — G309 Alzheimer's disease, unspecified: Secondary | ICD-10-CM | POA: Diagnosis present

## 2021-04-03 DIAGNOSIS — R58 Hemorrhage, not elsewhere classified: Secondary | ICD-10-CM | POA: Diagnosis present

## 2021-04-03 DIAGNOSIS — E872 Acidosis, unspecified: Secondary | ICD-10-CM | POA: Diagnosis present

## 2021-04-03 DIAGNOSIS — F0280 Dementia in other diseases classified elsewhere without behavioral disturbance: Secondary | ICD-10-CM | POA: Diagnosis present

## 2021-04-03 DIAGNOSIS — I959 Hypotension, unspecified: Secondary | ICD-10-CM | POA: Diagnosis present

## 2021-04-03 DIAGNOSIS — F0390 Unspecified dementia without behavioral disturbance: Secondary | ICD-10-CM

## 2021-04-03 DIAGNOSIS — R519 Headache, unspecified: Secondary | ICD-10-CM

## 2021-04-03 DIAGNOSIS — S01312A Laceration without foreign body of left ear, initial encounter: Secondary | ICD-10-CM | POA: Diagnosis present

## 2021-04-03 DIAGNOSIS — Z87891 Personal history of nicotine dependence: Secondary | ICD-10-CM

## 2021-04-03 DIAGNOSIS — Z886 Allergy status to analgesic agent status: Secondary | ICD-10-CM

## 2021-04-03 DIAGNOSIS — Z888 Allergy status to other drugs, medicaments and biological substances status: Secondary | ICD-10-CM

## 2021-04-03 DIAGNOSIS — Z79899 Other long term (current) drug therapy: Secondary | ICD-10-CM | POA: Diagnosis not present

## 2021-04-03 DIAGNOSIS — R6520 Severe sepsis without septic shock: Secondary | ICD-10-CM

## 2021-04-03 DIAGNOSIS — I10 Essential (primary) hypertension: Secondary | ICD-10-CM | POA: Diagnosis present

## 2021-04-03 LAB — CBC WITH DIFFERENTIAL/PLATELET
Abs Immature Granulocytes: 0.21 10*3/uL — ABNORMAL HIGH (ref 0.00–0.07)
Basophils Absolute: 0.1 10*3/uL (ref 0.0–0.1)
Basophils Relative: 0 %
Eosinophils Absolute: 0.1 10*3/uL (ref 0.0–0.5)
Eosinophils Relative: 0 %
HCT: 33.4 % — ABNORMAL LOW (ref 36.0–46.0)
Hemoglobin: 11.1 g/dL — ABNORMAL LOW (ref 12.0–15.0)
Immature Granulocytes: 1 %
Lymphocytes Relative: 20 %
Lymphs Abs: 3.4 10*3/uL (ref 0.7–4.0)
MCH: 29.8 pg (ref 26.0–34.0)
MCHC: 33.2 g/dL (ref 30.0–36.0)
MCV: 89.8 fL (ref 80.0–100.0)
Monocytes Absolute: 1.1 10*3/uL — ABNORMAL HIGH (ref 0.1–1.0)
Monocytes Relative: 6 %
Neutro Abs: 12.1 10*3/uL — ABNORMAL HIGH (ref 1.7–7.7)
Neutrophils Relative %: 73 %
Platelets: 382 10*3/uL (ref 150–400)
RBC: 3.72 MIL/uL — ABNORMAL LOW (ref 3.87–5.11)
RDW: 13.7 % (ref 11.5–15.5)
WBC: 16.9 10*3/uL — ABNORMAL HIGH (ref 4.0–10.5)
nRBC: 0 % (ref 0.0–0.2)

## 2021-04-03 LAB — URINALYSIS, COMPLETE (UACMP) WITH MICROSCOPIC
Bilirubin Urine: NEGATIVE
Glucose, UA: NEGATIVE mg/dL
Hgb urine dipstick: NEGATIVE
Ketones, ur: NEGATIVE mg/dL
Leukocytes,Ua: NEGATIVE
Nitrite: NEGATIVE
Protein, ur: NEGATIVE mg/dL
Specific Gravity, Urine: 1.01 (ref 1.005–1.030)
Squamous Epithelial / HPF: NONE SEEN (ref 0–5)
pH: 5 (ref 5.0–8.0)

## 2021-04-03 LAB — APTT: aPTT: 26 seconds (ref 24–36)

## 2021-04-03 LAB — COMPREHENSIVE METABOLIC PANEL
ALT: 11 U/L (ref 0–44)
AST: 25 U/L (ref 15–41)
Albumin: 3.2 g/dL — ABNORMAL LOW (ref 3.5–5.0)
Alkaline Phosphatase: 92 U/L (ref 38–126)
Anion gap: 8 (ref 5–15)
BUN: 15 mg/dL (ref 8–23)
CO2: 23 mmol/L (ref 22–32)
Calcium: 8.4 mg/dL — ABNORMAL LOW (ref 8.9–10.3)
Chloride: 99 mmol/L (ref 98–111)
Creatinine, Ser: 1.2 mg/dL — ABNORMAL HIGH (ref 0.44–1.00)
GFR, Estimated: 42 mL/min — ABNORMAL LOW (ref >60)
Glucose, Bld: 205 mg/dL — ABNORMAL HIGH (ref 70–99)
Potassium: 3.6 mmol/L (ref 3.5–5.1)
Sodium: 130 mmol/L — ABNORMAL LOW (ref 135–145)
Total Bilirubin: 0.5 mg/dL (ref 0.3–1.2)
Total Protein: 6.8 g/dL (ref 6.5–8.1)

## 2021-04-03 LAB — HEMOGLOBIN AND HEMATOCRIT, BLOOD
HCT: 28 % — ABNORMAL LOW (ref 36.0–46.0)
Hemoglobin: 9.6 g/dL — ABNORMAL LOW (ref 12.0–15.0)

## 2021-04-03 LAB — BASIC METABOLIC PANEL
Anion gap: 8 (ref 5–15)
BUN: 16 mg/dL (ref 8–23)
CO2: 24 mmol/L (ref 22–32)
Calcium: 8.7 mg/dL — ABNORMAL LOW (ref 8.9–10.3)
Chloride: 99 mmol/L (ref 98–111)
Creatinine, Ser: 1.36 mg/dL — ABNORMAL HIGH (ref 0.44–1.00)
GFR, Estimated: 36 mL/min — ABNORMAL LOW (ref >60)
Glucose, Bld: 141 mg/dL — ABNORMAL HIGH (ref 70–99)
Potassium: 4.2 mmol/L (ref 3.5–5.1)
Sodium: 131 mmol/L — ABNORMAL LOW (ref 135–145)

## 2021-04-03 LAB — RESP PANEL BY RT-PCR (FLU A&B, COVID) ARPGX2
Influenza A by PCR: NEGATIVE
Influenza B by PCR: NEGATIVE
SARS Coronavirus 2 by RT PCR: NEGATIVE

## 2021-04-03 LAB — PROTIME-INR
INR: 1.1 (ref 0.8–1.2)
Prothrombin Time: 13.9 seconds (ref 11.4–15.2)

## 2021-04-03 LAB — LACTIC ACID, PLASMA
Lactic Acid, Venous: 4.1 mmol/L (ref 0.5–1.9)
Lactic Acid, Venous: 2.9 mmol/L (ref 0.5–1.9)

## 2021-04-03 MED ORDER — CEFTRIAXONE SODIUM 1 G IJ SOLR
1.0000 g | Freq: Once | INTRAMUSCULAR | Status: AC
  Administered 2021-04-03: 1 g via INTRAVENOUS
  Filled 2021-04-03: qty 10

## 2021-04-03 MED ORDER — ONDANSETRON HCL 4 MG/2ML IJ SOLN: 4.0000 mg | Freq: Four times a day (QID) | INTRAMUSCULAR | Status: DC | PRN

## 2021-04-03 MED ORDER — SERTRALINE HCL 50 MG PO TABS
150.0000 mg | ORAL_TABLET | Freq: Every day | ORAL | Status: DC
  Administered 2021-04-03 – 2021-04-05 (×3): 150 mg via ORAL
  Filled 2021-04-03 (×3): qty 3

## 2021-04-03 MED ORDER — SODIUM CHLORIDE 0.9 % IV SOLN
500.0000 mg | Freq: Once | INTRAVENOUS | Status: AC
  Administered 2021-04-03: 500 mg via INTRAVENOUS
  Filled 2021-04-03: qty 5

## 2021-04-03 MED ORDER — LACTATED RINGERS IV BOLUS
1000.0000 mL | Freq: Once | INTRAVENOUS | Status: AC
  Administered 2021-04-03: 1000 mL via INTRAVENOUS

## 2021-04-03 MED ORDER — FERROUS SULFATE 325 (65 FE) MG PO TABS
325.0000 mg | ORAL_TABLET | Freq: Every day | ORAL | Status: DC
  Administered 2021-04-04 – 2021-04-06 (×3): 325 mg via ORAL
  Filled 2021-04-03 (×3): qty 1

## 2021-04-03 MED ORDER — THYROID 30 MG PO TABS
15.0000 mg | ORAL_TABLET | Freq: Every morning | ORAL | Status: DC
  Administered 2021-04-04 – 2021-04-06 (×3): 15 mg via ORAL
  Filled 2021-04-03 (×5): qty 1

## 2021-04-03 MED ORDER — LORATADINE 10 MG PO TABS
10.0000 mg | ORAL_TABLET | Freq: Every day | ORAL | Status: DC
  Administered 2021-04-03 – 2021-04-06 (×4): 10 mg via ORAL
  Filled 2021-04-03 (×4): qty 1

## 2021-04-03 MED ORDER — ONDANSETRON HCL 4 MG/2ML IJ SOLN
4.0000 mg | INTRAMUSCULAR | Status: AC
  Administered 2021-04-03: 4 mg via INTRAVENOUS
  Filled 2021-04-03: qty 2

## 2021-04-03 MED ORDER — MAGNESIUM HYDROXIDE 400 MG/5ML PO SUSP: 30.0000 mL | Freq: Every day | ORAL | Status: DC | PRN

## 2021-04-03 MED ORDER — SODIUM CHLORIDE 0.9 % IV SOLN
500.0000 mg | INTRAVENOUS | Status: DC
  Administered 2021-04-04: 500 mg via INTRAVENOUS
  Filled 2021-04-03: qty 500

## 2021-04-03 MED ORDER — ENOXAPARIN SODIUM 30 MG/0.3ML IJ SOSY: 30.0000 mg | PREFILLED_SYRINGE | INTRAMUSCULAR | Status: DC

## 2021-04-03 MED ORDER — SODIUM CHLORIDE 0.9 % IV SOLN
1.0000 g | INTRAVENOUS | Status: DC
  Administered 2021-04-04: 1 g via INTRAVENOUS
  Filled 2021-04-03: qty 1

## 2021-04-03 MED ORDER — LACTATED RINGERS IV SOLN: INTRAVENOUS | Status: DC

## 2021-04-03 MED ORDER — THYROID 60 MG PO TABS
60.0000 mg | ORAL_TABLET | Freq: Every morning | ORAL | Status: DC
  Administered 2021-04-04 – 2021-04-06 (×3): 60 mg via ORAL
  Filled 2021-04-03 (×5): qty 1

## 2021-04-03 MED ORDER — MEMANTINE HCL 5 MG PO TABS
10.0000 mg | ORAL_TABLET | Freq: Two times a day (BID) | ORAL | Status: DC
  Administered 2021-04-03 – 2021-04-06 (×7): 10 mg via ORAL
  Filled 2021-04-03 (×7): qty 2

## 2021-04-03 MED ORDER — OLOPATADINE HCL 0.1 % OP SOLN
1.0000 [drp] | Freq: Every day | OPHTHALMIC | Status: DC
  Administered 2021-04-03 – 2021-04-06 (×4): 1 [drp] via OPHTHALMIC
  Filled 2021-04-03: qty 5

## 2021-04-03 MED ORDER — CALCIUM CARBONATE ANTACID 500 MG PO CHEW
400.0000 mg | CHEWABLE_TABLET | Freq: Two times a day (BID) | ORAL | Status: DC
  Administered 2021-04-03 – 2021-04-06 (×7): 400 mg via ORAL
  Filled 2021-04-03 (×7): qty 2

## 2021-04-03 MED ORDER — LIDOCAINE 5 % EX PTCH
1.0000 | MEDICATED_PATCH | Freq: Every day | CUTANEOUS | Status: DC
  Administered 2021-04-05 – 2021-04-06 (×2): 1 via TRANSDERMAL
  Filled 2021-04-03 (×4): qty 1

## 2021-04-03 MED ORDER — ACETAMINOPHEN 650 MG RE SUPP: 650.0000 mg | Freq: Four times a day (QID) | RECTAL | Status: DC | PRN

## 2021-04-03 MED ORDER — ONDANSETRON HCL 4 MG PO TABS
4.0000 mg | ORAL_TABLET | Freq: Four times a day (QID) | ORAL | Status: DC | PRN
  Filled 2021-04-03: qty 1

## 2021-04-03 MED ORDER — DOCUSATE SODIUM 100 MG PO CAPS
100.0000 mg | ORAL_CAPSULE | Freq: Two times a day (BID) | ORAL | Status: DC | PRN
  Administered 2021-04-06: 100 mg via ORAL
  Filled 2021-04-03: qty 1

## 2021-04-03 MED ORDER — ACETAMINOPHEN 500 MG PO TABS
1000.0000 mg | ORAL_TABLET | Freq: Once | ORAL | Status: AC
  Administered 2021-04-03: 1000 mg via ORAL
  Filled 2021-04-03: qty 2

## 2021-04-03 MED ORDER — DONEPEZIL HCL 5 MG PO TABS
10.0000 mg | ORAL_TABLET | Freq: Every day | ORAL | Status: DC
  Administered 2021-04-03 – 2021-04-05 (×3): 10 mg via ORAL
  Filled 2021-04-03 (×3): qty 2

## 2021-04-03 MED ORDER — ACETAMINOPHEN 325 MG PO TABS
650.0000 mg | ORAL_TABLET | Freq: Four times a day (QID) | ORAL | Status: DC | PRN
  Filled 2021-04-03: qty 2

## 2021-04-03 MED ORDER — LACTASE 3000 UNITS PO TABS
6000.0000 [IU] | ORAL_TABLET | Freq: Three times a day (TID) | ORAL | Status: DC
  Administered 2021-04-03 – 2021-04-06 (×8): 6000 [IU] via ORAL
  Filled 2021-04-03 (×13): qty 2

## 2021-04-03 MED ORDER — ADULT MULTIVITAMIN W/MINERALS CH
1.0000 | ORAL_TABLET | Freq: Every day | ORAL | Status: DC
  Administered 2021-04-03 – 2021-04-06 (×4): 1 via ORAL
  Filled 2021-04-03 (×4): qty 1

## 2021-04-03 MED ORDER — POLYVINYL ALCOHOL 1.4 % OP SOLN
1.0000 [drp] | Freq: Two times a day (BID) | OPHTHALMIC | Status: DC | PRN
  Filled 2021-04-03: qty 15

## 2021-04-03 MED ORDER — OCUVITE-LUTEIN PO CAPS
1.0000 | ORAL_CAPSULE | Freq: Every day | ORAL | Status: DC
  Administered 2021-04-03 – 2021-04-05 (×3): 1 via ORAL
  Filled 2021-04-03 (×5): qty 1

## 2021-04-03 MED ORDER — VITAMIN D3 25 MCG (1000 UNIT) PO TABS
1000.0000 [IU] | ORAL_TABLET | Freq: Every day | ORAL | Status: DC
  Administered 2021-04-03 – 2021-04-06 (×4): 1000 [IU] via ORAL
  Filled 2021-04-03 (×8): qty 1

## 2021-04-03 NOTE — Evaluation (Addendum)
Clinical/Bedside Swallow Evaluation Patient Details  Name: Sonya Carey MRN: [id number] Date of Birth: September 12, 1924  Today's Date: 04/03/2021 Time: SLP Start Time (ACUTE ONLY): 1330 SLP Stop Time (ACUTE ONLY): 1430 SLP Time Calculation (min) (ACUTE ONLY): 60 min  Past Medical History:  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([employer])    adenocarcinoma of transverse colon   Depression    Fracture, metacarpal 06/2016   5th metacarpal   History of chicken pox    Hypertension    Hypothyroidism    Macular degeneration    left eye   Thyroid disease    Past Surgical History:  Past Surgical History:  Procedure Laterality Date   CARPAL TUNNEL RELEASE  12/09/2010   CHOLECYSTECTOMY  1995   COLON SURGERY  02/25/2010   transverse colon resection   COLONOSCOPY     ESOPHAGOGASTRODUODENOSCOPY  02/25/2010   GANGLION CYST EXCISION Right 07/26/2016   Procedure: REMOVAL GANGLION OF WRIST;  Surgeon: Dereck Leep, MD;  Location: [facility] ORS;  Service: Orthopedics;  Laterality: Right;   GANGLION CYST EXCISION Right 11/10/2016   Procedure: REMOVAL GANGLION OF WRIST;  Surgeon: Dereck Leep, MD;  Location: [facility] ORS;  Service: Orthopedics;  Laterality: Right;   HERNIA REPAIR     INTRAMEDULLARY (IM) NAIL INTERTROCHANTERIC Left 09/30/2020   Procedure: INTRAMEDULLARY (IM) NAIL INTERTROCHANTRIC;  Surgeon: Leim Fabry, MD;  Location: [facility] ORS;  Service: Orthopedics;  Laterality: Left;   MULTIPLE TOOTH EXTRACTIONS  06/2015   for dentures   UMBILICAL HERNIA REPAIR  02/25/2010   HPI:  Pt is a 88 y.o. female with medical history significant for hypothyroidism, hypertension, depression, HOH, and Dementia who was brought into the ER by EMS for evaluation after she was found by the nursing home staff to have a large amount of blood around her head and neck.  Per EMS they estimated about 1 L blood loss and patient was found to have a small abrasion/laceration to her left ear.   She complains of  nausea and a headache.  Patient denies having any ear pain and denies any recent trauma.  She was hypothermic with a T-max of 94 but normal rectal temp and hypotensive when she arrived in the ER.  She has leukocytosis and lactic acidosis that shows a downward trend.  She also has worsening of her renal function from baseline 0.68 >> 1.3.  Chest x-ray shows a right lower lobe infiltrate.  She received IV antibiotic therapy and will be admitted to the hospital for further evaluation.    Assessment / Plan / Recommendation  Clinical Impression  Pt appears to present w/ adequate oropharyngeal phase swallow  function w/ No overt oropharyngeal phase dysphagia noted, No neuromuscular deficits noted. Pt consumed po trials w/ No overt, clinical s/s of aspiration during po trials. ANY Cognitive decline can impact overall awareness/timing of swallow and safety during po tasks which increases risk for aspiration, choking. Pt's risk for aspiration is reduced when following general aspiration precautions, when given setup and support at meals, and when using a Pureed diet consistency currently d/t lacking her Dentures.   During po trials, pt consumed thin liquids and pureed consistencies w/ no overt coughing, decline in vocal quality, or change in respiratory presentation during/post trials. O2 sats remained 97-98%. Oral phase appeared [facility] w/ timely bolus management and control of bolus propulsion for A-P transfer for swallowing. Oral clearing achieved w/ all trial consistencies. OM Exam appeared [place] w/ no unilateral weakness noted. Speech Clear.  Pt fed self w/ MOD setup and support; guidance.   Recommend a Pureed consistency diet w/ gravies to moisten foods d/t lacking her Dentures; Thin liquids - monitor straw use. Recommend general aspiration precautions, support at meals w/ setup and feeding as needed d/t Cognitive decline. Pills WHOLE vs Crushed in Puree for safer, easier swallowing d/t Cognitive decline. Education  given on Pills in Puree; food consistencies and the fact that pt is lacking her Dentures; general aspiration precautions to [place]. NSG informed that pt needs her Dentures for any diet upgrade -- should be able to resume previous Baseline diet(from her living facility) when wearing her Dentures(NSG to monitor). Dietician f/u if needed for support. NSG to reconsult if any new needs arise. NSG agreed. MD updated. Precautions posted. SLP Visit Diagnosis: Dysphagia, unspecified (R13.10) (not wearing her Dentures currently; baseline Dementia)    Aspiration Risk  Mild aspiration risk;Risk for inadequate nutrition/hydration (reduced following general precautions; given support)    Diet Recommendation   Pureed consistency diet w/ gravies to moisten foods d/t lacking her Dentures; Thin liquids - monitor straw use. Recommend general aspiration precautions, support at meals w/ setup and feeding as needed d/t Cognitive decline.  Medication Administration: Whole meds with puree (vs need to Crush in puree)    Other  Recommendations Recommended Consults:  (dietician f/u if needed) Oral Care Recommendations: Oral care BID;Oral care before and after PO;Staff/trained caregiver to provide oral care Other Recommendations:  (n/a)    Recommendations for follow up therapy are one component of a multi-disciplinary discharge planning process, led by the attending physician.  Recommendations may be updated based on patient status, additional functional criteria and insurance authorization.  Follow up Recommendations No SLP follow up      Assistance Recommended at Discharge Intermittent Supervision/Assistance  Functional Status Assessment Patient has had a recent decline in their functional status and/or demonstrates limited ability to make significant improvements in function in a reasonable and predictable amount of time  Frequency and Duration  (n/a)   (n/a)       Prognosis Prognosis for Safe Diet Advancement: Fair  (-Good) Barriers to Reach Goals: Cognitive deficits;Time post onset;Severity of deficits Barriers/Prognosis Comment: Lacking Dentures currently      Swallow Study   General Date of Onset: 04/03/21 HPI: Pt is a 88 y.o. female with medical history significant for hypothyroidism, hypertension, depression, HOH, and Dementia who was brought into the ER by EMS for evaluation after she was found by the nursing home staff to have a large amount of blood around her head and neck.  Per EMS they estimated about 1 L blood loss and patient was found to have a small abrasion/laceration to her left ear.   She complains of nausea and a headache.  Patient denies having any ear pain and denies any recent trauma.  She was hypothermic with a T-max of 94 but normal rectal temp and hypotensive when she arrived in the ER.  She has leukocytosis and lactic acidosis that shows a downward trend.  She also has worsening of her renal function from baseline 0.68 >> 1.3.  Chest x-ray shows a right lower lobe infiltrate.  She received IV antibiotic therapy and will be admitted to the hospital for further evaluation. Type of Study: Bedside Swallow Evaluation Previous Swallow Assessment: none Diet Prior to this Study: NPO Temperature Spikes Noted: No (wbc elevated) Respiratory Status: Room air History of Recent Intubation: No Behavior/Cognition: Alert;Cooperative;Pleasant mood;Confused;Distractible;Requires cueing (HOH) Oral Cavity Assessment: Within Functional Limits Oral Care  Completed by SLP: Yes Oral Cavity - Dentition: Edentulous (stated she wears dentures at "home") Vision: Functional for self-feeding Self-Feeding Abilities: Able to feed self;Needs assist;Needs set up (guidance) Patient Positioning: Upright in bed (needed positioning support) Baseline Vocal Quality: Normal Volitional Cough: Strong Volitional Swallow: Able to elicit    Oral/Motor/Sensory Function Overall Oral Motor/Sensory Function: Within functional  limits   Ice Chips Ice chips: Within functional limits Presentation: Spoon (fed; 3 trials)   Thin Liquid Thin Liquid: Within functional limits Presentation: Cup;Self Fed;Straw (6 trials via each; had been drinking water per cup in room)    Nectar Thick Nectar Thick Liquid: Not tested   Honey Thick Honey Thick Liquid: Not tested   Puree Puree: Within functional limits Presentation: Spoon;Self Fed (supported/assisted; 10 trials)   Solid     Solid: Not tested Other Comments: edentulous and pt stated she wears dentures when she eats her meals at the living facility       Orinda Kenner, [place], [place]; [place] [phone] (ascom)  Ewell Benassi 04/03/2021,4:43 PM

## 2021-04-03 NOTE — H&P (Signed)
History and Physical    Sonya Carey NTI:[id number] DOB: Mar 30, 1924 DOA: 04/03/2021  PCP: Venia Carbon, MD   Patient coming from: [facility]  I have personally briefly reviewed patient's old medical records in [place]  Chief Complaint: Bleeding  HPI: Sonya Carey is a 88 y.o. female with medical history significant for hypothyroidism, hypertension, depression and dementia who was brought into the ER by EMS for evaluation after she was found by the nursing home staff to have a large amount of blood around her head and neck. Per EMS they estimated about 1 L blood loss and patient was found to have a small abrasion/laceration to her left ear.  She complains of nausea and a headache. Patient denies having any ear pain and denies any recent trauma.  She denies having any chest pain, no shortness of breath, no abdominal pain, no nausea, no vomiting, no changes in her bowel habits or any urinary symptoms. Sodium 131, potassium 4.2, chloride 99, bicarb 24, glucose 141, BUN 16, creatinine 1.36 compared to baseline of 0.68, calcium 8.7, lactic acid 4.1 >> 2.9, white count 16.9, hemoglobin 11.1, hematocrit 33.4, MCV 89.8, RDW 13.7, platelet count 382, PT 13.9, INR 1.1 Respiratory viral panel is negative Urinalysis is sterile Chest x-ray reviewed by me shows a right base opacity which may reflect atelectasis or infiltrate.  Small right pleural effusion. Twelve-lead EKG reviewed by me shows sinus rhythm.   ED Course: Patient is a 88 year old female who was sent to the ER for evaluation from the skilled nursing facility where she resides after she was found in a pool of blood. Source of bleeding appeared to be from a laceration involving the left ear. She was hypothermic with a T-max of 94 but normal rectal temp and hypotensive when she arrived in the ER. She has leukocytosis and lactic acidosis that shows a downward trend. She also has worsening of her renal function from  baseline 0.68 >> 1.3. Chest x-ray shows a right lower lobe infiltrate. She received IV antibiotic therapy and will be admitted to the hospital for further evaluation.   Review of Systems: As per HPI otherwise all other systems reviewed and negative.    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[facility])    adenocarcinoma of transverse colon   Depression    Fracture, metacarpal 06/2016   5th metacarpal   History of chicken pox    Hypertension    Hypothyroidism    Macular degeneration    left eye   Thyroid disease     Past Surgical History:  Procedure Laterality Date   CARPAL TUNNEL RELEASE  12/09/2010   CHOLECYSTECTOMY  1995   COLON SURGERY  02/25/2010   transverse colon resection   COLONOSCOPY     ESOPHAGOGASTRODUODENOSCOPY  02/25/2010   GANGLION CYST EXCISION Right 07/26/2016   Procedure: REMOVAL GANGLION OF WRIST;  Surgeon: Dereck Leep, MD;  Location: [facility] ORS;  Service: Orthopedics;  Laterality: Right;   GANGLION CYST EXCISION Right 11/10/2016   Procedure: REMOVAL GANGLION OF WRIST;  Surgeon: Dereck Leep, MD;  Location: [facility] ORS;  Service: Orthopedics;  Laterality: Right;   HERNIA REPAIR     INTRAMEDULLARY (IM) NAIL INTERTROCHANTERIC Left 09/30/2020   Procedure: INTRAMEDULLARY (IM) NAIL INTERTROCHANTRIC;  Surgeon: Leim Fabry, MD;  Location: [facility] ORS;  Service: Orthopedics;  Laterality: Left;   MULTIPLE TOOTH EXTRACTIONS  06/2015   for dentures   UMBILICAL HERNIA REPAIR  02/25/2010  reports that she has quit smoking. She has never used smokeless tobacco. She reports that she does not drink alcohol and does not use drugs.  Allergies  Allergen Reactions   Celebrex [Celecoxib] Other (See Comments)    GI upset   Latex Itching    NEGATIVE LATEX IgE (< 0.10) 11/04/2016   Statins Other (See Comments)    Muscle pain   Adhesive [Tape] Rash    Family History  Problem Relation Age of Onset   Dementia Mother       Prior to Admission medications    Medication Sig Start Date End Date Taking? Authorizing Provider  acetaminophen (TYLENOL) 325 MG tablet Take 650 mg by mouth 3 (three) times daily.   Yes [provider]  calcium carbonate (TUMS - DOSED IN MG ELEMENTAL CALCIUM) 500 MG chewable tablet Chew 2 tablets (400 mg of elemental calcium total) by mouth 2 (two) times daily. 10/03/20  Yes Wieting, Richard, MD  cholecalciferol (VITAMIN D) 1000 units tablet Take 1,000 Units by mouth daily.   Yes [provider]  diclofenac Sodium (VOLTAREN) 1 % GEL Apply 2 g topically every 8 (eight) hours as needed.   Yes [provider]  docusate sodium (COLACE) 100 MG capsule Take 1 capsule (100 mg total) by mouth 2 (two) times daily as needed for mild constipation. 03/06/19  Yes Lorella Nimrod, MD  donepezil (ARICEPT) 10 MG tablet Take 10 mg by mouth at bedtime.    Yes [provider]  ferrous sulfate 325 (65 FE) MG tablet Take 1 tablet (325 mg total) by mouth daily with breakfast. 10/04/20  Yes Wieting, Richard, MD  HYDROcodone-acetaminophen (NORCO/VICODIN) 5-325 MG tablet Take 0.5 tablets by mouth daily.   Yes [provider]  lactase (LACTAID) 3000 units tablet Take 6,000 Units by mouth 3 (three) times daily with meals.   Yes [provider]  Lidocaine 4 % PTCH Apply 1 patch topically daily.   Yes [provider]  memantine (NAMENDA) 10 MG tablet Take 10 mg by mouth 2 (two) times daily. 09/01/20  Yes [provider]  Multiple Vitamin (MULTIVITAMIN ADULT) TABS Take 1 tablet by mouth daily. SHAKLEE "LIFE STRIP" MULTIVITAMIN PACK 10/03/20  Yes Wieting, Richard, MD  Multiple Vitamins-Minerals (PRESERVISION AREDS 2) CAPS Take 1 tablet by mouth in the morning and at bedtime.   Yes [provider]  NP THYROID 15 MG tablet Take 15 mg by mouth every morning. 09/05/20  Yes [provider]  NP THYROID 60 MG tablet Take 60 mg by mouth in the morning. 09/05/20  Yes [provider]  olopatadine (PATANOL) 0.1 % ophthalmic solution Place 1 drop into the left eye daily.   Yes [provider]  sertraline (ZOLOFT) 100 MG tablet Take 150 mg by mouth at bedtime.   Yes [provider]  alendronate (FOSAMAX) 70 MG tablet Take 1 tablet (70 mg total) by mouth once a week. Take with a full glass of water on an empty stomach. Patient taking differently: Take 70 mg by mouth once a week. Take with a full glass of water on an empty stomach every Sunday. 10/03/20   Loletha Grayer, MD  cetirizine (ZYRTEC) 10 MG tablet Take 10 mg by mouth daily.    [provider]  enoxaparin (LOVENOX) 40 MG/0.4ML injection Inject 0.4 mLs (40 mg total) into the skin daily. Patient not taking: Reported on 04/03/2021 10/03/20   Lattie Corns, PA-C  hydroxypropyl methylcellulose / hypromellose (ISOPTO TEARS / GONIOVISC)  2.5 % ophthalmic solution Place 1 drop into both eyes 2 (two) times daily as needed for dry eyes.    [provider]  magnesium hydroxide (MILK OF MAGNESIA) 400 MG/5ML suspension Take 30 mLs by mouth daily as needed for mild constipation. 03/06/19   Lorella Nimrod, MD  oxyCODONE (OXY IR/ROXICODONE) 5 MG immediate release tablet Take 1-2 tablets (5-10 mg total) by mouth every 4 (four) hours as needed for severe pain (pain score 7-10). Patient not taking: Reported on 04/03/2021 10/02/20   Lattie Corns, PA-C  pantoprazole (PROTONIX) 40 MG tablet Take 1 tablet (40 mg total) by mouth daily. Patient not taking: Reported on 04/03/2021 10/04/20   Loletha Grayer, MD    Physical Exam: Vitals:   04/03/21 0800 04/03/21 0830 04/03/21 0845 04/03/21 0945  BP: 119/90 (!) 99/57 106/64 (!) 92/53  Pulse: 99 98 89 88  Resp: 18 (!) 23 13 13   Temp:      TempSrc:      SpO2: 96% 95% 96% 97%  Weight:      Height:         Vitals:   04/03/21 0800 04/03/21 0830 04/03/21 0845 04/03/21 0945  BP: 119/90 (!) 99/57 106/64 (!) 92/53  Pulse: 99 98 89 88  Resp: 18 (!) 23  13 13   Temp:      TempSrc:      SpO2: 96% 95% 96% 97%  Weight:      Height:          Constitutional: Alert and oriented x 2 .  Person and place but not to time.  Not in any apparent distress.  Dried blood over the left ear HEENT:      Head: Normocephalic and atraumatic.         Eyes: PERLA, EOMI, Conjunctivae are normal. Sclera is non-icteric.       Mouth/Throat: Mucous membranes are moist.       Neck: Supple with no signs of meningismus. Cardiovascular: Regular rate and rhythm. No murmurs, gallops, or rubs. 2+ symmetrical distal pulses are present . No JVD. No LE edema Respiratory: Tachypneic .  Rhonchi involving right lung base. No wheezes or crackles  Gastrointestinal: Soft, non tender, and non distended with positive bowel sounds.  Genitourinary: No CVA tenderness. Musculoskeletal: Nontender with normal range of motion in all extremities. No cyanosis, or erythema of extremities. Neurologic:  Face is symmetric. Moving all extremities. No gross focal neurologic deficits. Skin: Skin is warm, dry.  No rash or ulcers Psychiatric: Mood and affect are normal    Labs on Admission: I have personally reviewed following labs and imaging studies  CBC: Recent Labs  Lab 04/03/21 0650  WBC 16.9*  NEUTROABS 12.1*  HGB 11.1*  HCT 33.4*  MCV 89.8  PLT 696   Basic Metabolic Panel: Recent Labs  Lab 04/03/21 0651 04/03/21 0851  NA 130* 131*  K 3.6 4.2  CL 99 99  CO2 23 24  GLUCOSE 205* 141*  BUN 15 16  CREATININE 1.20* 1.36*  CALCIUM 8.4* 8.7*   GFR: Estimated Creatinine Clearance: 23.3 mL/min (A) (by C-G formula based on SCr of 1.36 mg/dL (H)). Liver Function Tests: Recent Labs  Lab 04/03/21 0651  AST 25  ALT 11  ALKPHOS 92  BILITOT 0.5  PROT 6.8  ALBUMIN 3.2*   No results for input(s): LIPASE, AMYLASE in the last 168 hours. No results for input(s): AMMONIA in the last 168 hours. Coagulation Profile: Recent Labs  Lab 04/03/21 0650  INR  1.1   Cardiac  Enzymes: No results for input(s): CKTOTAL, CKMB, CKMBINDEX, TROPONINI in the last 168 hours. BNP (last 3 results) No results for input(s): PROBNP in the last 8760 hours. HbA1C: No results for input(s): HGBA1C in the last 72 hours. CBG: No results for input(s): GLUCAP in the last 168 hours. Lipid Profile: No results for input(s): CHOL, HDL, LDLCALC, TRIG, CHOLHDL, LDLDIRECT in the last 72 hours. Thyroid Function Tests: No results for input(s): TSH, T4TOTAL, FREET4, T3FREE, THYROIDAB in the last 72 hours. Anemia Panel: No results for input(s): VITAMINB12, FOLATE, FERRITIN, TIBC, IRON, RETICCTPCT in the last 72 hours. Urine analysis:    Component Value Date/Time   COLORURINE YELLOW (A) 04/03/2021 0915   APPEARANCEUR HAZY (A) 04/03/2021 0915   LABSPEC 1.010 04/03/2021 0915   PHURINE 5.0 04/03/2021 0915   GLUCOSEU NEGATIVE 04/03/2021 0915   HGBUR NEGATIVE 04/03/2021 0915   BILIRUBINUR NEGATIVE 04/03/2021 0915   KETONESUR NEGATIVE 04/03/2021 0915   PROTEINUR NEGATIVE 04/03/2021 0915   NITRITE NEGATIVE 04/03/2021 0915   LEUKOCYTESUR NEGATIVE 04/03/2021 0915    Radiological Exams on Admission: DG Chest Port 1 View  Result Date: 04/03/2021 CLINICAL DATA:  Questionable sepsis. EXAM: PORTABLE CHEST 1 VIEW COMPARISON:  09/29/2020 FINDINGS: Stable cardiomediastinal contours. Aortic atherosclerotic calcifications noted. Chronic pleuroparenchymal scarring is identified within the periphery of the right lung base. Small right pleural effusion suspected. Opacity overlying the right hemidiaphragm appears new and may represent an area of atelectasis or infiltrate. Diffusely increased interstitial markings appear similar to the previous exam. Marked bilateral glenohumeral joint osteoarthritis. Thoracic scoliosis appears convex towards the right. IMPRESSION: 1. New right base opacity which may represent atelectasis or infiltrate. 2. Suspect small right pleural effusion. 3. Stable chronic interstitial  lung disease. Electronically Signed   By: Kerby Moors M.D.   On: 04/03/2021 07:17     Assessment/Plan Principal Problem:   Sepsis ([place]) Active Problems:   Hypothyroidism   Dementia, old age, without behavioral disturbance ([place])   ABLA (acute blood loss anemia)   AKI (acute kidney injury) [facility])   Patient is an 88 year old female who presents to the ER for evaluation after she was found bleeding from a laceration involving her left ear.   Sepsis ([place]) As evidenced by hypothermia with a temp of 94.2, hypotension, marked leukocytosis, lactic acidosis and a right lower lobe infiltrate. Continue aggressive IV fluid resuscitation Trend lactic acid level Place patient empirically on antibiotic therapy with Rocephin and Zithromax for presumed community-acquired pneumonia Speech therapy consult for swallow function evaluation to rule out aspiration Follow-up results of blood cultures.     Acute blood loss anemia Patient was sent to the ER for evaluation of bleeding from a laceration involving her left ear which has resolved. Hypotension may be as a result of hypovolemia from blood loss. Will repeat H&H     AKI Most likely secondary to ATN from hypotension At baseline patient has a serum creatinine of 0.68 and it is 1.3 on admission. Avoid nephrotoxic agents Continue IV fluid hydration Repeat renal parameters in a.m.    Hypothyroidism Continue Synthroid    Dementia Continue Namenda, Zoloft and Aricept    DVT prophylaxis: SCD  Code Status: full code  Family Communication:  Greater than 50% of time was spent discussing patient's condition and plan of care with patient's daughter Leslie Dales over the phone.  All questions and concerns have been addressed.  She verbalizes understanding and agrees with the plan. Disposition Plan: Back to previous home environment Consults  called: none  Status:At the time of admission, it appears that the appropriate admission status for  this patient is inpatient. This is judged to be reasonable and necessary to provide the required intensity of service to ensure the patient's safety given the presenting symptoms, physical exam findings, and initial radiographic and laboratory data in the context of their comorbid conditions. Patient requires inpatient status due to high intensity of service, high risk for further deterioration and high frequency of surveillance required.     Collier Bullock MD [facility]     04/03/2021, 10:23 AM

## 2021-04-03 NOTE — Progress Notes (Signed)
PHARMACIST - PHYSICIAN COMMUNICATION  CONCERNING:  Enoxaparin (Lovenox) for DVT Prophylaxis    RECOMMENDATION: Patient was prescribed enoxaprin 40mg  q24 hours for VTE prophylaxis.   Filed Weights   04/03/21 0614  Weight: 67.1 kg (148 lb)    Body mass index is 26.22 kg/m.  Estimated Creatinine Clearance: 23.3 mL/min (A) (by C-G formula based on SCr of 1.36 mg/dL (H)).   Based on [place] patient is candidate for enoxaparin 30mg  every 24 hours based on CrCl <29ml/min  DESCRIPTION: Pharmacy has adjusted enoxaparin dose per [facility] policy.  Patient is now receiving enoxaparin 30 mg every 24 hours    Lorna Dibble, PharmD Clinical Pharmacist  04/03/2021 11:09 AM

## 2021-04-03 NOTE — ED Provider Notes (Addendum)
°  Patient received in signout from Dr. Karma Greaser pending blood work after acute atraumatic bleeding to her left ear and reportedly quite a large amount of blood on scene.  Her blood counts are actually quite improved from previous readings and her soft low pressures resolved after liter of IV fluids.  Lactic acidosis is present, and improving with fluid resuscitation, but still has a fairly significant AKI despite these IV fluids on metabolic panel recheck.  CBC with leukocytosis and x-ray concerning with right basilar infiltrate, so we will start antibiotics for CAP.  I consulted with medicine who agrees to admit.     Vladimir Crofts, MD 04/03/21 [id number]    Vladimir Crofts, MD 04/03/21 1053

## 2021-04-03 NOTE — ED Provider Notes (Signed)
[facility] Provider Note    Event Date/Time   First MD Initiated Contact with Patient 04/03/21 [phone]     (approximate)   History   ear bleeding  Level 5 caveat:  history/ROS limited by chronic dementia  HPI  Sonya Carey is a 88 y.o. female whose listed medical history and the documents from [facility] report a history of dementia (Alzheimer's) and list her on the paperwork as DO NOT RESUSCITATE, though she did not come with the [place] paper.  She presents for evaluation of acute onset bleeding.  Reportedly a staff member from [facility] went to check on her this morning and discovered her lying in a "giant" pool of blood with clots.  The paramedics said that the scene had an astonishing amount of blood, and stated that "it looked like she had been shot in the head".  There were large blood clots all around her neck and shoulders and head.  They could not immediately identify an obvious source of bleeding and the patient was awake and alert and not in distress.  After they spent time cleaning up some of the blood, they were able to identify a small wound on the inside of the left earlobe that was oozing blood but not rapidly or pulsatile bleeding.  They applied some pressure to that wound, cleaned up the what they could, and brought her to the ED.  The patient is awake and alert and oriented to self and location.  She is denying any pain but reports that she feels nauseated.  Shortly after she arrived she reported that she now has a headache but initially she was in no pain.  She denies any ear pain and any recent trauma.  She is not having chest pain, shortness of breath, nor abdominal pain.  She does not know what happened.     Physical Exam   Triage Vital Signs: ED Triage Vitals  Enc Vitals Group     BP 04/03/21 0615 (!) 81/49     Pulse Rate 04/03/21 0612 89     Resp 04/03/21 0612 20     Temp 04/03/21 0612 (!) 94.2 F (34.6 C)     Temp Source  04/03/21 0612 Axillary     SpO2 04/03/21 0612 98 %     Weight 04/03/21 0614 67.1 kg (148 lb)     Height 04/03/21 0614 1.6 m (5\' 3" )     Head Circumference --      Peak Flow --      Pain Score --      Pain Loc --      Pain Edu? --      Excl. in [place]? --     Most recent vital signs: Vitals:   04/03/21 0700 04/03/21 0715  BP: (!) 71/45 (!) 95/55  Pulse: 79 86  Resp: 17 17  Temp:    SpO2: 97% 98%     General: Awake, initially in no distress but quickly became nauseated and started to vomit. CV:  Good peripheral perfusion.  Normal heart sounds. Resp:  Normal effort.  Lungs clear to auscultation. Abd:  No distention.  No tenderness to palpation. Other:  Patient has daily significant amount of dried blood in and around her hair and neck.  There is no active source of bleeding and I cannot find a wound on her scalp.  The left ear is clearly the source of the bleeding based on the pattern and dried blood.  Although  she has some blood in her ear canal, I do not believe this is hemotympanum.  She has a small scratch or puncture wound that is currently scabbed over and not bleeding within the triangular fossa of the left earlobe.  I was not able to easily remove the staff by gently picking at it.  No other wound can be identified.  ED Results / Procedures / Treatments   Labs (all labs ordered are listed, but only abnormal results are displayed) Labs Reviewed  CBC WITH DIFFERENTIAL/PLATELET - Abnormal; Notable for the following components:      Result Value   WBC 16.9 (*)    RBC 3.72 (*)    Hemoglobin 11.1 (*)    HCT 33.4 (*)    Neutro Abs 12.1 (*)    Monocytes Absolute 1.1 (*)    Abs Immature Granulocytes 0.21 (*)    All other components within normal limits  LACTIC ACID, PLASMA - Abnormal; Notable for the following components:   Lactic Acid, Venous 4.1 (*)    All other components within normal limits  COMPREHENSIVE METABOLIC PANEL - Abnormal; Notable for the following components:    Sodium 130 (*)    Glucose, Bld 205 (*)    Creatinine, Ser 1.20 (*)    Calcium 8.4 (*)    Albumin 3.2 (*)    GFR, Estimated 42 (*)    All other components within normal limits  RESP PANEL BY RT-PCR (FLU A&B, COVID) ARPGX2  URINE CULTURE  CULTURE, BLOOD (SINGLE)  PROTIME-INR  APTT  LACTIC ACID, PLASMA  URINALYSIS, COMPLETE (UACMP) WITH MICROSCOPIC  BASIC METABOLIC PANEL  TYPE AND SCREEN     EKG  ED ECG REPORT I, Hinda Kehr, the attending physician, personally viewed and interpreted this ECG.  Date: 04/03/2021 EKG Time: 6:47 AM Rate: 90 Rhythm: normal sinus rhythm QRS Axis: normal Intervals: normal ST/T Wave abnormalities: normal Narrative Interpretation: no evidence of acute ischemia   PROCEDURES:  Critical Care performed: No  .1-3 Lead EKG Interpretation Performed by: Hinda Kehr, MD Authorized by: Hinda Kehr, MD     Interpretation: normal     ECG rate:  85   ECG rate assessment: normal     Rhythm: sinus rhythm     Ectopy: none     Conduction: normal     MEDICATIONS ORDERED IN ED: Medications  lactated ringers bolus 1,000 mL (1,000 mLs Intravenous New Bag/Given 04/03/21 0700)  ondansetron (ZOFRAN) injection 4 mg (4 mg Intravenous Given 04/03/21 0753)  acetaminophen (TYLENOL) tablet 1,000 mg (1,000 mg Oral Given 04/03/21 0753)     IMPRESSION / MDM / ASSESSMENT AND PLAN / ED COURSE  I reviewed the triage vital signs and the nursing notes.                              Differential diagnosis includes, but is not limited to, puncture wound leading to acute blood loss/anemia, sepsis, scalp wound, head injury including intracranial bleeding.  The patient is on the cardiac monitor to evaluate for evidence of arrhythmia and/or significant heart rate changes.  Of note, although the nursing facility staff told paramedics that she is full code, her paperwork clearly documents that she is DNR, and this is supported by prior recent records in [facility], so I put in  the order for DNR to respect her previously stated wishes.  Patient is awake and alert in no distress initially although she developed nausea, headache, and vomiting  during the time we are getting her settled into her room, hooked up to monitor, etc.  Initially her oral temperature was interpreted as low at around 94 degrees but we obtain a rectal temperature which was normal at 98.9 which is reassuring.  However her initial blood pressure is quite low including a systolic in the 48G and I ordered 1 L lactated Ringer's.  Given the obvious blood loss, I am concerned for hypovolemia secondary to blood loss even though this seems surprising for such a small wound in her left earlobe.  However it is possible it has been bleeding for hours.  CBC is pending as well as a type and screen and coagulation studies although no anticoagulants are listed on her MAR which I reviewed.  Given the unusual story and presentation as well as her hypotension, I ordered the "possible sepsis" work-up as well as type and screen in case she requires a transfusion.  She has no infectious signs or symptoms and she is denying anything other than nausea and headache at this time.  I ordered Zofran 4 mg IV.  I am going to hold off on imaging at this time given that she is quite hypertensive and I would like to see her blood pressure improved before sending her to the CT scanner.  I doubt intracranial bleed given no obvious sign of trauma; I think that the small wound on her left earlobe may simply be from a scratch, possibly from her own fingernails, that simply bled for a long time and resulted in acute blood loss and resulting nausea and other symptoms.    Clinical Course as of 04/03/21 0802  Fri Apr 03, 2021  0638 Verified that rectal temperature is 98.9 degrees.  I doubt sepsis but will proceed with "possible sepsis" evaluation. [CF]  [phone] Transferring ED care to Dr. Vladimir Crofts to follow up on labs, monitor vital signs,  reassess. [CF]    Clinical Course User Index [CF] Hinda Kehr, MD     FINAL CLINICAL IMPRESSION(S) / ED DIAGNOSES   Final diagnoses:  Bleeding  Nausea and vomiting, unspecified vomiting type  Generalized headache     Rx / DC Orders   ED Discharge Orders     None        Note:  This document was prepared using Dragon voice recognition software and may include unintentional dictation errors.   Hinda Kehr, MD 04/03/21 [phone]

## 2021-04-03 NOTE — Progress Notes (Signed)
Speech therapist at bedside.

## 2021-04-03 NOTE — ED Triage Notes (Signed)
Pt to ED via [facility] from [facility].  When staff went to give meds this am, pt was noted to have large amount of blood around head and neck. Pt is alert to person, place and situation.  Pt has small abrasion/lac to left ear. EMS estimated approximately 1012mL blood loss. Pt c/o being cold and left ear pain. Pt c/o nausea, received zofran 4mg  ODT at facility. EMS VS 113/47

## 2021-04-03 NOTE — Plan of Care (Signed)
  Problem: Pain Managment: Goal: General experience of comfort will improve Outcome: Progressing   Problem: Safety: Goal: Ability to remain free from injury will improve Outcome: Progressing   Problem: Skin Integrity: Goal: Risk for impaired skin integrity will decrease Outcome: Progressing   

## 2021-04-04 LAB — HEMOGLOBIN AND HEMATOCRIT, BLOOD
HCT: 20.6 % — ABNORMAL LOW (ref 36.0–46.0)
Hemoglobin: 6.8 g/dL — ABNORMAL LOW (ref 12.0–15.0)

## 2021-04-04 LAB — PROTIME-INR
INR: 1.2 (ref 0.8–1.2)
Prothrombin Time: 15.3 seconds — ABNORMAL HIGH (ref 11.4–15.2)

## 2021-04-04 LAB — CORTISOL-AM, BLOOD: Cortisol - AM: 11.8 ug/dL (ref 6.7–22.6)

## 2021-04-04 LAB — BASIC METABOLIC PANEL
Anion gap: 7 (ref 5–15)
BUN: 19 mg/dL (ref 8–23)
CO2: 27 mmol/L (ref 22–32)
Calcium: 8.1 mg/dL — ABNORMAL LOW (ref 8.9–10.3)
Chloride: 99 mmol/L (ref 98–111)
Creatinine, Ser: 1.18 mg/dL — ABNORMAL HIGH (ref 0.44–1.00)
GFR, Estimated: 43 mL/min — ABNORMAL LOW (ref >60)
Glucose, Bld: 89 mg/dL (ref 70–99)
Potassium: 4.3 mmol/L (ref 3.5–5.1)
Sodium: 133 mmol/L — ABNORMAL LOW (ref 135–145)

## 2021-04-04 LAB — CBC
HCT: 20.3 % — ABNORMAL LOW (ref 36.0–46.0)
Hemoglobin: 6.9 g/dL — ABNORMAL LOW (ref 12.0–15.0)
MCH: 30.5 pg (ref 26.0–34.0)
MCHC: 34 g/dL (ref 30.0–36.0)
MCV: 89.8 fL (ref 80.0–100.0)
Platelets: 190 10*3/uL (ref 150–400)
RBC: 2.26 MIL/uL — ABNORMAL LOW (ref 3.87–5.11)
RDW: 14 % (ref 11.5–15.5)
WBC: 8.1 10*3/uL (ref 4.0–10.5)
nRBC: 0 % (ref 0.0–0.2)

## 2021-04-04 LAB — PREPARE RBC (CROSSMATCH)

## 2021-04-04 LAB — URINE CULTURE: Culture: NO GROWTH

## 2021-04-04 LAB — PROCALCITONIN: Procalcitonin: <0.1 ng/mL

## 2021-04-04 MED ORDER — SODIUM CHLORIDE 0.9% IV SOLUTION: Freq: Once | INTRAVENOUS | Status: AC
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
## 2021-04-04 NOTE — Progress Notes (Signed)
°   04/04/21 1837  Assess: MEWS Score  Temp 98.2 F (36.8 C)  BP (!) 102/49  Pulse Rate 98  Resp 16  Level of Consciousness Alert  SpO2 100 %  O2 Device Nasal Cannula  Assess: MEWS Score  MEWS Temp 0  MEWS Systolic 0  MEWS Pulse 0  MEWS RR 0  MEWS LOC 0  MEWS Score 0  MEWS Score Color Green  Assess: if the MEWS score is Yellow or Red  Were vital signs taken at a resting state? Yes  Focused Assessment No change from prior assessment  Does the patient meet 2 or more of the SIRS criteria? No  MEWS guidelines implemented *See Row Information* No, vital signs rechecked  Document  Patient Outcome Other (Comment) (stable/no intervention)  Progress note created (see row info) Yes  Assess: SIRS CRITERIA  SIRS Temperature  0  SIRS Pulse 1  SIRS Respirations  0  SIRS WBC 0  SIRS Score Sum  1

## 2021-04-05 LAB — TYPE AND SCREEN
ABO/RH(D): O POS
Antibody Screen: NEGATIVE
Unit division: 0

## 2021-04-05 LAB — BASIC METABOLIC PANEL
Anion gap: 7 (ref 5–15)
BUN: 17 mg/dL (ref 8–23)
CO2: 27 mmol/L (ref 22–32)
Calcium: 8.4 mg/dL — ABNORMAL LOW (ref 8.9–10.3)
Chloride: 101 mmol/L (ref 98–111)
Creatinine, Ser: 0.87 mg/dL (ref 0.44–1.00)
GFR, Estimated: >60 mL/min (ref >60)
Glucose, Bld: 94 mg/dL (ref 70–99)
Potassium: 4.1 mmol/L (ref 3.5–5.1)
Sodium: 135 mmol/L (ref 135–145)

## 2021-04-05 LAB — CBC
HCT: 24 % — ABNORMAL LOW (ref 36.0–46.0)
Hemoglobin: 8.2 g/dL — ABNORMAL LOW (ref 12.0–15.0)
MCH: 31.1 pg (ref 26.0–34.0)
MCHC: 34.2 g/dL (ref 30.0–36.0)
MCV: 90.9 fL (ref 80.0–100.0)
Platelets: 162 10*3/uL (ref 150–400)
RBC: 2.64 MIL/uL — ABNORMAL LOW (ref 3.87–5.11)
RDW: 13.7 % (ref 11.5–15.5)
WBC: 8.3 10*3/uL (ref 4.0–10.5)
nRBC: 0 % (ref 0.0–0.2)

## 2021-04-05 LAB — BPAM RBC
Blood Product Expiration Date: 202302222359
ISSUE DATE / TIME: 202301211723
Unit Type and Rh: 5100

## 2021-04-05 LAB — MAGNESIUM: Magnesium: 1.8 mg/dL (ref 1.7–2.4)

## 2021-04-05 NOTE — Discharge Summary (Addendum)
Physician Discharge Summary   Sonya Carey  female DOB: 11/25/1924  JHE:[id number]  PCP: Venia Carbon, MD  Admit date: 04/03/2021 Discharge date: 04/06/2021  Admitted From: SNF Disposition:  SNF CODE STATUS: DNR  Discharge Instructions     Ambulatory referral to ENT   Complete by: As directed    Possible bleeding from left ear.   Discharge instructions   Complete by: As directed    Please check hemoglobin 1 week after discharge.  Need to see ENT as outpatient for possible left ear bleeding.  You do not have a pneumonia that needs antibiotic treatment.  You do have acute kidney injury likely due to dehydration, which has resolved with IV fluid.  Please ensure oral hydration. [facility] Course:  For full details, please see H&P, progress notes, consult notes and ancillary notes.  Briefly,  Sonya Carey is a 88 y.o. female with medical history significant for hypothyroidism, hypertension, depression and dementia who was brought into the ER by EMS for evaluation after she was found by the nursing home staff to have a large amount of blood around her head and neck. Per EMS they estimated about 1 L blood loss and patient was found to have a small abrasion/laceration to her left ear.    Acute on chronic anemia 2/2 blood loss Hx of iron def --Hgb 11.1 on presentation, dropped to 6.8 next morning, likely hemoconcentrated and then diluted by IVF, since not much bleeding observed after presentation. --no obvious skin laceration seen on left ear, blood may have come from inside left ear. --pt received 1u pRBC with Hgb increase to 8.2 the next day, and was stable at 8.4 prior to discharge. --cont home iron supplement. --Referral made for outpatient ENT followup due to possible bleeding from the left ear.   Hypotension, resolved --likely due to dehydration and blood loss.  BP normalized prior to discharge.   Sepsis (POA), ruled out --started on  ceftriaxone and azithro on admission for presumed PNA. --no respiratory symptoms.  Procal neg.  Right base opacity in CXR likely represent atelectasis. --abx d/c'ed.   AKI --Cr 1.36.  baseline around 0.7-0.9.  Likely due to dehydration or hypotension.  --s/p IVF hydration with Cr back to baseline 0.64 prior to discharge. --encourage oral hydration at the SNF   Hypothyroidism Continue Synthroid   Dementia --cont home Namenda, Zoloft and Aricept    Discharge Diagnoses:  Principal Problem:   Sepsis ([place]) Active Problems:   Hypothyroidism   Dementia, old age, without behavioral disturbance ([place])   ABLA (acute blood loss anemia)   AKI (acute kidney injury) ([place])   30 Day Unplanned Readmission Risk Score    Flowsheet Row ED to Hosp-Admission (Current) from 04/03/2021 in [place] (1A)  30 Day Unplanned Readmission Risk Score (%) 19.86 Filed at 04/05/2021 0801       This score is the patient's risk of an unplanned readmission within 30 days of being discharged (0 -100%). The score is based on dignosis, age, lab data, medications, orders, and past utilization.   Low:  0-14.9   Medium: 15-21.9   High: 22-29.9   Extreme: 30 and above         Discharge Instructions:  Allergies as of 04/06/2021       Reactions   Celebrex [celecoxib] Other (See Comments)   GI upset   Latex Itching   NEGATIVE LATEX IgE (< 0.10) 11/04/2016  Statins Other (See Comments)   Muscle pain   Adhesive [tape] Rash        Medication List     STOP taking these medications    enoxaparin 40 MG/0.4ML injection Commonly known as: LOVENOX   HYDROcodone-acetaminophen 5-325 MG tablet Commonly known as: NORCO/VICODIN   oxyCODONE 5 MG immediate release tablet Commonly known as: Oxy IR/ROXICODONE   pantoprazole 40 MG tablet Commonly known as: PROTONIX       TAKE these medications    acetaminophen 325 MG tablet Commonly known as: TYLENOL Take 650 mg by  mouth 3 (three) times daily.   alendronate 70 MG tablet Commonly known as: Fosamax Take 1 tablet (70 mg total) by mouth once a week. Take with a full glass of water on an empty stomach. What changed: additional instructions   calcium carbonate 500 MG chewable tablet Commonly known as: TUMS - dosed in mg elemental calcium Chew 2 tablets (400 mg of elemental calcium total) by mouth 2 (two) times daily.   cetirizine 10 MG tablet Commonly known as: ZYRTEC Take 10 mg by mouth daily.   cholecalciferol 25 MCG (1000 UNIT) tablet Commonly known as: VITAMIN D Take 1,000 Units by mouth daily.   diclofenac Sodium 1 % Gel Commonly known as: VOLTAREN Apply 2 g topically every 8 (eight) hours as needed.   docusate sodium 100 MG capsule Commonly known as: COLACE Take 1 capsule (100 mg total) by mouth 2 (two) times daily as needed for mild constipation.   donepezil 10 MG tablet Commonly known as: ARICEPT Take 10 mg by mouth at bedtime.   ferrous sulfate 325 (65 FE) MG tablet Take 1 tablet (325 mg total) by mouth daily with breakfast.   hydroxypropyl methylcellulose / hypromellose 2.5 % ophthalmic solution Commonly known as: ISOPTO TEARS / GONIOVISC Place 1 drop into both eyes 2 (two) times daily as needed for dry eyes.   lactase 3000 units tablet Commonly known as: LACTAID Take 6,000 Units by mouth 3 (three) times daily with meals.   Lidocaine 4 % Ptch Apply 1 patch topically daily.   magnesium hydroxide 400 MG/5ML suspension Commonly known as: MILK OF MAGNESIA Take 30 mLs by mouth daily as needed for mild constipation.   memantine 10 MG tablet Commonly known as: NAMENDA Take 10 mg by mouth 2 (two) times daily.   Multivitamin Adult Tabs Take 1 tablet by mouth daily. SHAKLEE "LIFE STRIP" MULTIVITAMIN PACK   NP Thyroid 15 MG tablet Generic drug: thyroid Take 15 mg by mouth every morning.   NP Thyroid 60 MG tablet Generic drug: thyroid Take 60 mg by mouth in the morning.    olopatadine 0.1 % ophthalmic solution Commonly known as: PATANOL Place 1 drop into the left eye daily.   PreserVision AREDS 2 Caps Take 1 tablet by mouth in the morning and at bedtime.   sertraline 100 MG tablet Commonly known as: ZOLOFT Take 150 mg by mouth at bedtime.         Contact information for follow-up providers     Venia Carbon, MD. Go on 04/14/2021.   Specialties: Internal Medicine, Pediatrics Why: at 10:15am on 04/14/21 Contact information: [place] [address] [phone]              Contact information for after-discharge care     Destination     [facility] .   Service: Skilled Nursing Contact information: [place] [phone]  Allergies  Allergen Reactions   Celebrex [Celecoxib] Other (See Comments)    GI upset   Latex Itching    NEGATIVE LATEX IgE (< 0.10) 11/04/2016   Statins Other (See Comments)    Muscle pain   Adhesive [Tape] Rash     The results of significant diagnostics from this hospitalization (including imaging, microbiology, ancillary and laboratory) are listed below for reference.   Consultations:   Procedures/Studies: DG Chest Port 1 View  Result Date: 04/03/2021 CLINICAL DATA:  Questionable sepsis. EXAM: PORTABLE CHEST 1 VIEW COMPARISON:  09/29/2020 FINDINGS: Stable cardiomediastinal contours. Aortic atherosclerotic calcifications noted. Chronic pleuroparenchymal scarring is identified within the periphery of the right lung base. Small right pleural effusion suspected. Opacity overlying the right hemidiaphragm appears new and may represent an area of atelectasis or infiltrate. Diffusely increased interstitial markings appear similar to the previous exam. Marked bilateral glenohumeral joint osteoarthritis. Thoracic scoliosis appears convex towards the right. IMPRESSION: 1. New right base opacity which may  represent atelectasis or infiltrate. 2. Suspect small right pleural effusion. 3. Stable chronic interstitial lung disease. Electronically Signed   By: Kerby Moors M.D.   On: 04/03/2021 07:17      Labs: BNP (last 3 results) No results for input(s): BNP in the last 8760 hours. Basic Metabolic Panel: Recent Labs  Lab 04/03/21 0651 04/03/21 0851 04/04/21 0532 04/05/21 0424 04/06/21 0538  NA 130* 131* 133* 135 133*  K 3.6 4.2 4.3 4.1 4.3  CL 99 99 99 101 100  CO2 23 24 27 27 28   GLUCOSE 205* 141* 89 94 101*  BUN 15 16 19 17 18   CREATININE 1.20* 1.36* 1.18* 0.87 0.64  CALCIUM 8.4* 8.7* 8.1* 8.4* 8.5*  MG  --   --   --  1.8 2.0   Liver Function Tests: Recent Labs  Lab 04/03/21 0651  AST 25  ALT 11  ALKPHOS 92  BILITOT 0.5  PROT 6.8  ALBUMIN 3.2*   No results for input(s): LIPASE, AMYLASE in the last 168 hours. No results for input(s): AMMONIA in the last 168 hours. CBC: Recent Labs  Lab 04/03/21 0650 04/03/21 1454 04/04/21 0532 04/04/21 1125 04/05/21 0424 04/06/21 0538  WBC 16.9*  --  8.1  --  8.3 8.8  NEUTROABS 12.1*  --   --   --   --   --   HGB 11.1* 9.6* 6.9* 6.8* 8.2* 8.4*  HCT 33.4* 28.0* 20.3* 20.6* 24.0* 25.3*  MCV 89.8  --  89.8  --  90.9 90.0  PLT 382  --  190  --  162 168   Cardiac Enzymes: No results for input(s): CKTOTAL, CKMB, CKMBINDEX, TROPONINI in the last 168 hours. BNP: Invalid input(s): POCBNP CBG: No results for input(s): GLUCAP in the last 168 hours. D-Dimer No results for input(s): DDIMER in the last 72 hours. Hgb A1c No results for input(s): HGBA1C in the last 72 hours. Lipid Profile No results for input(s): CHOL, HDL, LDLCALC, TRIG, CHOLHDL, LDLDIRECT in the last 72 hours. Thyroid function studies No results for input(s): TSH, T4TOTAL, T3FREE, THYROIDAB in the last 72 hours.  Invalid input(s): FREET3 Anemia work up No results for input(s): VITAMINB12, FOLATE, FERRITIN, TIBC, IRON, RETICCTPCT in the last 72  hours. Urinalysis    Component Value Date/Time   COLORURINE YELLOW (A) 04/03/2021 0915   APPEARANCEUR HAZY (A) 04/03/2021 0915   LABSPEC 1.010 04/03/2021 0915   PHURINE 5.0 04/03/2021 0915   GLUCOSEU NEGATIVE 04/03/2021 0915   HGBUR NEGATIVE 04/03/2021 0915  BILIRUBINUR NEGATIVE 04/03/2021 0915   KETONESUR NEGATIVE 04/03/2021 0915   PROTEINUR NEGATIVE 04/03/2021 0915   NITRITE NEGATIVE 04/03/2021 0915   LEUKOCYTESUR NEGATIVE 04/03/2021 0915   Sepsis Labs Invalid input(s): PROCALCITONIN,  WBC,  LACTICIDVEN Microbiology Recent Results (from the past 240 hour(s))  Resp Panel by RT-PCR (Flu A&B, Covid) Nasopharyngeal Swab     Status: None   Collection Time: 04/03/21  6:50 AM   Specimen: Nasopharyngeal Swab; Nasopharyngeal(NP) swabs in vial transport medium  Result Value Ref Range Status   SARS Coronavirus 2 by RT PCR NEGATIVE NEGATIVE Final    Comment: (NOTE) SARS-CoV-2 target nucleic acids are NOT DETECTED.  The SARS-CoV-2 RNA is generally detectable in upper respiratory specimens during the acute phase of infection. The lowest concentration of SARS-CoV-2 viral copies this assay can detect is 138 copies/mL. A negative result does not preclude SARS-Cov-2 infection and should not be used as the sole basis for treatment or other patient management decisions. A negative result may occur with  improper specimen collection/handling, submission of specimen other than nasopharyngeal swab, presence of viral mutation(s) within the areas targeted by this assay, and inadequate number of viral copies(<138 copies/mL). A negative result must be combined with clinical observations, patient history, and epidemiological information. The expected result is Negative.  Fact Sheet for Patients:  EntrepreneurPulse.com.au  Fact Sheet for Healthcare Providers:  IncredibleEmployment.be  This test is no t yet approved or cleared by the [place] FDA and  has  been authorized for detection and/or diagnosis of SARS-CoV-2 by FDA under an Emergency Use Authorization (EUA). This EUA will remain  in effect (meaning this test can be used) for the duration of the COVID-19 declaration under Section 564(b)(1) of the Act, 21 U.S.C.section 360bbb-3(b)(1), unless the authorization is terminated  or revoked sooner.       Influenza A by PCR NEGATIVE NEGATIVE Final   Influenza B by PCR NEGATIVE NEGATIVE Final    Comment: (NOTE) The Xpert Xpress SARS-CoV-2/FLU/RSV plus assay is intended as an aid in the diagnosis of influenza from Nasopharyngeal swab specimens and should not be used as a sole basis for treatment. Nasal washings and aspirates are unacceptable for Xpert Xpress SARS-CoV-2/FLU/RSV testing.  Fact Sheet for Patients: EntrepreneurPulse.com.au  Fact Sheet for Healthcare Providers: IncredibleEmployment.be  This test is not yet approved or cleared by the [place] FDA and has been authorized for detection and/or diagnosis of SARS-CoV-2 by FDA under an Emergency Use Authorization (EUA). This EUA will remain in effect (meaning this test can be used) for the duration of the COVID-19 declaration under Section 564(b)(1) of the Act, 21 U.S.C. section 360bbb-3(b)(1), unless the authorization is terminated or revoked.  Performed at [facility], [address] [place], [place] [address]   Blood culture (routine single)     Status: None (Preliminary result)   Collection Time: 04/03/21  6:51 AM   Specimen: BLOOD  Result Value Ref Range Status   Specimen Description BLOOD BLOOD RIGHT FOREARM  Final   Special Requests   Final    BOTTLES DRAWN AEROBIC AND ANAEROBIC Blood Culture adequate volume   Culture   Final    NO GROWTH 3 DAYS Performed at [facility], [address], [place], [place] [address]    Report Status PENDING  Incomplete  Urine Culture     Status: None   Collection Time:  04/03/21  9:15 AM   Specimen: In/Out Cath Urine  Result Value Ref Range Status   Specimen Description  Final    IN/OUT CATH URINE Performed at [facility], [address], [place], [place] [address]    Special Requests   Final    NONE Performed at [facility] [address], [place], [place] [address]    Culture   Final    NO GROWTH Performed at [facility], [place] [address] [place], [place] [address]    Report Status 04/04/2021 FINAL  Final  Resp Panel by RT-PCR (Flu A&B, Covid) Nasopharyngeal Swab     Status: None   Collection Time: 04/06/21 10:38 AM   Specimen: Nasopharyngeal Swab; Nasopharyngeal(NP) swabs in vial transport medium  Result Value Ref Range Status   SARS Coronavirus 2 by RT PCR NEGATIVE NEGATIVE Final    Comment: (NOTE) SARS-CoV-2 target nucleic acids are NOT DETECTED.  The SARS-CoV-2 RNA is generally detectable in upper respiratory specimens during the acute phase of infection. The lowest concentration of SARS-CoV-2 viral copies this assay can detect is 138 copies/mL. A negative result does not preclude SARS-Cov-2 infection and should not be used as the sole basis for treatment or other patient management decisions. A negative result may occur with  improper specimen collection/handling, submission of specimen other than nasopharyngeal swab, presence of viral mutation(s) within the areas targeted by this assay, and inadequate number of viral copies(<138 copies/mL). A negative result must be combined with clinical observations, patient history, and epidemiological information. The expected result is Negative.  Fact Sheet for Patients:  EntrepreneurPulse.com.au  Fact Sheet for Healthcare Providers:  IncredibleEmployment.be  This test is no t yet approved or cleared by the [place] FDA and  has been authorized for detection and/or diagnosis of SARS-CoV-2 by FDA under an Emergency Use  Authorization (EUA). This EUA will remain  in effect (meaning this test can be used) for the duration of the COVID-19 declaration under Section 564(b)(1) of the Act, 21 U.S.C.section 360bbb-3(b)(1), unless the authorization is terminated  or revoked sooner.       Influenza A by PCR NEGATIVE NEGATIVE Final   Influenza B by PCR NEGATIVE NEGATIVE Final    Comment: (NOTE) The Xpert Xpress SARS-CoV-2/FLU/RSV plus assay is intended as an aid in the diagnosis of influenza from Nasopharyngeal swab specimens and should not be used as a sole basis for treatment. Nasal washings and aspirates are unacceptable for Xpert Xpress SARS-CoV-2/FLU/RSV testing.  Fact Sheet for Patients: EntrepreneurPulse.com.au  Fact Sheet for Healthcare Providers: IncredibleEmployment.be  This test is not yet approved or cleared by the [place] FDA and has been authorized for detection and/or diagnosis of SARS-CoV-2 by FDA under an Emergency Use Authorization (EUA). This EUA will remain in effect (meaning this test can be used) for the duration of the COVID-19 declaration under Section 564(b)(1) of the Act, 21 U.S.C. section 360bbb-3(b)(1), unless the authorization is terminated or revoked.  Performed at [facility], [address] [place],  [address]      Total time spend on discharging this patient, including the last patient exam, discussing the hospital stay, instructions for ongoing care as it relates to all pertinent caregivers, as well as preparing the medical discharge records, prescriptions, and/or referrals as applicable, is 35 minutes.    Enzo Bi, MD  [facility] 04/06/2021, 2:15 PM

## 2021-04-05 NOTE — Progress Notes (Signed)
PROGRESS NOTE    MINIE ROADCAP  FTD:[id number] DOB: [birth year] DOA: 04/03/2021 PCP: Venia Carbon, MD  [id number]   Assessment & Plan:   Principal Problem:   Sepsis [facility]) Active Problems:   Hypothyroidism   Dementia, old age, without behavioral disturbance ([place])   ABLA (acute blood loss anemia)   AKI (acute kidney injury) ([place])   Sonya Carey is a 88 y.o. female with medical history significant for hypothyroidism, hypertension, depression and dementia who was brought into the ER by EMS for evaluation after she was found by the nursing home staff to have a large amount of blood around her head and neck. Per EMS they estimated about 1 L blood loss and patient was found to have a small abrasion/laceration to her left ear.    Acute on chronic anemia 2/2 blood loss --Hgb 11.1 on presentation, dropped to 6.8 next morning, likely hemoconcentrated and then diluted by IVF, since not much bleeding observed after presentation. --no obvious skin laceration seen on left ear, blood may have come from inside left ear. --s/p 1u pRBC  --ENT followup as outpatient  Hypotension, improved  Sepsis (POA), ruled out --no respiratory symptoms.  Procal neg.  Right base opacity in CXR likely represent atelectasis. --started on ceftriaxone and azithro on admission --d/c'ed abx   AKI --Cr 1.36.  baseline around 0.7-0.9.   --s/p IVF hydratioin, with Cr back to baseline 0.87 --encourage oral hydration   Hypothyroidism Continue Synthroid   Dementia --on home Namenda, Zoloft and Aricept --cont home regimen    DVT prophylaxis: SCD/Compression stockings Code Status: DNR  Family Communication: daughter updated on the phone today, who refused to let pt discharge today.  Level of care: Telemetry Medical Dispo:   The patient is from: SNF Anticipated d/c is to: SNF Anticipated d/c date is: tomorrow Patient currently is medically ready to d/c.   Subjective and Interval History:   Pt reported doing ok.  Not new complaints.   Objective: Vitals:   04/05/21 1220 04/05/21 1221 04/05/21 1615 04/05/21 1616  BP: 104/67 104/67 119/63 119/63  Pulse: 75 71 79 77  Resp: 18 18 18 18   Temp: 98.8 F (37.1 C) 98.8 F (37.1 C) 99 F (37.2 C) 99 F (37.2 C)  TempSrc:      SpO2: 100% 100% 100% 100%  Weight:      Height:        Intake/Output Summary (Last 24 hours) at 04/05/2021 1741 Last data filed at 04/05/2021 0700 Gross per 24 hour  Intake 405 ml  Output 860 ml  Net -455 ml   Filed Weights   04/03/21 0614  Weight: 67.1 kg    Examination:   Constitutional: NAD, alert, oriented to self and hospital HEENT: conjunctivae and lids normal, EOMI CV: No cyanosis.   RESP: normal respiratory effort, on RA   Data Reviewed: I have personally reviewed following labs and imaging studies  CBC: Recent Labs  Lab 04/03/21 0650 04/03/21 1454 04/04/21 0532 04/04/21 1125 04/05/21 0424  WBC 16.9*  --  8.1  --  8.3  NEUTROABS 12.1*  --   --   --   --   HGB 11.1* 9.6* 6.9* 6.8* 8.2*  HCT 33.4* 28.0* 20.3* 20.6* 24.0*  MCV 89.8  --  89.8  --  90.9  PLT 382  --  190  --  062   Basic Metabolic Panel: Recent Labs  Lab 04/03/21 0651 04/03/21 0851 04/04/21 0532 04/05/21 0424  NA 130* 131* 133*  135  K 3.6 4.2 4.3 4.1  CL 99 99 99 101  CO2 23 24 27 27   GLUCOSE 205* 141* 89 94  BUN 15 16 19 17   CREATININE 1.20* 1.36* 1.18* 0.87  CALCIUM 8.4* 8.7* 8.1* 8.4*  MG  --   --   --  1.8   GFR: Estimated Creatinine Clearance: 36.4 mL/min (by C-G formula based on SCr of 0.87 mg/dL). Liver Function Tests: Recent Labs  Lab 04/03/21 0651  AST 25  ALT 11  ALKPHOS 92  BILITOT 0.5  PROT 6.8  ALBUMIN 3.2*   No results for input(s): LIPASE, AMYLASE in the last 168 hours. No results for input(s): AMMONIA in the last 168 hours. Coagulation Profile: Recent Labs  Lab 04/03/21 0650 04/04/21 0532  INR 1.1 1.2   Cardiac Enzymes: No results for input(s): CKTOTAL, CKMB,  CKMBINDEX, TROPONINI in the last 168 hours. BNP (last 3 results) No results for input(s): PROBNP in the last 8760 hours. HbA1C: No results for input(s): HGBA1C in the last 72 hours. CBG: No results for input(s): GLUCAP in the last 168 hours. Lipid Profile: No results for input(s): CHOL, HDL, LDLCALC, TRIG, CHOLHDL, LDLDIRECT in the last 72 hours. Thyroid Function Tests: No results for input(s): TSH, T4TOTAL, FREET4, T3FREE, THYROIDAB in the last 72 hours. Anemia Panel: No results for input(s): VITAMINB12, FOLATE, FERRITIN, TIBC, IRON, RETICCTPCT in the last 72 hours. Sepsis Labs: Recent Labs  Lab 04/03/21 0650 04/03/21 0851 04/04/21 0532  PROCALCITON  --   --  <0.10  LATICACIDVEN 4.1* 2.9*  --     Recent Results (from the past 240 hour(s))  Resp Panel by RT-PCR (Flu A&B, Covid) Nasopharyngeal Swab     Status: None   Collection Time: 04/03/21  6:50 AM   Specimen: Nasopharyngeal Swab; Nasopharyngeal(NP) swabs in vial transport medium  Result Value Ref Range Status   SARS Coronavirus 2 by RT PCR NEGATIVE NEGATIVE Final    Comment: (NOTE) SARS-CoV-2 target nucleic acids are NOT DETECTED.  The SARS-CoV-2 RNA is generally detectable in upper respiratory specimens during the acute phase of infection. The lowest concentration of SARS-CoV-2 viral copies this assay can detect is 138 copies/mL. A negative result does not preclude SARS-Cov-2 infection and should not be used as the sole basis for treatment or other patient management decisions. A negative result may occur with  improper specimen collection/handling, submission of specimen other than nasopharyngeal swab, presence of viral mutation(s) within the areas targeted by this assay, and inadequate number of viral copies(<138 copies/mL). A negative result must be combined with clinical observations, patient history, and epidemiological information. The expected result is Negative.  Fact Sheet for Patients:   EntrepreneurPulse.com.au  Fact Sheet for Healthcare Providers:  IncredibleEmployment.be  This test is no t yet approved or cleared by the [place] FDA and  has been authorized for detection and/or diagnosis of SARS-CoV-2 by FDA under an Emergency Use Authorization (EUA). This EUA will remain  in effect (meaning this test can be used) for the duration of the COVID-19 declaration under Section 564(b)(1) of the Act, 21 U.S.C.section 360bbb-3(b)(1), unless the authorization is terminated  or revoked sooner.       Influenza A by PCR NEGATIVE NEGATIVE Final   Influenza B by PCR NEGATIVE NEGATIVE Final    Comment: (NOTE) The Xpert Xpress SARS-CoV-2/FLU/RSV plus assay is intended as an aid in the diagnosis of influenza from Nasopharyngeal swab specimens and should not be used as a sole basis for treatment. Nasal washings  and aspirates are unacceptable for Xpert Xpress SARS-CoV-2/FLU/RSV testing.  Fact Sheet for Patients: EntrepreneurPulse.com.au  Fact Sheet for Healthcare Providers: IncredibleEmployment.be  This test is not yet approved or cleared by the [place] FDA and has been authorized for detection and/or diagnosis of SARS-CoV-2 by FDA under an Emergency Use Authorization (EUA). This EUA will remain in effect (meaning this test can be used) for the duration of the COVID-19 declaration under Section 564(b)(1) of the Act, 21 U.S.C. section 360bbb-3(b)(1), unless the authorization is terminated or revoked.  Performed at [facility], [address] [place], [place] [address]   Blood culture (routine single)     Status: None (Preliminary result)   Collection Time: 04/03/21  6:51 AM   Specimen: BLOOD  Result Value Ref Range Status   Specimen Description BLOOD BLOOD RIGHT FOREARM  Final   Special Requests   Final    BOTTLES DRAWN AEROBIC AND ANAEROBIC Blood Culture adequate volume    Culture   Final    NO GROWTH 2 DAYS Performed at [facility], [address], [place], [place] [address]    Report Status PENDING  Incomplete  Urine Culture     Status: None   Collection Time: 04/03/21  9:15 AM   Specimen: In/Out Cath Urine  Result Value Ref Range Status   Specimen Description   Final    IN/OUT CATH URINE Performed at [facility], [address] [place], [place] [address]    Special Requests   Final    NONE Performed at [facility], [address], [place], [place] [address]    Culture   Final    NO GROWTH Performed at [facility], [place] [address], [place], [place] [address]    Report Status 04/04/2021 FINAL  Final      Radiology Studies: No results found.   Scheduled Meds:  calcium carbonate  400 mg of elemental calcium Oral BID   cholecalciferol  1,000 Units Oral Daily   donepezil  10 mg Oral QHS   ferrous sulfate  325 mg Oral Q breakfast   lactase  6,000 Units Oral TID WC   lidocaine  1 patch Transdermal Daily   loratadine  10 mg Oral Daily   memantine  10 mg Oral BID   multivitamin with minerals  1 tablet Oral Daily   multivitamin-lutein  1 capsule Oral Daily   olopatadine  1 drop Left Eye Daily   sertraline  150 mg Oral QHS   thyroid  15 mg Oral q morning   thyroid  60 mg Oral q AM   Continuous Infusions:   LOS: 2 days     Enzo Bi, MD [facility] If 7PM-7AM, please contact night-coverage 04/05/2021, 5:41 PM

## 2021-04-05 NOTE — NC FL2 (Signed)
[place] LEVEL OF CARE SCREENING TOOL     IDENTIFICATION  Patient Name: Sonya Carey Birthdate: 31-Dec-1924 Sex: female Admission Date (Current Location): 04/03/2021  [facility] and [place] Number:  Engineering geologist and Address:  [facility], [address] [place], [place] [address]      Provider Number: [id number]  Attending Physician Name and Address:  Enzo Bi, MD  Relative Name and Phone Number:  [name] [phone]    Current Level of Care: Hospital Recommended Level of Care: [place] Prior Approval Number:    Date Approved/Denied:   PASRR Number: [id number]  Discharge Plan: SNF    Current Diagnoses: Patient Active Problem List   Diagnosis Date Noted   Sepsis ([place]) 04/03/2021   ABLA (acute blood loss anemia) 04/03/2021   AKI (acute kidney injury) ([place]) 04/03/2021   Hypoxia    Drop in hemoglobin    Depression    Hypothyroidism 09/30/2020   Essential hypertension 09/30/2020   Closed left hip fracture, initial encounter ([place]) 09/30/2020   Closed intertrochanteric fracture of left femur ([place]) 09/29/2020   Iron deficiency anemia    Fall    Pneumothorax    Multiple rib fractures 03/01/2019   DNI (do not intubate) 03/01/2019   Dementia, old age, without behavioral disturbance ([place]) 12/27/2017    Orientation RESPIRATION BLADDER Height & Weight     Self, Situation, Place  O2 Incontinent Weight: 148 lb (67.1 kg) Height:  5\' 3"  (160 cm)  BEHAVIORAL SYMPTOMS/MOOD NEUROLOGICAL BOWEL NUTRITION STATUS      Incontinent Diet  AMBULATORY STATUS COMMUNICATION OF NEEDS Skin   Extensive Assist (Rolling walker) Verbally                         Personal Care Assistance Level of Assistance  Bathing, Feeding, Dressing Bathing Assistance: Maximum assistance Feeding assistance: Limited assistance Dressing Assistance: Maximum assistance     Functional Limitations Info  Sight, Hearing,  Speech Sight Info: Adequate Hearing Info: Adequate Speech Info: Adequate    SPECIAL CARE FACTORS FREQUENCY                       Contractures Contractures Info: Not present    Additional Factors Info  Code Status Code Status Info: DNR             Current Medications (04/05/2021):  This is the current hospital active medication list Current Facility-Administered Medications  Medication Dose Route Frequency Provider Last Rate Last Admin   acetaminophen (TYLENOL) tablet 650 mg  650 mg Oral Q6H PRN Agbata, Tochukwu, MD       Or   acetaminophen (TYLENOL) suppository 650 mg  650 mg Rectal Q6H PRN Agbata, Tochukwu, MD       calcium carbonate (TUMS - dosed in mg elemental calcium) chewable tablet 400 mg of elemental calcium  400 mg of elemental calcium Oral BID Agbata, Tochukwu, MD   400 mg of elemental calcium at 04/05/21 1014   cholecalciferol (VITAMIN D) tablet 1,000 Units  1,000 Units Oral Daily Agbata, Tochukwu, MD   1,000 Units at 04/05/21 1015   docusate sodium (COLACE) capsule 100 mg  100 mg Oral BID PRN Agbata, Tochukwu, MD       donepezil (ARICEPT) tablet 10 mg  10 mg Oral QHS Agbata, Tochukwu, MD   10 mg at 04/04/21 2046   ferrous sulfate tablet 325 mg  325 mg Oral Q breakfast Agbata,  Tochukwu, MD   325 mg at 04/05/21 1013   lactase (LACTAID) tablet 6,000 Units  6,000 Units Oral TID WC Agbata, Tochukwu, MD   6,000 Units at 04/04/21 1800   lidocaine (LIDODERM) 5 % 1 patch  1 patch Transdermal Daily Agbata, Tochukwu, MD       loratadine (CLARITIN) tablet 10 mg  10 mg Oral Daily Agbata, Tochukwu, MD   10 mg at 04/05/21 1015   memantine (NAMENDA) tablet 10 mg  10 mg Oral BID Agbata, Tochukwu, MD   10 mg at 04/05/21 1015   multivitamin with minerals tablet 1 tablet  1 tablet Oral Daily Agbata, Tochukwu, MD   1 tablet at 04/05/21 1013   multivitamin-lutein (OCUVITE-LUTEIN) capsule 1 capsule  1 capsule Oral Daily Agbata, Tochukwu, MD   1 capsule at 04/04/21 0943   olopatadine  (PATANOL) 0.1 % ophthalmic solution 1 drop  1 drop Left Eye Daily Agbata, Tochukwu, MD   1 drop at 04/04/21 0944   ondansetron (ZOFRAN) tablet 4 mg  4 mg Oral Q6H PRN Agbata, Tochukwu, MD       Or   ondansetron (ZOFRAN) injection 4 mg  4 mg Intravenous Q6H PRN Agbata, Tochukwu, MD       polyvinyl alcohol (LIQUIFILM TEARS) 1.4 % ophthalmic solution 1 drop  1 drop Both Eyes BID PRN Agbata, Tochukwu, MD       sertraline (ZOLOFT) tablet 150 mg  150 mg Oral QHS Agbata, Tochukwu, MD   150 mg at 04/04/21 2046   thyroid (ARMOUR) tablet 15 mg  15 mg Oral q morning Agbata, Tochukwu, MD   15 mg at 04/05/21 8177   thyroid (ARMOUR) tablet 60 mg  60 mg Oral q AM Agbata, Tochukwu, MD   60 mg at 04/05/21 1165     Discharge Medications: Please see discharge summary for a list of discharge medications.  Relevant Imaging Results:  Relevant Lab Results:   Additional Information SS: [id number]  Boris Sharper, LCSW

## 2021-04-05 NOTE — TOC Initial Note (Incomplete)
Transition of Care [facility]) - Initial/Assessment Note    Patient Details  Name: Sonya Carey MRN: [id number] Date of Birth: 1924-04-10  Transition of Care [facility]) CM/SW Contact:    Boris Sharper, LCSW Phone Number: 04/05/2021, 11:25 AM  Clinical Narrative:                 Pt is from [facility] facility where she is a long term care resident,   Expected Discharge Plan: [place] Barriers to Discharge: Continued Medical Work up   Patient Goals and CMS Choice     Choice offered to / list presented to : Adult Children  Expected Discharge Plan and Services Expected Discharge Plan: [place]       Living arrangements for the past 2 months: [place] Expected Discharge Date: 04/05/21                                    Prior Living Arrangements/Services Living arrangements for the past 2 months: [place] Lives with:: Facility Resident Patient language and need for interpreter reviewed:: Yes Do you feel safe going back to the place where you live?: Yes      Need for Family Participation in Patient Care: Yes (Comment) Care giver support system in place?: Yes (comment) (adult children)   Criminal Activity/Legal Involvement Pertinent to Current Situation/Hospitalization: No - Comment as needed  Activities of Daily Living      Permission Sought/Granted Permission sought to share information with : Facility Sport and exercise psychologist Permission granted to share information with : Yes, Verbal Permission Granted  Share Information with NAME: [name]     Permission granted to share info w Relationship: daughter  Permission granted to share info w Contact Information: [phone]  Emotional Assessment Appearance:: Other (Comment Required Attitude/Demeanor/Rapport: Other (comment) (unable to assess) Affect (typically observed): Unable to Assess Orientation: : Oriented to Self, Oriented to Place, Oriented to  Situation Alcohol / Substance Use: Not Applicable Psych Involvement: No (comment)  Admission diagnosis:  Bleeding [R58] Generalized headache [R51.9] AKI (acute kidney injury) ([place]) [N17.9] Sepsis ([place]) [A41.9] Nausea and vomiting, unspecified vomiting type [R11.2] Patient Active Problem List   Diagnosis Date Noted   Sepsis ([place]) 04/03/2021   ABLA (acute blood loss anemia) 04/03/2021   AKI (acute kidney injury) ([place]) 04/03/2021   Hypoxia    Drop in hemoglobin    Depression    Hypothyroidism 09/30/2020   Essential hypertension 09/30/2020   Closed left hip fracture, initial encounter ([place]) 09/30/2020   Closed intertrochanteric fracture of left femur ([place]) 09/29/2020   Iron deficiency anemia    Fall    Pneumothorax    Multiple rib fractures 03/01/2019   DNI (do not intubate) 03/01/2019   Dementia, old age, without behavioral disturbance ([place]) 12/27/2017   PCP:  Venia Carbon, MD Pharmacy:   [facility] [place], [place] [address] [place] [address] Phone: [phone] Fax: [phone]     Social Determinants of Health (SDOH) Interventions    Readmission Risk Interventions No flowsheet data found.

## 2021-04-05 NOTE — Plan of Care (Signed)

## 2021-04-06 ENCOUNTER — Telehealth: Payer: Self-pay | Admitting: *Deleted

## 2021-04-06 LAB — CBC
HCT: 25.3 % — ABNORMAL LOW (ref 36.0–46.0)
Hemoglobin: 8.4 g/dL — ABNORMAL LOW (ref 12.0–15.0)
MCH: 29.9 pg (ref 26.0–34.0)
MCHC: 33.2 g/dL (ref 30.0–36.0)
MCV: 90 fL (ref 80.0–100.0)
Platelets: 168 10*3/uL (ref 150–400)
RBC: 2.81 MIL/uL — ABNORMAL LOW (ref 3.87–5.11)
RDW: 13.5 % (ref 11.5–15.5)
WBC: 8.8 10*3/uL (ref 4.0–10.5)
nRBC: 0 % (ref 0.0–0.2)

## 2021-04-06 LAB — BASIC METABOLIC PANEL
Anion gap: 5 (ref 5–15)
BUN: 18 mg/dL (ref 8–23)
CO2: 28 mmol/L (ref 22–32)
Calcium: 8.5 mg/dL — ABNORMAL LOW (ref 8.9–10.3)
Chloride: 100 mmol/L (ref 98–111)
Creatinine, Ser: 0.64 mg/dL (ref 0.44–1.00)
GFR, Estimated: >60 mL/min (ref >60)
Glucose, Bld: 101 mg/dL — ABNORMAL HIGH (ref 70–99)
Potassium: 4.3 mmol/L (ref 3.5–5.1)
Sodium: 133 mmol/L — ABNORMAL LOW (ref 135–145)

## 2021-04-06 LAB — RESP PANEL BY RT-PCR (FLU A&B, COVID) ARPGX2
Influenza A by PCR: NEGATIVE
Influenza B by PCR: NEGATIVE
SARS Coronavirus 2 by RT PCR: NEGATIVE

## 2021-04-06 LAB — MAGNESIUM: Magnesium: 2 mg/dL (ref 1.7–2.4)

## 2021-04-06 NOTE — TOC Transition Note (Signed)
Transition of Care [facility]) - CM/SW Discharge Note   Patient Details  Name: Sonya Carey MRN: [id number] Date of Birth: March 30, 1924  [facility]) CM/SW Contact:  Kerin Salen, RN Phone Number: 04/06/2021, 12:51 PM   Clinical Narrative:  To discharge today to [facility], room 216 via [place]. Daughter notified and accepts discharge decision. Nurse working with patient to call report to [phone].     Final next level of care: Skilled Nursing Facility Barriers to Discharge: Barriers Resolved   Patient Goals and CMS Choice     Choice offered to / list presented to : Adult Children  Discharge Placement              Patient chooses bed at: [facility] (Room 216)   Name of family member notified: Daughter Leslie Dales Patient and family notified of of transfer: 04/06/21  Discharge Plan and Services                                     Social Determinants of Health (SDOH) Interventions     Readmission Risk Interventions No flowsheet data found.

## 2021-04-06 NOTE — Care Management Important Message (Signed)
Important Message  Patient Details  Name: Sonya Carey MRN: [id number] Date of Birth: 1925/03/06   Medicare Important Message Given:  N/A - LOS <3 / Initial given by admissions     [name] 04/06/2021, 10:31 AM

## 2021-04-06 NOTE — Telephone Encounter (Signed)
I spoke to the daughter already today and will see the patient tomorrow when she is back at [facility]

## 2021-04-06 NOTE — Telephone Encounter (Signed)
PLEASE NOTE: All timestamps contained within this report are represented as [place] Standard Time. CONFIDENTIALTY NOTICE: This fax transmission is intended only for the addressee. It contains information that is legally privileged, confidential or otherwise protected from use or disclosure. If you are not the intended recipient, you are strictly prohibited from reviewing, disclosing, copying using or disseminating any of this information or taking any action in reliance on or regarding this information. If you have received this fax in error, please notify us immediately by telephone so that we can arrange for its return to [place]. Phone: [phone], Toll-Free: [phone], Fax: [phone] Page: 1 of 2 Call Id: [id number] [place] Night - Client TELEPHONE ADVICE RECORD Access[employer]urse Patient Name: Sonya Carey Gender: Female DOB: [birth year] Age: 88 Y 2 M 16 D Return Phone Number: [phone] (Primary) Address: City/ State/ Zip: [place]  [address] Client [facility] Night - Client Client Site [place] Provider Viviana Simpler- MD Contact Type Call Who Is Calling Patient / Member / Family / Caregiver Call Type Triage / Clinical Caller Name Barbaraann Barthel Relationship To Patient Daughter Return Phone Number [phone] (Primary) Chief Complaint Health information question (non symptomatic) Reason for Call Request to Speak to a Physician Initial Comment Caller states their mom is hospitalized and has had severe bleeding and is concerned about her release from the hospital. It was a cut in her eardrum and they are releasing her because there is no ENT at the hospital. Translation No No Triage Reason Other Nurse Assessment Nurse: Martyn Ehrich, RN, Solmon Ice Date/Time (Eastern Time): 04/05/2021 2:05:42 PM Confirm and document reason for call. If symptomatic, describe symptoms. ---Caller is not with pt.  PT is in hospital and had a lot of bleeding from an ear (they are not sure why) and hematocrit went from 6.9 to 8.2 after transfusion last night and they are about to discharge her and dtr wants to appeal and then MD said she can stay until tomorrow. However they said they would release her if hemaglobin was 7 and that is frightening her bc this was a lower no. than they gave her previously (8). Told her since she is not with her we cant really assess and the hospital is in charge of her right now. She is going to fly into town. Pt will not discharge until tom Does the patient have any new or worsening symptoms? ---Yes Will a triage be completed? ---No [place] reason for no triage. ---Other Please document clinical information provided and list any resource used. ---cannot triage pt is not with her and she is under care of hospital - told her call back tomorrow - Mom says they want her to go to ENT when she is discharged. She was so confused why the MD didnt know about what happened to the ear at the ER - she has poor short term memory and stays in [place]: All timestamps contained within this report are represented as [place] Standard Time. CONFIDENTIALTY NOTICE: This fax transmission is intended only for the addressee. It contains information that is legally privileged, confidential or otherwise protected from use or disclosure. If you are not the intended recipient, you are strictly prohibited from reviewing, disclosing, copying using or disseminating any of this information or taking any action in reliance on or regarding this information. If you have received this fax in error, please notify us immediately by telephone so that we can  arrange for its return to [place]. Phone: [phone], Toll-Free: [phone], Fax: [phone] Page: 2 of 2 Call Id: [id number] [place]. Time Eilene Ghazi Time) Disposition Final User 04/05/2021 2:23:21 PM Clinical Call Yes  Martyn Ehrich, RN, Solmon Ice

## 2021-04-06 NOTE — Progress Notes (Signed)
Contacted [phone]. Reported to facility nurse, Casey Burkitt.

## 2021-04-06 NOTE — Progress Notes (Signed)
Updated daughter Wendelyn Breslow about her mom's hemoglobin and vitals. Was advised to call this nurse back in a few hours for her morning labs.

## 2021-04-06 NOTE — Plan of Care (Signed)
  Problem: Health Behavior/Discharge Planning: Goal: Ability to manage health-related needs will improve Outcome: Progressing   Problem: Clinical Measurements: Goal: Ability to maintain clinical measurements within normal limits will improve Outcome: Progressing   Problem: Clinical Measurements: Goal: Will remain free from infection Outcome: Progressing   

## 2021-04-06 NOTE — TOC Progression Note (Signed)
Transition of Care [facility]) - Progression Note    Patient Details  Name: Sonya Carey MRN: [id number] Date of Birth: September 08, 1924  Transition of Care [facility]) CM/SW [place], RN Phone Number: 04/06/2021, 9:53 AM  Clinical Narrative:  Called and spoke with Daughter who is agreeing with discharge, however want to speak with Attending around 2pm. I will call [facility] to arrange discharge. Attending notified and will call daughter. Notified [facility] about patient being medically stable for discharge waiting of room number to arrange transportation.    Expected Discharge Plan: Long Term Nursing Home Barriers to Discharge: Continued Medical Work up  Expected Discharge Plan and Services Expected Discharge Plan: [place]       Living arrangements for the past 2 months: [place] Expected Discharge Date: 04/06/21                                     Social Determinants of Health (SDOH) Interventions    Readmission Risk Interventions No flowsheet data found.

## 2021-04-06 NOTE — Progress Notes (Signed)
Attempted to reach [place] [facility] 3 times for report.  Attempted calls not successful at this time.

## 2021-04-06 NOTE — Progress Notes (Signed)
EMS transported pt from [facility] to [facility].

## 2021-04-06 NOTE — Progress Notes (Signed)
Patient has been pleasant during the shift. Alert to self. Confused at situation, time and place. Was able to tell me that she was in the hospital at beginning of shift. Takes meds whole one at a time very slowly. [place] in place and working appropriately. Patient coughs and deep breaths. Small amount of dried blood noted to covers in bed. Could not find source of bleed. Patient noted to keep removing her oxygen from her nose and blowing her nose which may account for the dried blood on the covers. Patient often asks about her husband and her daughter Sonya Carey. Updated Robin earlier in the shift of her mom's status. Says she will call back in the AM for her lab work. Visual contacts given to patient often. Bed alarm on, bed in lowest position, Hob elevated. Call light within reach. Belongings within reach.

## 2021-04-07 DIAGNOSIS — D649 Anemia, unspecified: Secondary | ICD-10-CM | POA: Diagnosis not present

## 2021-04-07 DIAGNOSIS — H60322 Hemorrhagic otitis externa, left ear: Secondary | ICD-10-CM | POA: Diagnosis not present

## 2021-04-11 LAB — CULTURE, BLOOD (SINGLE)
Culture: NO GROWTH
Special Requests: ADEQUATE

## 2021-04-14 ENCOUNTER — Ambulatory Visit: Payer: Medicare Other | Admitting: Internal Medicine

## 2021-05-06 DIAGNOSIS — H6122 Impacted cerumen, left ear: Secondary | ICD-10-CM | POA: Diagnosis not present

## 2021-05-20 DIAGNOSIS — E039 Hypothyroidism, unspecified: Secondary | ICD-10-CM

## 2021-05-20 DIAGNOSIS — G309 Alzheimer's disease, unspecified: Secondary | ICD-10-CM | POA: Diagnosis not present

## 2021-05-20 DIAGNOSIS — D649 Anemia, unspecified: Secondary | ICD-10-CM

## 2021-05-20 DIAGNOSIS — F22 Delusional disorders: Secondary | ICD-10-CM | POA: Diagnosis not present

## 2021-05-20 DIAGNOSIS — F39 Unspecified mood [affective] disorder: Secondary | ICD-10-CM | POA: Diagnosis not present

## 2021-05-20 DIAGNOSIS — M180 Bilateral primary osteoarthritis of first carpometacarpal joints: Secondary | ICD-10-CM | POA: Diagnosis not present

## 2021-05-20 DIAGNOSIS — E43 Unspecified severe protein-calorie malnutrition: Secondary | ICD-10-CM

## 2021-05-20 DIAGNOSIS — M1990 Unspecified osteoarthritis, unspecified site: Secondary | ICD-10-CM

## 2021-05-20 DIAGNOSIS — I10 Essential (primary) hypertension: Secondary | ICD-10-CM

## 2021-07-16 DIAGNOSIS — F22 Delusional disorders: Secondary | ICD-10-CM | POA: Diagnosis not present

## 2021-07-16 DIAGNOSIS — G301 Alzheimer's disease with late onset: Secondary | ICD-10-CM | POA: Diagnosis not present

## 2021-07-16 DIAGNOSIS — M810 Age-related osteoporosis without current pathological fracture: Secondary | ICD-10-CM | POA: Diagnosis not present

## 2021-07-16 DIAGNOSIS — F39 Unspecified mood [affective] disorder: Secondary | ICD-10-CM | POA: Diagnosis not present

## 2021-07-16 DIAGNOSIS — E039 Hypothyroidism, unspecified: Secondary | ICD-10-CM

## 2021-08-13 IMAGING — DX DG CHEST 1V
1 series · 1 of 1 positions shown · non-contrast
Comparison: 03/03/2019,

CLINICAL DATA: Unwitnessed fall

EXAM:
CHEST  1 VIEW

[chest ap]
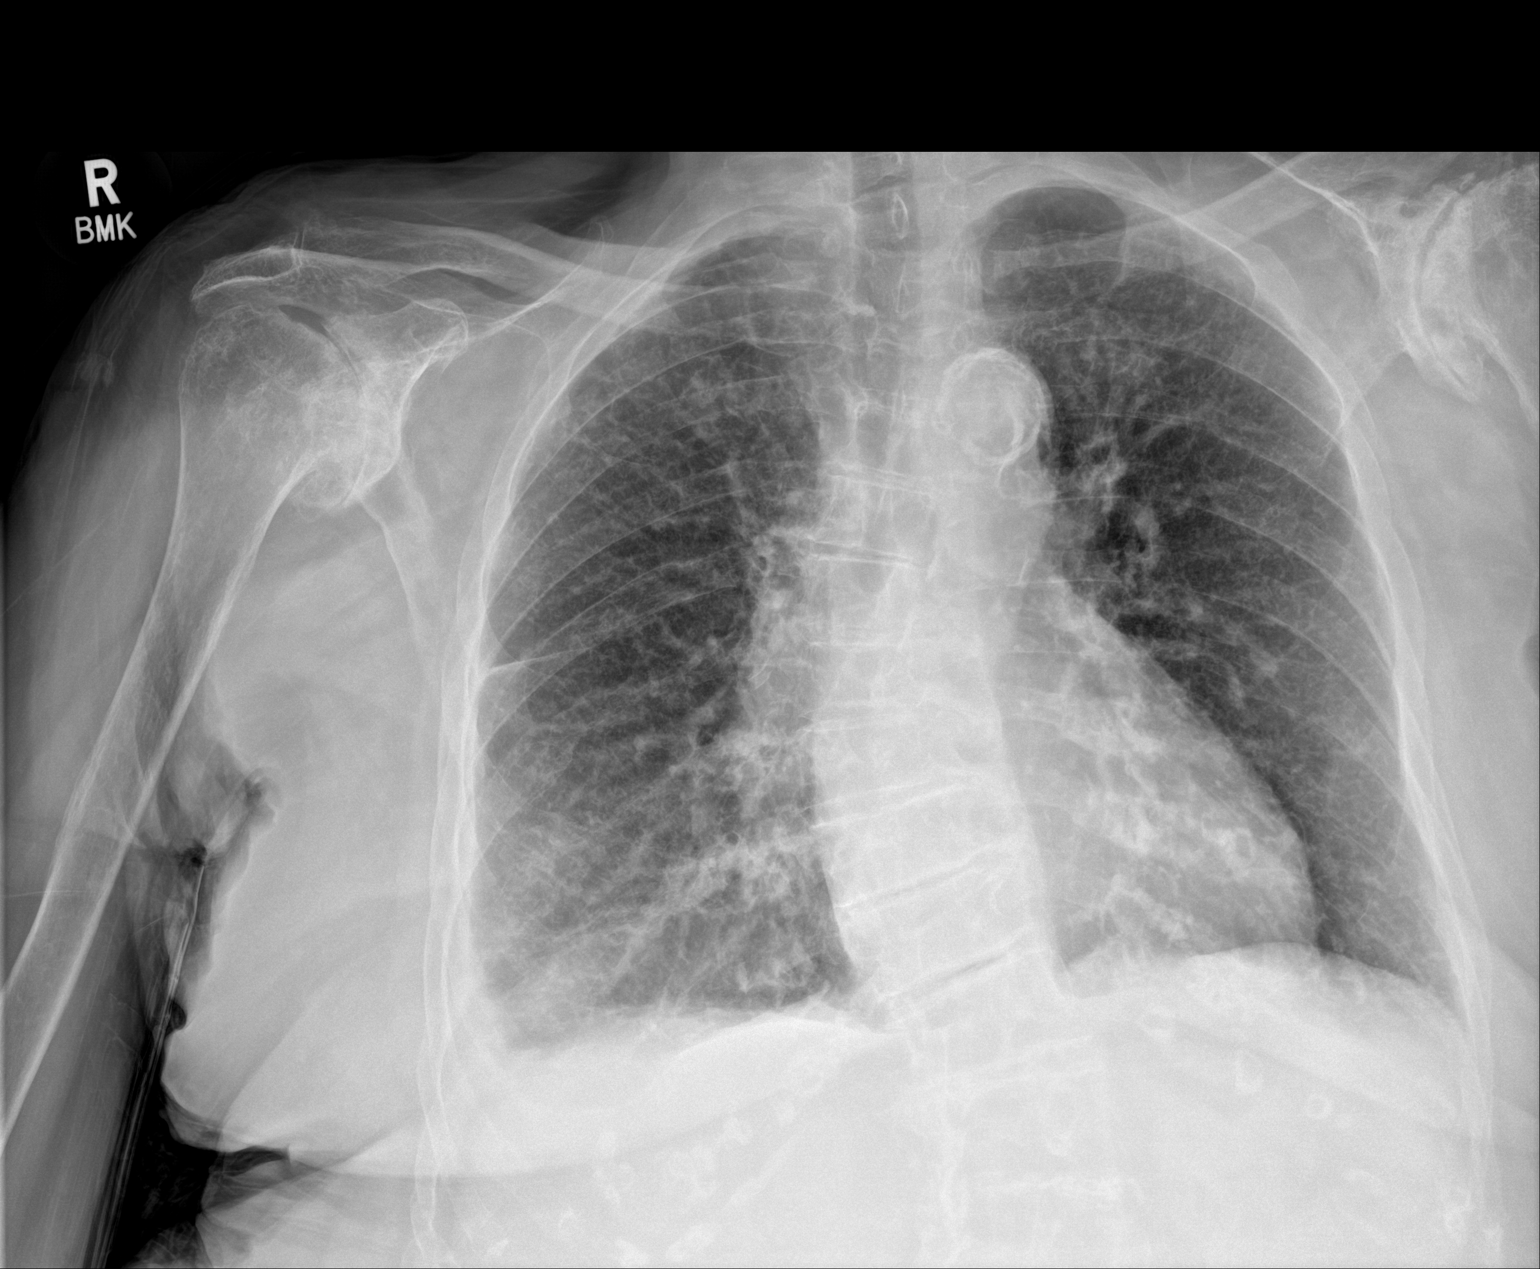

[1 of 1 positions shown; findings below may reference images not displayed]

FINDINGS: Small right-sided pleural effusion with hazy atelectasis right base.
Diffuse coarse chronic interstitial opacity. Normal cardiac size.
Aortic atherosclerosis. No pneumothorax. Scoliosis. Severe
degenerative changes of both shoulders.
IMPRESSION: Coarse chronic interstitial opacity with small right pleural
effusion.

## 2021-08-13 IMAGING — DX DG HIP (WITH OR WITHOUT PELVIS) 2-3V*L*
3 series · 3 of 3 positions shown · non-contrast
Comparison: CT 03/04/2019

CLINICAL DATA: Fall

EXAM:
DG HIP (WITH OR WITHOUT PELVIS) 2-3V LEFT

[pelvis ap]
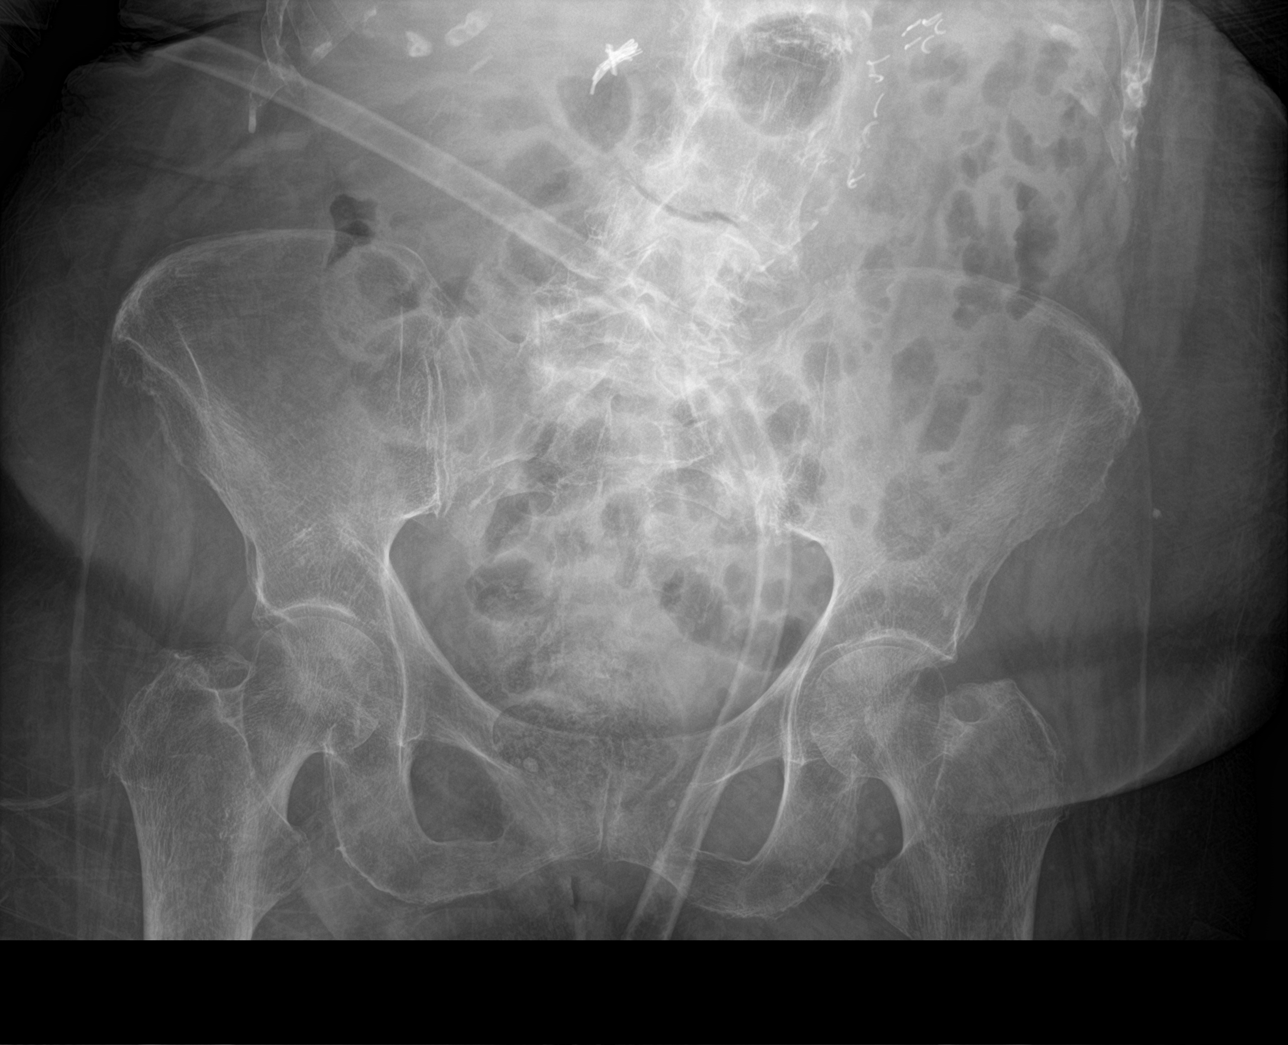

[hip ap]
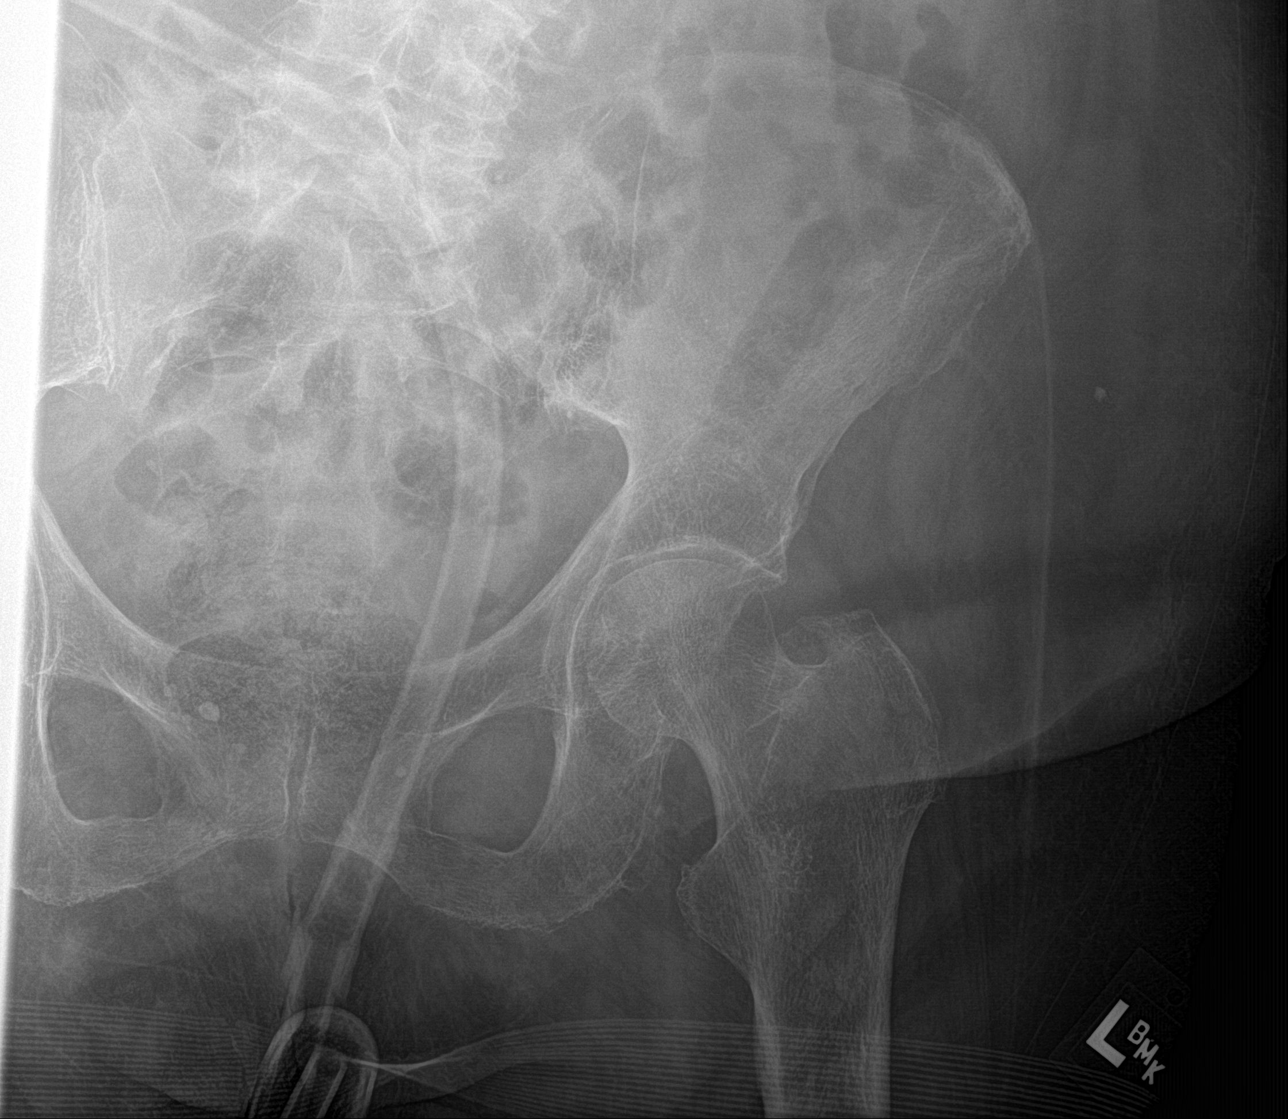

[hip lat]
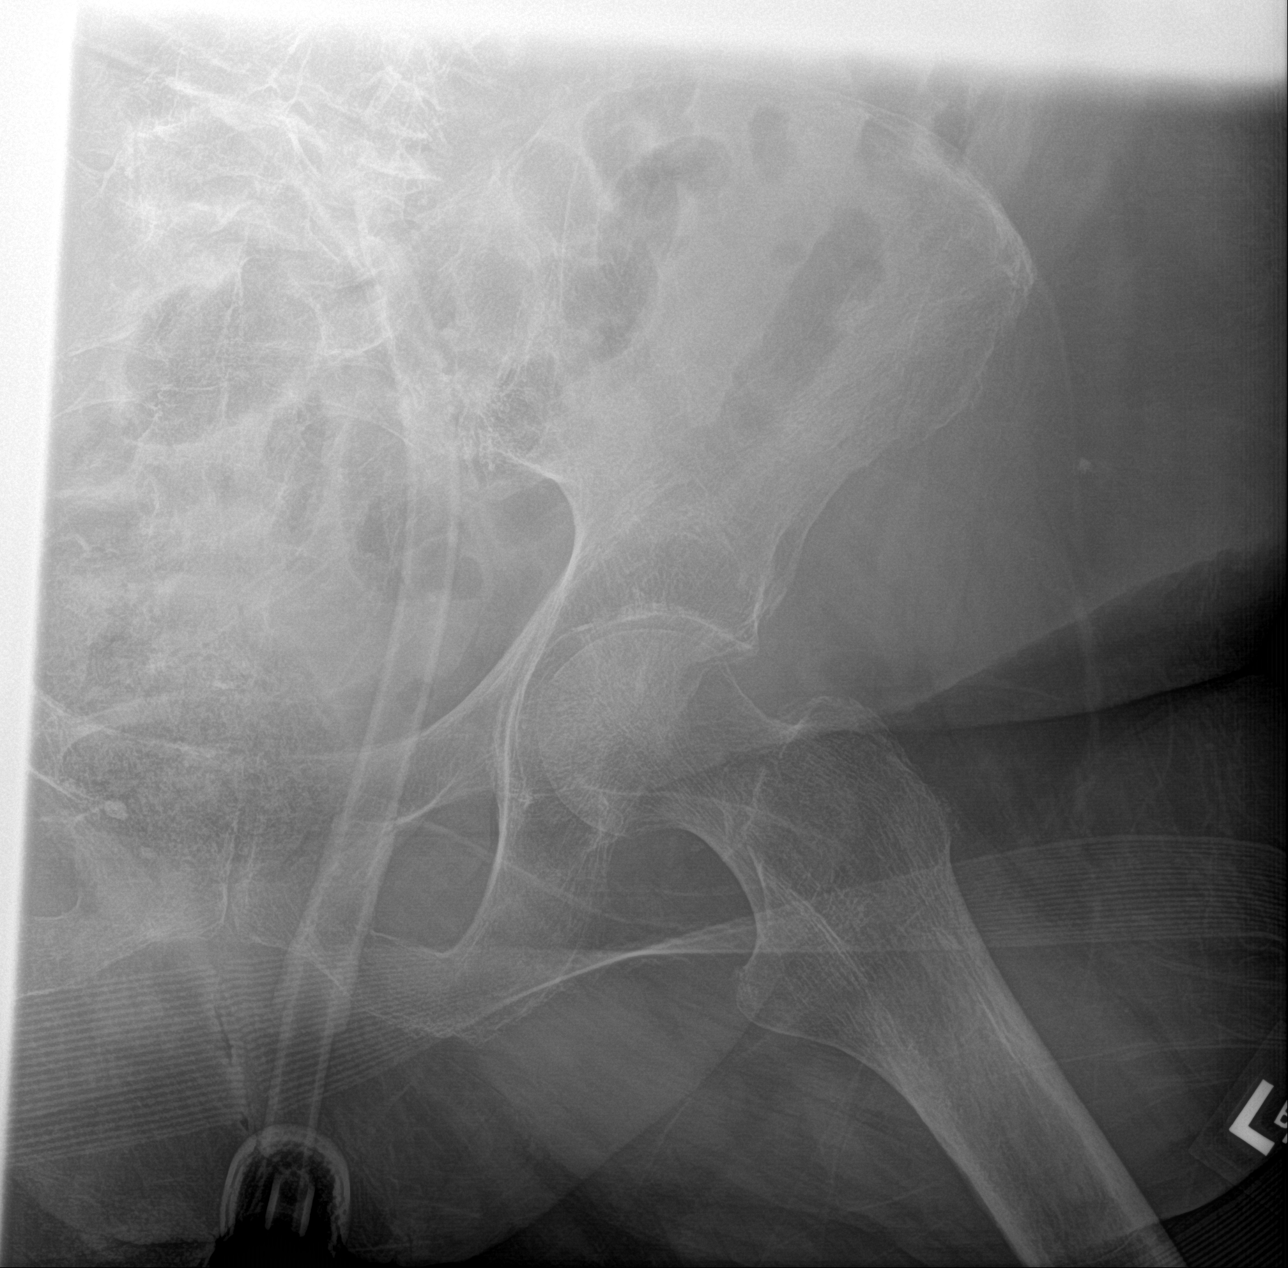

[3 of 3 positions shown; findings below may reference images not displayed]

FINDINGS: SI joints are non widened. Pubic symphysis and rami are intact.
There is questionable lucency at the greater trochanter of left
femur on one view
IMPRESSION: Questionable lucency at the greater trochanter of left femur.
Suggest CT for further evaluation

## 2021-08-31 DIAGNOSIS — L03116 Cellulitis of left lower limb: Secondary | ICD-10-CM | POA: Diagnosis not present

## 2021-09-30 DIAGNOSIS — H353211 Exudative age-related macular degeneration, right eye, with active choroidal neovascularization: Secondary | ICD-10-CM

## 2021-09-30 DIAGNOSIS — M810 Age-related osteoporosis without current pathological fracture: Secondary | ICD-10-CM

## 2021-09-30 DIAGNOSIS — I10 Essential (primary) hypertension: Secondary | ICD-10-CM

## 2021-09-30 DIAGNOSIS — D649 Anemia, unspecified: Secondary | ICD-10-CM

## 2021-09-30 DIAGNOSIS — E039 Hypothyroidism, unspecified: Secondary | ICD-10-CM

## 2021-09-30 DIAGNOSIS — E43 Unspecified severe protein-calorie malnutrition: Secondary | ICD-10-CM | POA: Diagnosis not present

## 2021-09-30 DIAGNOSIS — F39 Unspecified mood [affective] disorder: Secondary | ICD-10-CM | POA: Diagnosis not present

## 2021-09-30 DIAGNOSIS — G309 Alzheimer's disease, unspecified: Secondary | ICD-10-CM | POA: Diagnosis not present

## 2021-09-30 DIAGNOSIS — F22 Delusional disorders: Secondary | ICD-10-CM | POA: Diagnosis not present

## 2021-10-13 DIAGNOSIS — L97921 Non-pressure chronic ulcer of unspecified part of left lower leg limited to breakdown of skin: Secondary | ICD-10-CM | POA: Diagnosis not present

## 2021-10-13 DIAGNOSIS — L03116 Cellulitis of left lower limb: Secondary | ICD-10-CM | POA: Diagnosis not present

## 2021-11-06 DIAGNOSIS — S51812A Laceration without foreign body of left forearm, initial encounter: Secondary | ICD-10-CM | POA: Diagnosis not present

## 2021-11-26 DIAGNOSIS — F39 Unspecified mood [affective] disorder: Secondary | ICD-10-CM | POA: Diagnosis not present

## 2021-11-26 DIAGNOSIS — F22 Delusional disorders: Secondary | ICD-10-CM | POA: Diagnosis not present

## 2021-11-26 DIAGNOSIS — G301 Alzheimer's disease with late onset: Secondary | ICD-10-CM | POA: Diagnosis not present

## 2021-11-26 DIAGNOSIS — E039 Hypothyroidism, unspecified: Secondary | ICD-10-CM

## 2021-11-26 DIAGNOSIS — M810 Age-related osteoporosis without current pathological fracture: Secondary | ICD-10-CM | POA: Diagnosis not present

## 2021-12-29 ENCOUNTER — Encounter: Payer: Self-pay | Admitting: Nurse Practitioner

## 2021-12-29 ENCOUNTER — Non-Acute Institutional Stay (SKILLED_NURSING_FACILITY): Payer: Medicare Other | Admitting: Nurse Practitioner

## 2021-12-29 DIAGNOSIS — D509 Iron deficiency anemia, unspecified: Secondary | ICD-10-CM | POA: Diagnosis not present

## 2021-12-29 DIAGNOSIS — F02C3 Dementia in other diseases classified elsewhere, severe, with mood disturbance: Secondary | ICD-10-CM

## 2021-12-29 DIAGNOSIS — F330 Major depressive disorder, recurrent, mild: Secondary | ICD-10-CM

## 2021-12-29 DIAGNOSIS — E039 Hypothyroidism, unspecified: Secondary | ICD-10-CM

## 2021-12-29 DIAGNOSIS — G301 Alzheimer's disease with late onset: Secondary | ICD-10-CM

## 2021-12-29 DIAGNOSIS — L03114 Cellulitis of left upper limb: Secondary | ICD-10-CM

## 2021-12-29 NOTE — Progress Notes (Signed)
Location:   [place] Room Number: 216 A Place of Service:  ALF [phone]) Provider:  Sherrie Mustache, NP  Dewayne Shorter, MD  Patient Care Team: Dewayne Shorter, MD as PCP - General (Family Medicine)  Extended Emergency Contact Information Primary Emergency Contact: [place] of [place] Mobile Phone: [phone] Relation: Daughter Secondary Emergency Contact: Kirks,fred Mobile Phone: [phone] Relation: Son  Code Status:  DNR Goals of care: Advanced Directive information    12/29/2021    4:24 PM  Advanced Directives  Does Patient Have a Medical Advance Directive? Yes  Type of Paramedic of [place];Living will;Out of facility DNR (pink MOST or yellow form)  Does patient want to make changes to medical advance directive? No - Patient declined  Copy of [place] in Chart? Yes - validated most recent copy scanned in chart (See row information)  Pre-existing out of facility DNR order (yellow form or pink MOST form) Yellow form placed in chart (order not valid for inpatient use)     Chief Complaint  Patient presents with   Medical Management of Chronic Issues    Routine follow up visit   Immunizations    Covid vaccine, zoster vaccine, pneumonia vaccine, and flu vaccine due.   Quality Metric Gaps    Dexa scan due    HPI:  Pt is a 88 y.o. female seen today for medical management of chronic diseases.   Pt with hx of dementia, delusional disorder, mood disorder, osteoporosis, hypothyroid.  She suffered significant skin tear to her left arm but she does not leave the dressing on and picks at scabs to left arm. Nursing now concerned due to redness and warmth to left arm.  Daughter also request that her ears to be examined.     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   Depression    Fracture, metacarpal 06/2016    5th metacarpal   History of chicken pox    Hypertension    Hypothyroidism    Macular degeneration    left eye   Thyroid disease    Past Surgical History:  Procedure Laterality Date   CARPAL TUNNEL RELEASE  12/09/2010   CHOLECYSTECTOMY  1995   COLON SURGERY  02/25/2010   transverse colon resection   COLONOSCOPY     ESOPHAGOGASTRODUODENOSCOPY  02/25/2010   GANGLION CYST EXCISION Right 07/26/2016   Procedure: REMOVAL GANGLION OF WRIST;  Surgeon: Dereck Leep, MD;  Location: [facility] ORS;  Service: Orthopedics;  Laterality: Right;   GANGLION CYST EXCISION Right 11/10/2016   Procedure: REMOVAL GANGLION OF WRIST;  Surgeon: Dereck Leep, MD;  Location: [facility] ORS;  Service: Orthopedics;  Laterality: Right;   HERNIA REPAIR     INTRAMEDULLARY (IM) NAIL INTERTROCHANTERIC Left 09/30/2020   Procedure: INTRAMEDULLARY (IM) NAIL INTERTROCHANTRIC;  Surgeon: Leim Fabry, MD;  Location: [facility] ORS;  Service: Orthopedics;  Laterality: Left;   MULTIPLE TOOTH EXTRACTIONS  06/2015   for dentures   UMBILICAL HERNIA REPAIR  02/25/2010    Allergies  Allergen Reactions   Celebrex [Celecoxib] Other (See Comments)    GI upset   Latex Itching    NEGATIVE LATEX IgE (< 0.10) 11/04/2016   Statins Other (See Comments)    Muscle pain   Adhesive [Tape] Rash    Allergies as of 12/29/2021       Reactions   Celebrex [celecoxib] Other (See Comments)  GI upset   Latex Itching   NEGATIVE LATEX IgE (< 0.10) 11/04/2016   Statins Other (See Comments)   Muscle pain   Adhesive [tape] Rash        Medication List        Accurate as of December 29, 2021  4:26 PM. If you have any questions, ask your nurse or doctor.          STOP taking these medications    cetirizine 10 MG tablet Commonly known as: ZYRTEC Stopped by: Lauree Chandler, NP   diclofenac Sodium 1 % Gel Commonly known as: VOLTAREN Stopped by: Lauree Chandler, NP   lidocaine 4 % Stopped by: Lauree Chandler, NP       TAKE  these medications    acetaminophen 325 MG tablet Commonly known as: TYLENOL Take 650 mg by mouth 3 (three) times daily.   alendronate 70 MG tablet Commonly known as: Fosamax Take 1 tablet (70 mg total) by mouth once a week. Take with a full glass of water on an empty stomach.   calcium carbonate 500 MG chewable tablet Commonly known as: TUMS - dosed in mg elemental calcium Chew 2 tablets (400 mg of elemental calcium total) by mouth 2 (two) times daily.   CHLORHEXIDINE GLUCONATE (BULK) Soln 15 mLs by Does not apply route in the morning and at bedtime.   cholecalciferol 25 MCG (1000 UNIT) tablet Commonly known as: VITAMIN D3 Take 1,000 Units by mouth daily.   docusate sodium 100 MG capsule Commonly known as: COLACE Take 1 capsule (100 mg total) by mouth 2 (two) times daily as needed for mild constipation.   donepezil 10 MG tablet Commonly known as: ARICEPT Take 10 mg by mouth at bedtime.   DOXYCYCLINE PO Take 1 tablet by mouth 2 (two) times daily.   ferrous sulfate 325 (65 FE) MG tablet Take 1 tablet (325 mg total) by mouth daily with breakfast.   hydroxypropyl methylcellulose / hypromellose 2.5 % ophthalmic solution Commonly known as: ISOPTO TEARS / GONIOVISC Place 1 drop into both eyes 2 (two) times daily as needed for dry eyes.   lactase 3000 units tablet Commonly known as: LACTAID Take 6,000 Units by mouth 3 (three) times daily with meals.   magnesium hydroxide 400 MG/5ML suspension Commonly known as: MILK OF MAGNESIA Take 30 mLs by mouth daily as needed for mild constipation.   memantine 10 MG tablet Commonly known as: NAMENDA Take 10 mg by mouth 2 (two) times daily.   Multivitamin Adult Tabs Take 1 tablet by mouth daily. SHAKLEE "LIFE STRIP" MULTIVITAMIN PACK   NP Thyroid 15 MG tablet Generic drug: thyroid Take 15 mg by mouth every morning.   NP Thyroid 60 MG tablet Generic drug: thyroid Take 60 mg by mouth in the morning.   nystatin powder Commonly  known as: MYCOSTATIN/NYSTOP Apply 1 Application topically every 12 (twelve) hours as needed.   olopatadine 0.1 % ophthalmic solution Commonly known as: PATANOL Place 1 drop into the left eye daily.   PreserVision AREDS 2 Caps Take 1 tablet by mouth in the morning and at bedtime.   sertraline 100 MG tablet Commonly known as: ZOLOFT Take 150 mg by mouth at bedtime.   triamcinolone cream 0.1 % Commonly known as: KENALOG Apply 1 Application topically every 12 (twelve) hours as needed.   Tussin DM 10-100 MG/5ML liquid Generic drug: dextromethorphan-guaiFENesin Take 10 mLs by mouth every 4 (four) hours as needed for cough.  Review of Systems  Unable to perform ROS: Dementia    Immunization History  Administered Date(s) Administered   Influenza-Unspecified 12/28/2011, 01/16/2013, 02/02/2014, 12/28/2014, 01/14/2015, 12/30/2015, 12/23/2016   Moderna Sars-Covid-2 Vaccination 03/26/2019, 04/23/2019   PPD Test 12/20/2016   Tdap 12/28/2013   Unspecified SARS-COV-2 Vaccination 08/11/2021   Pertinent  Health Maintenance Due  Topic Date Due   DEXA SCAN  Never done   INFLUENZA VACCINE  10/13/2021      04/03/2021    8:38 PM 04/04/2021    8:00 AM 04/04/2021    8:46 PM 04/05/2021    9:00 PM 04/06/2021   11:00 AM  Fall Risk  Patient Fall Risk Level High fall risk High fall risk High fall risk High fall risk High fall risk   Functional Status Survey:    Vitals:   12/29/21 1604  BP: 121/69  Pulse: 79  Resp: 18  SpO2: 96%  Height: '5\' 3"'$  (1.6 m)   Body mass index is 26.22 kg/m. Physical Exam Constitutional:      General: She is not in acute distress.    Appearance: She is well-developed. She is not diaphoretic.  HENT:     Head: Normocephalic and atraumatic.     Right Ear: Tympanic membrane, ear canal and external ear normal.     Left Ear: Tympanic membrane, ear canal and external ear normal.     Mouth/Throat:     Pharynx: No oropharyngeal exudate.  Eyes:      Conjunctiva/sclera: Conjunctivae normal.     Pupils: Pupils are equal, round, and reactive to light.  Cardiovascular:     Rate and Rhythm: Normal rate and regular rhythm.     Heart sounds: Normal heart sounds.  Pulmonary:     Effort: Pulmonary effort is normal.     Breath sounds: Normal breath sounds.  Abdominal:     General: Bowel sounds are normal.     Palpations: Abdomen is soft.  Musculoskeletal:     Cervical back: Normal range of motion and neck supple.     Right lower leg: No edema.     Left lower leg: No edema.     Comments: Left forearm red and warm. Excoriation noted around healing skin tear.   Skin:    General: Skin is warm and dry.  Neurological:     Mental Status: She is alert.  Psychiatric:        Mood and Affect: Mood normal.     Labs reviewed: Recent Labs    04/04/21 0532 04/05/21 0424 04/06/21 0538  NA 133* 135 133*  K 4.3 4.1 4.3  CL 99 101 100  CO2 '27 27 28  '$ GLUCOSE 89 94 101*  BUN '19 17 18  '$ CREATININE 1.18* 0.87 0.64  CALCIUM 8.1* 8.4* 8.5*  MG  --  1.8 2.0   Recent Labs    04/03/21 0651  AST 25  ALT 11  ALKPHOS 92  BILITOT 0.5  PROT 6.8  ALBUMIN 3.2*   Recent Labs    04/03/21 0650 04/03/21 1454 04/04/21 0532 04/04/21 1125 04/05/21 0424 04/06/21 0538  WBC 16.9*  --  8.1  --  8.3 8.8  NEUTROABS 12.1*  --   --   --   --   --   HGB 11.1*   < > 6.9* 6.8* 8.2* 8.4*  HCT 33.4*   < > 20.3* 20.6* 24.0* 25.3*  MCV 89.8  --  89.8  --  90.9 90.0  PLT 382  --  190  --  162 168   < > = values in this interval not displayed.   Lab Results  Component Value Date   TSH 10.059 (H) 03/01/2019   No results found for: "HGBA1C" No results found for: "CHOL", "HDL", "LDLCALC", "LDLDIRECT", "TRIG", "CHOLHDL"  Significant Diagnostic Results in last 30 days:  No results found.  Assessment/Plan 1. Severe late onset Alzheimer's dementia with mood disturbance (HCC) -Stable, no acute changes in cognitive or functional status, continue supportive  care.   2. Hypothyroidism, unspecified type Free t4 in normal range on labs in May, continues on NP thyroid 75 mg daily  3. Iron deficiency anemia, unspecified iron deficiency anemia type -will follo up cbc, continues on iron supplements.   4. Cellulitis of left upper extremity -add doxycycline 100 mg PO BID for 7 days -continue wound care for skin tear and to keep area covered to over patient irritating/picking area.   5. Mild episode of recurrent major depressive disorder ([place]) -controlled on zoloft 150 mg by mouth daily   Gardy Montanari K. [place], [facility] [phone]

## 2021-12-31 LAB — CBC AND DIFFERENTIAL
HCT: 37 (ref 36–46)
Hemoglobin: 12.4 (ref 12.0–16.0)
Platelets: 247 10*3/uL (ref 150–400)
WBC: 7

## 2021-12-31 LAB — COMPREHENSIVE METABOLIC PANEL
Albumin: 3.5 (ref 3.5–5.0)
Calcium: 8.3 — AB (ref 8.7–10.7)
Globulin: 3.5
eGFR: 67

## 2021-12-31 LAB — HEPATIC FUNCTION PANEL
ALT: 8 U/L (ref 7–35)
AST: 19 (ref 13–35)
Alkaline Phosphatase: 82 (ref 25–125)
Bilirubin, Total: 0.4

## 2021-12-31 LAB — BASIC METABOLIC PANEL
BUN: 8 (ref 4–21)
CO2: 27 — AB (ref 13–22)
Chloride: 99 (ref 99–108)
Creatinine: 0.8 (ref 0.5–1.1)
Glucose: 75
Potassium: 4.4 mEq/L (ref 3.5–5.1)
Sodium: 131 — AB (ref 137–147)

## 2021-12-31 LAB — CBC: RBC: 4.31 (ref 3.87–5.11)

## 2022-02-15 IMAGING — DX DG CHEST 1V PORT
1 series · 1 of 1 positions shown · non-contrast
Comparison: 09/29/2020

CLINICAL DATA: Questionable sepsis.

EXAM:
PORTABLE CHEST 1 VIEW

[chest ap]
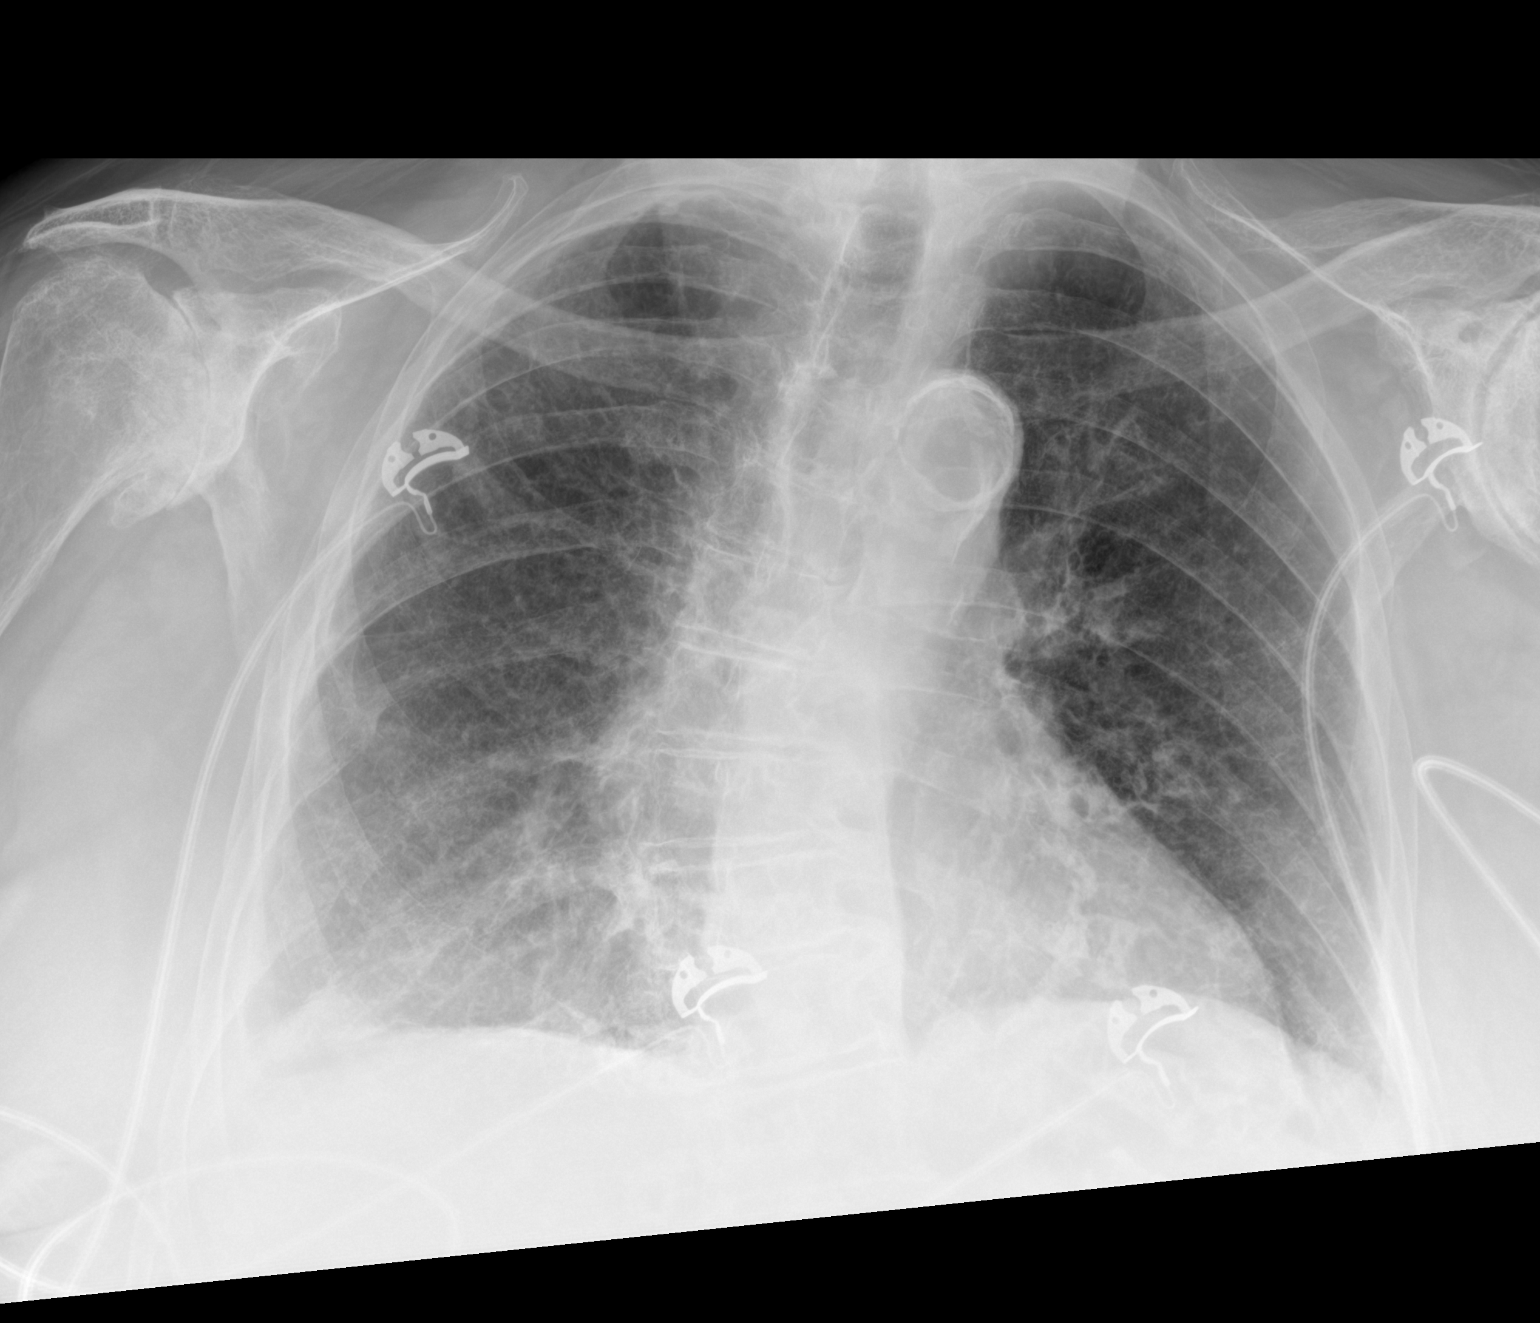

[1 of 1 positions shown; findings below may reference images not displayed]

FINDINGS: Stable cardiomediastinal contours. Aortic atherosclerotic
calcifications noted. Chronic pleuroparenchymal scarring is
identified within the periphery of the right lung base. Small right
pleural effusion suspected. Opacity overlying the right
hemidiaphragm appears new and may represent an area of atelectasis
or infiltrate. Diffusely increased interstitial markings appear
similar to the previous exam. Marked bilateral glenohumeral joint
osteoarthritis. Thoracic scoliosis appears convex towards the right.
IMPRESSION: 1. New right base opacity which may represent atelectasis or
infiltrate.
2. Suspect small right pleural effusion.
3. Stable chronic interstitial lung disease.

## 2022-03-09 ENCOUNTER — Encounter: Payer: Self-pay | Admitting: Adult Health

## 2022-03-09 ENCOUNTER — Non-Acute Institutional Stay (SKILLED_NURSING_FACILITY): Payer: Medicare Other | Admitting: Adult Health

## 2022-03-09 DIAGNOSIS — D509 Iron deficiency anemia, unspecified: Secondary | ICD-10-CM

## 2022-03-09 DIAGNOSIS — R059 Cough, unspecified: Secondary | ICD-10-CM

## 2022-03-09 DIAGNOSIS — G301 Alzheimer's disease with late onset: Secondary | ICD-10-CM

## 2022-03-09 DIAGNOSIS — F02C3 Dementia in other diseases classified elsewhere, severe, with mood disturbance: Secondary | ICD-10-CM | POA: Diagnosis not present

## 2022-03-09 NOTE — Progress Notes (Signed)
Location:   [place] Room Number: 216 A Place of Service:  SNF ([phone]) Provider:  Durenda Age, NP  Dewayne Shorter, MD  Patient Care Team: Dewayne Shorter, MD as PCP - General (Family Medicine)  Extended Emergency Contact Information Primary Emergency Contact: [place] of [address] Mobile Phone: [phone] Relation: Daughter Secondary Emergency Contact: Bibian,fred Mobile Phone: [phone] Relation: Son  Code Status:  DNR Goals of care: Advanced Directive information    03/09/2022    1:45 PM  Advanced Directives  Does Patient Have a Medical Advance Directive? Yes  Type of Paramedic of [place];Living will;Out of facility DNR (pink MOST or yellow form)  Does patient want to make changes to medical advance directive? No - Patient declined  Copy of [place] in Chart? Yes - validated most recent copy scanned in chart (See row information)  Pre-existing out of facility DNR order (yellow form or pink MOST form) Yellow form placed in chart (order not valid for inpatient use)     Chief Complaint  Patient presents with   Acute Visit    Bad cough    HPI:  Pt is a 88 y.o. female seen today for an acute visit for an acute visit regarding cough. She was negative for COVID-19 test today. She currently takes Mucinex 600 mg  Q 12 hours PRN and Robitussin syrup Q 4 hours PRN. There was no reported fever nor chills.   Review of medications noted that she takes FeSO4 325 mg daily for anemia. Latest hgb 12.4, 12/31/21.    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[facility])    adenocarcinoma of transverse colon   Depression    Fracture, metacarpal 06/2016   5th metacarpal   History of chicken pox    Hypertension    Hypothyroidism    Macular degeneration    left eye   Thyroid disease    Past Surgical History:  Procedure Laterality Date   CARPAL TUNNEL  RELEASE  12/09/2010   CHOLECYSTECTOMY  1995   COLON SURGERY  02/25/2010   transverse colon resection   COLONOSCOPY     ESOPHAGOGASTRODUODENOSCOPY  02/25/2010   GANGLION CYST EXCISION Right 07/26/2016   Procedure: REMOVAL GANGLION OF WRIST;  Surgeon: Dereck Leep, MD;  Location: [facility] ORS;  Service: Orthopedics;  Laterality: Right;   GANGLION CYST EXCISION Right 11/10/2016   Procedure: REMOVAL GANGLION OF WRIST;  Surgeon: Dereck Leep, MD;  Location: [facility] ORS;  Service: Orthopedics;  Laterality: Right;   HERNIA REPAIR     INTRAMEDULLARY (IM) NAIL INTERTROCHANTERIC Left 09/30/2020   Procedure: INTRAMEDULLARY (IM) NAIL INTERTROCHANTRIC;  Surgeon: Leim Fabry, MD;  Location: [facility] ORS;  Service: Orthopedics;  Laterality: Left;   MULTIPLE TOOTH EXTRACTIONS  06/2015   for dentures   UMBILICAL HERNIA REPAIR  02/25/2010    Allergies  Allergen Reactions   Celebrex [Celecoxib] Other (See Comments)    GI upset   Latex Itching    NEGATIVE LATEX IgE (< 0.10) 11/04/2016   Statins Other (See Comments)    Muscle pain   Adhesive [Tape] Rash    Allergies as of 03/09/2022       Reactions   Celebrex [celecoxib] Other (See Comments)   GI upset   Latex Itching   NEGATIVE LATEX IgE (< 0.10) 11/04/2016   Statins Other (See Comments)   Muscle pain   Adhesive [tape] Rash        Medication  List        Accurate as of March 09, 2022  2:01 PM. If you have any questions, ask your nurse or doctor.          STOP taking these medications    DOXYCYCLINE PO Stopped by: Durenda Age, NP       TAKE these medications    acetaminophen 325 MG tablet Commonly known as: TYLENOL Take 650 mg by mouth 3 (three) times daily.   alendronate 70 MG tablet Commonly known as: Fosamax Take 1 tablet (70 mg total) by mouth once a week. Take with a full glass of water on an empty stomach.   calcium carbonate 500 MG chewable tablet Commonly known as: TUMS - dosed in mg elemental  calcium Chew 2 tablets (400 mg of elemental calcium total) by mouth 2 (two) times daily.   cetaphil cream Apply topically as needed.   CHLORHEXIDINE GLUCONATE (BULK) Soln 15 mLs by Does not apply route in the morning and at bedtime.   cholecalciferol 25 MCG (1000 UNIT) tablet Commonly known as: VITAMIN D3 Take 1,000 Units by mouth daily.   docusate sodium 100 MG capsule Commonly known as: COLACE Take 1 capsule (100 mg total) by mouth 2 (two) times daily as needed for mild constipation.   donepezil 10 MG tablet Commonly known as: ARICEPT Take 10 mg by mouth at bedtime.   ferrous sulfate 325 (65 FE) MG tablet Take 1 tablet (325 mg total) by mouth daily with breakfast.   guaiFENesin 600 MG 12 hr tablet Commonly known as: MUCINEX Take 600 mg by mouth 2 (two) times daily.   hydroxypropyl methylcellulose / hypromellose 2.5 % ophthalmic solution Commonly known as: ISOPTO TEARS / GONIOVISC Place 1 drop into both eyes 2 (two) times daily as needed for dry eyes.   lactase 3000 units tablet Commonly known as: LACTAID Take 6,000 Units by mouth 3 (three) times daily with meals.   magnesium hydroxide 400 MG/5ML suspension Commonly known as: MILK OF MAGNESIA Take 30 mLs by mouth daily as needed for mild constipation.   memantine 10 MG tablet Commonly known as: NAMENDA Take 10 mg by mouth 2 (two) times daily.   Multivitamin Adult Tabs Take 1 tablet by mouth daily. SHAKLEE "LIFE STRIP" MULTIVITAMIN PACK   NP Thyroid 15 MG tablet Generic drug: thyroid Take 15 mg by mouth every morning.   NP Thyroid 60 MG tablet Generic drug: thyroid Take 60 mg by mouth in the morning.   nystatin powder Commonly known as: MYCOSTATIN/NYSTOP Apply 1 Application topically every 12 (twelve) hours as needed.   olopatadine 0.1 % ophthalmic solution Commonly known as: PATANOL Place 1 drop into the left eye daily.   PreserVision AREDS 2 Caps Take 1 tablet by mouth in the morning and at bedtime.    sertraline 100 MG tablet Commonly known as: ZOLOFT Take 150 mg by mouth at bedtime.   triamcinolone cream 0.1 % Commonly known as: KENALOG Apply 1 Application topically every 12 (twelve) hours as needed.   Tussin DM 10-100 MG/5ML liquid Generic drug: dextromethorphan-guaiFENesin Take 10 mLs by mouth every 4 (four) hours as needed for cough.        Review of Systems  Unable to obtain due to dementia.  Immunization History  Administered Date(s) Administered   Influenza-Unspecified 12/28/2011, 01/16/2013, 02/02/2014, 12/28/2014, 01/14/2015, 12/30/2015, 12/23/2016   Moderna Sars-Covid-2 Vaccination 03/26/2019, 04/23/2019   PPD Test 12/20/2016   Tdap 12/28/2013   Unspecified SARS-COV-2 Vaccination 08/11/2021   Pertinent  Health  Maintenance Due  Topic Date Due   DEXA SCAN  Never done   INFLUENZA VACCINE  10/13/2021      04/03/2021    8:38 PM 04/04/2021    8:00 AM 04/04/2021    8:46 PM 04/05/2021    9:00 PM 04/06/2021   11:00 AM  Fall Risk  Patient Fall Risk Level High fall risk High fall risk High fall risk High fall risk High fall risk   Functional Status Survey:    Vitals:   03/09/22 1332  BP: (!) 149/76  Pulse: 73  Resp: 20  Temp: (!) 97.3 F (36.3 C)  SpO2: 95%  Weight: 157 lb (71.2 kg)  Height: '5\' 3"'$  (1.6 m)   Body mass index is 27.81 kg/m. Physical Exam Constitutional:      General: She is not in acute distress.    Appearance: Normal appearance.  HENT:     Head: Normocephalic and atraumatic.     Nose: Nose normal.     Mouth/Throat:     Mouth: Mucous membranes are moist.  Eyes:     Conjunctiva/sclera: Conjunctivae normal.  Cardiovascular:     Rate and Rhythm: Normal rate and regular rhythm.  Pulmonary:     Effort: Pulmonary effort is normal.     Breath sounds: Rales present.  Abdominal:     General: Bowel sounds are normal.     Palpations: Abdomen is soft.  Musculoskeletal:        General: Normal range of motion.     Cervical back: Normal  range of motion.  Skin:    General: Skin is warm and dry.  Neurological:     Mental Status: Mental status is at baseline. She is disoriented.  Psychiatric:        Mood and Affect: Mood normal.        Behavior: Behavior normal.    Labs reviewed: Recent Labs    04/04/21 0532 04/05/21 0424 04/06/21 0538  NA 133* 135 133*  K 4.3 4.1 4.3  CL 99 101 100  CO2 '27 27 28  '$ GLUCOSE 89 94 101*  BUN '19 17 18  '$ CREATININE 1.18* 0.87 0.64  CALCIUM 8.1* 8.4* 8.5*  MG  --  1.8 2.0   Recent Labs    04/03/21 0651  AST 25  ALT 11  ALKPHOS 92  BILITOT 0.5  PROT 6.8  ALBUMIN 3.2*   Recent Labs    04/03/21 0650 04/03/21 1454 04/04/21 0532 04/04/21 1125 04/05/21 0424 04/06/21 0538  WBC 16.9*  --  8.1  --  8.3 8.8  NEUTROABS 12.1*  --   --   --   --   --   HGB 11.1*   < > 6.9* 6.8* 8.2* 8.4*  HCT 33.4*   < > 20.3* 20.6* 24.0* 25.3*  MCV 89.8  --  89.8  --  90.9 90.0  PLT 382  --  190  --  162 168   < > = values in this interval not displayed.   Lab Results  Component Value Date   TSH 10.059 (H) 03/01/2019   No results found for: "HGBA1C" No results found for: "CHOL", "HDL", "LDLCALC", "LDLDIRECT", "TRIG", "CHOLHDL"  Significant Diagnostic Results in last 30 days:  No results found.  Assessment/Plan  1. Cough in adult -  continue Robitussin 10 ml Q 4 hours PRN and discontinue Mucinex -  chest x-ray PA and lateral to rule out pneumonia  2. Iron deficiency anemia, unspecified iron deficiency anemia type Lab Results  Component Value Date   HGB 12.4 12/31/2021   -  discontinue FeSO4  3. Severe late onset Alzheimer's dementia with mood disturbance ([place]) - continue Memantine and Aricept -  continue supportive care   Family/ staff Communication:  Discussed plan of care with charge nurse.  Labs/tests ordered:   chest x-ray PA and lateral

## 2022-03-10 ENCOUNTER — Non-Acute Institutional Stay (SKILLED_NURSING_FACILITY): Payer: Medicare Other | Admitting: Student

## 2022-03-10 ENCOUNTER — Encounter: Payer: Self-pay | Admitting: Student

## 2022-03-10 DIAGNOSIS — F0390 Unspecified dementia without behavioral disturbance: Secondary | ICD-10-CM

## 2022-03-10 DIAGNOSIS — S72145S Nondisplaced intertrochanteric fracture of left femur, sequela: Secondary | ICD-10-CM

## 2022-03-10 DIAGNOSIS — I10 Essential (primary) hypertension: Secondary | ICD-10-CM

## 2022-03-10 DIAGNOSIS — E039 Hypothyroidism, unspecified: Secondary | ICD-10-CM | POA: Diagnosis not present

## 2022-03-10 NOTE — Progress Notes (Addendum)
Location:  Other [place].  Nursing Home Room Number: [place] of Service:  SNF [phone]) Provider:  Dr. Amada Kingfisher, MD  Patient Care Team: Dewayne Shorter, MD as PCP - General [facility] Medicine)  Extended Emergency Contact Information Primary Emergency Contact: [place] Mobile Phone: [phone] Relation: Daughter Secondary Emergency Contact: Silvers,fred Mobile Phone: [phone] Relation: Son  Code Status:  DNR Goals of care: Advanced Directive information    03/10/2022   10:02 AM  Advanced Directives  Does Patient Have a Medical Advance Directive? Yes  Type of Paramedic of [place];Out of facility DNR (pink MOST or yellow form)  Does patient want to make changes to medical advance directive? No - Patient declined     Chief Complaint  Patient presents with   Medical Management of Chronic Issues    Medical Management of Chronic Issues.     HPI:  Pt is a 88 y.o. female seen today for medical management of chronic diseases.  Per nursing, there ar no acute concerns at this time.   She is wheelchair dependent  and maximal assist. She eats a regular diet and typically eats 50-100% of her meals.No skin wounds per weekly assessments.    Patient states she has been fine. Isn't able to clearly answer orientation questions, however, she does ask to help some of the other residents with getting to their room, getting their shoes on. She says she is glad to have a woman doctor.    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   Depression    Fracture, metacarpal 06/2016   5th metacarpal   History of chicken pox    Hypertension    Hypothyroidism    Macular degeneration    left eye   Thyroid disease    Past Surgical History:  Procedure Laterality Date   CARPAL TUNNEL RELEASE  12/09/2010   CHOLECYSTECTOMY  1995   COLON  SURGERY  02/25/2010   transverse colon resection   COLONOSCOPY     ESOPHAGOGASTRODUODENOSCOPY  02/25/2010   GANGLION CYST EXCISION Right 07/26/2016   Procedure: REMOVAL GANGLION OF WRIST;  Surgeon: Dereck Leep, MD;  Location: [facility] ORS;  Service: Orthopedics;  Laterality: Right;   GANGLION CYST EXCISION Right 11/10/2016   Procedure: REMOVAL GANGLION OF WRIST;  Surgeon: Dereck Leep, MD;  Location: [facility] ORS;  Service: Orthopedics;  Laterality: Right;   HERNIA REPAIR     INTRAMEDULLARY (IM) NAIL INTERTROCHANTERIC Left 09/30/2020   Procedure: INTRAMEDULLARY (IM) NAIL INTERTROCHANTRIC;  Surgeon: Leim Fabry, MD;  Location: [facility] ORS;  Service: Orthopedics;  Laterality: Left;   MULTIPLE TOOTH EXTRACTIONS  06/2015   for dentures   UMBILICAL HERNIA REPAIR  02/25/2010    Allergies  Allergen Reactions   Celebrex [Celecoxib] Other (See Comments)    GI upset   Coconut (Cocos Nucifera)    Latex Itching    NEGATIVE LATEX IgE (< 0.10) 11/04/2016   Statins Other (See Comments)    Muscle pain   Strawberry (Diagnostic)    Adhesive [Tape] Rash    Outpatient Encounter Medications as of 03/10/2022  Medication Sig   acetaminophen (TYLENOL) 325 MG tablet Take 650 mg by mouth 3 (three) times daily.   alendronate (FOSAMAX) 70 MG tablet Take 1 tablet (70 mg total) by mouth once a week. Take with a full glass of water on an empty stomach.   calcium  carbonate (TUMS - DOSED IN MG ELEMENTAL CALCIUM) 500 MG chewable tablet Chew 2 tablets (400 mg of elemental calcium total) by mouth 2 (two) times daily.   CHLORHEXIDINE GLUCONATE, BULK, SOLN 15 mLs by Does not apply route in the morning and at bedtime.   cholecalciferol (VITAMIN D) 1000 units tablet Take 1,000 Units by mouth daily.   dextromethorphan-guaiFENesin (TUSSIN DM) 10-100 MG/5ML liquid Take 10 mLs by mouth every 4 (four) hours as needed for cough.   docusate sodium (COLACE) 100 MG capsule Take 100 mg by mouth daily.   donepezil (ARICEPT) 10 MG  tablet Take 10 mg by mouth at bedtime.    Emollient (CETAPHIL) cream Apply topically as needed.   hydroxypropyl methylcellulose / hypromellose (ISOPTO TEARS / GONIOVISC) 2.5 % ophthalmic solution Place 1 drop into both eyes 2 (two) times daily as needed for dry eyes.   lactase (LACTAID) 3000 units tablet Take 6,000 Units by mouth 3 (three) times daily with meals.   magnesium hydroxide (MILK OF MAGNESIA) 400 MG/5ML suspension Take 30 mLs by mouth daily as needed for mild constipation.   memantine (NAMENDA) 10 MG tablet Take 10 mg by mouth 2 (two) times daily.   Multiple Vitamin (MULTIVITAMIN ADULT) TABS Take 1 tablet by mouth daily. SHAKLEE "LIFE STRIP" MULTIVITAMIN PACK   Multiple Vitamins-Minerals (PRESERVISION AREDS 2) CAPS Take 1 tablet by mouth in the morning and at bedtime.   NP THYROID 15 MG tablet Take 15 mg by mouth every morning.   NP THYROID 60 MG tablet Take 60 mg by mouth in the morning.   nystatin (MYCOSTATIN/NYSTOP) powder Apply 1 Application topically every 12 (twelve) hours as needed.   olopatadine (PATANOL) 0.1 % ophthalmic solution Place 1 drop into the left eye daily.   sertraline (ZOLOFT) 100 MG tablet Take 150 mg by mouth at bedtime.   triamcinolone cream (KENALOG) 0.1 % Apply 1 Application topically every 12 (twelve) hours as needed.   [DISCONTINUED] docusate sodium (COLACE) 100 MG capsule Take 1 capsule (100 mg total) by mouth 2 (two) times daily as needed for mild constipation. (Patient taking differently: Take 100 mg by mouth daily.)   [DISCONTINUED] ferrous sulfate 325 (65 FE) MG tablet Take 1 tablet (325 mg total) by mouth daily with breakfast.   [DISCONTINUED] guaiFENesin (MUCINEX) 600 MG 12 hr tablet Take 600 mg by mouth 2 (two) times daily.   No facility-administered encounter medications on file as of 03/10/2022.    Review of Systems  Immunization History  Administered Date(s) Administered   Influenza-Unspecified 12/28/2011, 01/16/2013, 02/02/2014, 12/28/2014,  01/14/2015, 12/30/2015, 12/23/2016, 12/29/2021   Moderna Sars-Covid-2 Vaccination 03/26/2019, 04/23/2019   PPD Test 12/20/2016   Pneumococcal-Unspecified 09/01/2018   Tdap 12/28/2013   Unspecified SARS-COV-2 Vaccination 08/11/2021   Pertinent  Health Maintenance Due  Topic Date Due   DEXA SCAN  Never done   INFLUENZA VACCINE  Completed      04/03/2021    8:38 PM 04/04/2021    8:00 AM 04/04/2021    8:46 PM 04/05/2021    9:00 PM 04/06/2021   11:00 AM  Fall Risk  (RETIRED) Patient Fall Risk Level High fall risk High fall risk High fall risk High fall risk High fall risk   Functional Status Survey:    Vitals:   03/10/22 0948  BP: (!) 149/76  Pulse: 73  Resp: 20  Temp: (!) 97.3 F (36.3 C)  SpO2: 95%  Weight: 157 lb (71.2 kg)  Height: '5\' 3"'$  (1.6 m)   Body mass  index is 27.81 kg/m. Physical Exam Cardiovascular:     Rate and Rhythm: Normal rate.     Pulses: Normal pulses.  Pulmonary:     Effort: Pulmonary effort is normal.  Abdominal:     General: Bowel sounds are normal.     Palpations: Abdomen is soft.  Skin:    General: Skin is warm and dry.  Neurological:     Mental Status: She is alert. Mental status is at baseline.     Labs reviewed: Recent Labs    12/31/21 0000  NA 131*  K 4.4  CL 99  CO2 27*  BUN 8  CREATININE 0.8  CALCIUM 8.3*   Recent Labs    12/31/21 0000  AST 19  ALT 8  ALKPHOS 82  ALBUMIN 3.5   Recent Labs    12/31/21 0000  WBC 7.0  HGB 12.4  HCT 37  PLT 247   Lab Results  Component Value Date   TSH 10.059 (H) 03/01/2019   No results found for: "HGBA1C" No results found for: "CHOL", "HDL", "LDLCALC", "LDLDIRECT", "TRIG", "CHOLHDL"  Significant Diagnostic Results in last 30 days:  No results found.  Assessment/Plan 1. Dementia, old age, without behavioral disturbance ([place]) Patient has gained 10 lbs in the last year. No behaviors at this time. Continue donepezil 10 mg nightly and memantine 10 mg BID. Patient is DNR. Continue  sertraline 100 mg daily.   2. Essential hypertension BP slightly above goal today. Usually within goal range. Will continue to monitor given patient's history of falls.   3. Hypothyroidism, unspecified type Patient takes 75 mg of NP thyroid daily. Most recent T3 and T4 normal range. Will continue with annual check.   4. Closed nondisplaced intertrochanteric fracture of left femur, sequela 09/30/2020 after a fall. No recent falls. Continue PT Prn. Continue fosamax 70 mg weekly. Consider holiday or discontinuing based on patient's stability.   Family/ staff Communication: nursing  Labs/tests ordered:  none.

## 2022-04-09 ENCOUNTER — Telehealth: Payer: Medicare Other | Admitting: Student

## 2022-04-09 NOTE — Telephone Encounter (Signed)
Received phone call from nurse [place] patient is a resident. Patient has had irritation of the lip due to implanted dentures. She has a dentist appointment on Monday, however, she has continued to have discomfort. Will plan for saline rinses BID and topical treatment for pain control with a combination of viscous lidocaine 2% 15 mL and 30 mL of milk of magnesia mixed together and applied every 6 hours for pain management. Patient has an appointment with dentistry on Monday.

## 2022-04-27 ENCOUNTER — Non-Acute Institutional Stay (SKILLED_NURSING_FACILITY): Payer: Medicare Other | Admitting: Nurse Practitioner

## 2022-04-27 ENCOUNTER — Encounter: Payer: Self-pay | Admitting: Nurse Practitioner

## 2022-04-27 DIAGNOSIS — I10 Essential (primary) hypertension: Secondary | ICD-10-CM

## 2022-04-27 DIAGNOSIS — Z66 Do not resuscitate: Secondary | ICD-10-CM

## 2022-04-27 DIAGNOSIS — F0390 Unspecified dementia without behavioral disturbance: Secondary | ICD-10-CM

## 2022-04-27 DIAGNOSIS — D509 Iron deficiency anemia, unspecified: Secondary | ICD-10-CM

## 2022-04-27 DIAGNOSIS — E039 Hypothyroidism, unspecified: Secondary | ICD-10-CM | POA: Diagnosis not present

## 2022-04-27 DIAGNOSIS — F330 Major depressive disorder, recurrent, mild: Secondary | ICD-10-CM

## 2022-04-27 DIAGNOSIS — M810 Age-related osteoporosis without current pathological fracture: Secondary | ICD-10-CM

## 2022-04-27 NOTE — Progress Notes (Unsigned)
Location:  Other [facility]) Nursing Home Room Number: 216 A Place of Service:  SNF (31) Provider:  Carlos American. Dewaine Oats, NP   Patient Care Team: Dewayne Shorter, MD as PCP - General [facility] Medicine)  Extended Emergency Contact Information Primary Emergency Contact: Halina [place] of [place] Mobile Phone: [phone] Relation: Daughter Secondary Emergency Contact: Gearing,fred Mobile Phone: [phone] Relation: Son  Code Status:  DNR Goals of care: Advanced Directive information    04/27/2022    2:12 PM  Advanced Directives  Does Patient Have a Medical Advance Directive? Yes  Type of Paramedic of [place];Living will;Out of facility DNR (pink MOST or yellow form)  Does patient want to make changes to medical advance directive? No - Patient declined  Copy of [place] in Chart? Yes - validated most recent copy scanned in chart (See row information)  Pre-existing out of facility DNR order (yellow form or pink MOST form) Yellow form placed in chart (order not valid for inpatient use)     Chief Complaint  Patient presents with   Medical Management of Chronic Issues    Routine visit. Discuss need for AWV, shingrix, pneumonia vaccine, DEXA, and additional covid boosters or post pone if patient refuses or is not a candidate.     HPI:  Pt is a 89 y.o. female seen today for medical management of chronic diseases.   Pt with dementia, htn, depression, hypothryoid, MD. She is wheelchair bound. Staff has been monitoring gum and lips due to irritation from dentures which has improved and she has been seeing dentist to resolve issues.  Staff has no acute concerns.  Pt denies pain or discomfort. ROS limited due to dementia.   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[facility])    adenocarcinoma of transverse colon   Depression    Fracture, metacarpal 06/2016   5th metacarpal   History of chicken pox     Hypertension    Hypothyroidism    Macular degeneration    left eye   Thyroid disease    Past Surgical History:  Procedure Laterality Date   CARPAL TUNNEL RELEASE  12/09/2010   CHOLECYSTECTOMY  1995   COLON SURGERY  02/25/2010   transverse colon resection   COLONOSCOPY     ESOPHAGOGASTRODUODENOSCOPY  02/25/2010   GANGLION CYST EXCISION Right 07/26/2016   Procedure: REMOVAL GANGLION OF WRIST;  Surgeon: Dereck Leep, MD;  Location: [facility] ORS;  Service: Orthopedics;  Laterality: Right;   GANGLION CYST EXCISION Right 11/10/2016   Procedure: REMOVAL GANGLION OF WRIST;  Surgeon: Dereck Leep, MD;  Location: [facility] ORS;  Service: Orthopedics;  Laterality: Right;   HERNIA REPAIR     INTRAMEDULLARY (IM) NAIL INTERTROCHANTERIC Left 09/30/2020   Procedure: INTRAMEDULLARY (IM) NAIL INTERTROCHANTRIC;  Surgeon: Leim Fabry, MD;  Location: [facility] ORS;  Service: Orthopedics;  Laterality: Left;   MULTIPLE TOOTH EXTRACTIONS  06/2015   for dentures   UMBILICAL HERNIA REPAIR  02/25/2010    Allergies  Allergen Reactions   Celebrex [Celecoxib] Other (See Comments)    GI upset   Coconut (Cocos Nucifera)    Latex Itching    NEGATIVE LATEX IgE (< 0.10) 11/04/2016   Statins Other (See Comments)    Muscle pain   Strawberry (Diagnostic)    Adhesive [Tape] Rash    Outpatient Encounter Medications as of 04/27/2022  Medication Sig   acetaminophen (TYLENOL) 325 MG tablet Take 650 mg by mouth 3 (three) times daily.  alendronate (FOSAMAX) 70 MG tablet Take 1 tablet (70 mg total) by mouth once a week. Take with a full glass of water on an empty stomach.   calcium carbonate (TUMS - DOSED IN MG ELEMENTAL CALCIUM) 500 MG chewable tablet Chew 2 tablets (400 mg of elemental calcium total) by mouth 2 (two) times daily.   CHLORHEXIDINE GLUCONATE, BULK, SOLN 15 mLs by Does not apply route in the morning and at bedtime.   cholecalciferol (VITAMIN D) 1000 units tablet Take 1,000 Units by mouth daily.    dextromethorphan-guaiFENesin (TUSSIN DM) 10-100 MG/5ML liquid Take 10 mLs by mouth every 4 (four) hours as needed for cough.   docusate sodium (COLACE) 100 MG capsule Take 100 mg by mouth daily.   donepezil (ARICEPT) 10 MG tablet Take 10 mg by mouth at bedtime.    Emollient (CETAPHIL) cream Apply topically as needed.   hydroxypropyl methylcellulose / hypromellose (ISOPTO TEARS / GONIOVISC) 2.5 % ophthalmic solution Place 1 drop into both eyes 2 (two) times daily as needed for dry eyes.   lactase (LACTAID) 3000 units tablet Take 6,000 Units by mouth 3 (three) times daily with meals.   lidocaine (XYLOCAINE) 2 % solution Use as directed 15 mLs in the mouth or throat every 6 (six) hours as needed for mouth pain. MIX WITH 32m OF MILK OF MAGNESIA AND APPLY TO SORE IN MOUTH   magic mouthwash SOLN Take 5 mLs by mouth every 6 (six) hours as needed for mouth pain.   magnesium hydroxide (MILK OF MAGNESIA) 400 MG/5ML suspension Take 30 mLs by mouth daily as needed for mild constipation.   memantine (NAMENDA) 10 MG tablet Take 10 mg by mouth 2 (two) times daily.   Multiple Vitamin (MULTIVITAMIN ADULT) TABS Take 1 tablet by mouth daily. SHAKLEE "LIFE STRIP" MULTIVITAMIN PACK   Multiple Vitamins-Minerals (PRESERVISION AREDS 2) CAPS Take 1 tablet by mouth in the morning and at bedtime.   NP THYROID 15 MG tablet Take 15 mg by mouth every morning.   NP THYROID 60 MG tablet Take 60 mg by mouth in the morning.   nystatin (MYCOSTATIN/NYSTOP) powder Apply 1 Application topically every 12 (twelve) hours as needed.   olopatadine (PATANOL) 0.1 % ophthalmic solution Place 1 drop into the left eye daily.   sertraline (ZOLOFT) 100 MG tablet Take 150 mg by mouth at bedtime.   triamcinolone cream (KENALOG) 0.1 % Apply 1 Application topically every 12 (twelve) hours as needed.   No facility-administered encounter medications on file as of 04/27/2022.    Review of Systems  Constitutional:  Negative for activity change,  appetite change, fatigue and unexpected weight change.  HENT:  Negative for congestion and hearing loss.   Eyes: Negative.   Respiratory:  Negative for cough and shortness of breath.   Cardiovascular:  Negative for chest pain, palpitations and leg swelling.  Gastrointestinal:  Negative for abdominal pain, constipation and diarrhea.  Genitourinary:  Negative for difficulty urinating and dysuria.  Musculoskeletal:  Negative for arthralgias and myalgias.  Skin:  Negative for color change and wound.  Neurological:  Positive for weakness. Negative for dizziness.  Psychiatric/Behavioral:  Positive for confusion. Negative for agitation and behavioral problems.     Immunization History  Administered Date(s) Administered   Influenza-Unspecified 12/28/2011, 01/16/2013, 02/02/2014, 12/28/2014, 01/14/2015, 12/30/2015, 12/23/2016, 12/29/2021   Moderna Sars-Covid-2 Vaccination 03/26/2019, 04/23/2019   PPD Test 12/20/2016   Pneumococcal-Unspecified 09/01/2018   Tdap 12/28/2013   Unspecified SARS-COV-2 Vaccination 08/11/2021   Pertinent  Health Maintenance Due  Topic Date Due   DEXA SCAN  Never done   INFLUENZA VACCINE  Completed      04/03/2021    8:38 PM 04/04/2021    8:00 AM 04/04/2021    8:46 PM 04/05/2021    9:00 PM 04/06/2021   11:00 AM  Fall Risk  (RETIRED) Patient Fall Risk Level High fall risk High fall risk High fall risk High fall risk High fall risk   Functional Status Survey:    Vitals:   04/27/22 1410 04/27/22 1418  BP: (!) 176/84 (!) 147/69  Pulse: 76   Weight: 159 lb 12.8 oz (72.5 kg)   Height: 5' 3"$  (1.6 m)    Body mass index is 28.31 kg/m. Physical Exam Constitutional:      General: She is not in acute distress.    Appearance: She is well-developed. She is not diaphoretic.  HENT:     Head: Normocephalic and atraumatic.     Mouth/Throat:     Pharynx: No oropharyngeal exudate.  Eyes:     Conjunctiva/sclera: Conjunctivae normal.     Pupils: Pupils are equal,  round, and reactive to light.  Cardiovascular:     Rate and Rhythm: Normal rate and regular rhythm.     Heart sounds: Normal heart sounds.  Pulmonary:     Effort: Pulmonary effort is normal.     Breath sounds: Normal breath sounds.  Abdominal:     General: Bowel sounds are normal.     Palpations: Abdomen is soft.  Musculoskeletal:     Cervical back: Normal range of motion and neck supple.     Right lower leg: No edema.     Left lower leg: No edema.  Skin:    General: Skin is warm and dry.  Neurological:     Mental Status: She is alert.  Psychiatric:        Mood and Affect: Mood normal.     Labs reviewed: Recent Labs    12/31/21 0000  NA 131*  K 4.4  CL 99  CO2 27*  BUN 8  CREATININE 0.8  CALCIUM 8.3*   Recent Labs    12/31/21 0000  AST 19  ALT 8  ALKPHOS 82  ALBUMIN 3.5   Recent Labs    12/31/21 0000  WBC 7.0  HGB 12.4  HCT 37  PLT 247   Lab Results  Component Value Date   TSH 10.059 (H) 03/01/2019     No results found for: "HGBA1C" No results found for: "CHOL", "HDL", "LDLCALC", "LDLDIRECT", "TRIG", "CHOLHDL"  Significant Diagnostic Results in last 30 days:  No results found.  Assessment/Plan 1. Essential hypertension -blood pressure elevated but improved on recheck. Will continue to follow and adjust medications as needed  2. Dementia, old age, without behavioral disturbance ([place]) -Stable, no acute changes in cognitive or functional status, continue supportive care with staff.   3. Do not resuscitate Noted in records - Do not attempt resuscitation (DNR)  4. Hypothyroidism, unspecified type -free T3 and free T4 normal in May 2023, will continue current NP thyroid dose   5. Iron deficiency anemia, unspecified iron deficiency anemia type -stable on last labs.  6. Mild episode of recurrent major depressive disorder (HCC) -stable on zoloft 100 mg daily  7. Osteoporosis, unspecified osteoporosis type, unspecified pathological fracture  presence Continues on fosamax weekly with cal and vit d  Carlos American. [place], [place] [facility] [phone]

## 2022-07-09 ENCOUNTER — Encounter: Payer: Self-pay | Admitting: Student

## 2022-07-09 ENCOUNTER — Non-Acute Institutional Stay (SKILLED_NURSING_FACILITY): Payer: Medicare Other | Admitting: Student

## 2022-07-09 DIAGNOSIS — F330 Major depressive disorder, recurrent, mild: Secondary | ICD-10-CM | POA: Diagnosis not present

## 2022-07-09 DIAGNOSIS — Z66 Do not resuscitate: Secondary | ICD-10-CM

## 2022-07-09 DIAGNOSIS — E039 Hypothyroidism, unspecified: Secondary | ICD-10-CM | POA: Diagnosis not present

## 2022-07-09 DIAGNOSIS — I10 Essential (primary) hypertension: Secondary | ICD-10-CM | POA: Diagnosis not present

## 2022-07-09 DIAGNOSIS — F02C3 Dementia in other diseases classified elsewhere, severe, with mood disturbance: Secondary | ICD-10-CM

## 2022-07-09 DIAGNOSIS — D509 Iron deficiency anemia, unspecified: Secondary | ICD-10-CM | POA: Diagnosis not present

## 2022-07-09 DIAGNOSIS — G301 Alzheimer's disease with late onset: Secondary | ICD-10-CM

## 2022-07-09 DIAGNOSIS — M810 Age-related osteoporosis without current pathological fracture: Secondary | ICD-10-CM

## 2022-07-09 NOTE — Progress Notes (Unsigned)
Location:  Other [facility]) Nursing Home Room Number: 216 A Place of Service:  SNF [phone]) Provider:  Earnestine Mealing, MD  Patient Care Team: Earnestine Mealing, MD as PCP - General (Family Medicine)  Extended Emergency Contact Information Primary Emergency Contact: Nile Riggs States of [place] Mobile Phone: [phone] Relation: Daughter Secondary Emergency Contact: Brougham,fred Mobile Phone: [phone] Relation: Son  Code Status:  DNR Goals of care: Advanced Directive information    07/09/2022    9:42 AM  Advanced Directives  Does Patient Have a Medical Advance Directive? Yes  Type of Estate agent of [place];Living will;Out of facility DNR (pink MOST or yellow form)  Does patient want to make changes to medical advance directive? No - Patient declined  Copy of Healthcare Power of Attorney in Chart? Yes - validated most recent copy scanned in chart (See row information)  Pre-existing out of facility DNR order (yellow form or pink MOST form) Yellow form placed in chart (order not valid for inpatient use)     Chief Complaint  Patient presents with   Medical Management of Chronic Issues    Routine visit. Discuss need for AWV, shingrix, pneumonia vaccine, DEXA, and additional covid boosters or post pone if patient refuses or is not a candidate.     HPI:  Pt is a 89 y.o. female seen today for medical management of chronic diseases.    Patient states she has no concerns and she hopes that we think she is healthy. She gives her birthday, but not the year. She says she's 89 years old and we are in the state of [place].   Nursing without concerns at this time. She is eating well. Mouth has healed well since previous visit.   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[facility])    adenocarcinoma of transverse colon   Depression    Fracture, metacarpal 06/2016   5th metacarpal   History of chicken pox    Hypertension     Hypothyroidism    Macular degeneration    left eye   Thyroid disease    Past Surgical History:  Procedure Laterality Date   CARPAL TUNNEL RELEASE  12/09/2010   CHOLECYSTECTOMY  1995   COLON SURGERY  02/25/2010   transverse colon resection   COLONOSCOPY     ESOPHAGOGASTRODUODENOSCOPY  02/25/2010   GANGLION CYST EXCISION Right 07/26/2016   Procedure: REMOVAL GANGLION OF WRIST;  Surgeon: Donato Heinz, MD;  Location: [facility] ORS;  Service: Orthopedics;  Laterality: Right;   GANGLION CYST EXCISION Right 11/10/2016   Procedure: REMOVAL GANGLION OF WRIST;  Surgeon: Donato Heinz, MD;  Location: [facility] ORS;  Service: Orthopedics;  Laterality: Right;   HERNIA REPAIR     INTRAMEDULLARY (IM) NAIL INTERTROCHANTERIC Left 09/30/2020   Procedure: INTRAMEDULLARY (IM) NAIL INTERTROCHANTRIC;  Surgeon: Signa Kell, MD;  Location: [facility] ORS;  Service: Orthopedics;  Laterality: Left;   MULTIPLE TOOTH EXTRACTIONS  06/2015   for dentures   UMBILICAL HERNIA REPAIR  02/25/2010    Allergies  Allergen Reactions   Celebrex [Celecoxib] Other (See Comments)    GI upset   Coconut (Cocos Nucifera)    Latex Itching    NEGATIVE LATEX IgE (< 0.10) 11/04/2016   Statins Other (See Comments)    Muscle pain   Strawberry (Diagnostic)    Adhesive [Tape] Rash    Outpatient Encounter Medications as of 07/09/2022  Medication Sig   acetaminophen (TYLENOL) 325 MG tablet Take 650 mg by mouth 3 (  three) times daily.   alendronate (FOSAMAX) 70 MG tablet Take 1 tablet (70 mg total) by mouth once a week. Take with a full glass of water on an empty stomach.   calcium carbonate (TUMS - DOSED IN MG ELEMENTAL CALCIUM) 500 MG chewable tablet Chew 2 tablets (400 mg of elemental calcium total) by mouth 2 (two) times daily.   CHLORHEXIDINE GLUCONATE, BULK, SOLN 15 mLs by Does not apply route in the morning and at bedtime.   cholecalciferol (VITAMIN D) 1000 units tablet Take 1,000 Units by mouth daily.   dextromethorphan-guaiFENesin  (TUSSIN DM) 10-100 MG/5ML liquid Take 10 mLs by mouth every 4 (four) hours as needed for cough.   docusate sodium (COLACE) 100 MG capsule Take 100 mg by mouth daily.   donepezil (ARICEPT) 10 MG tablet Take 10 mg by mouth at bedtime.    Emollient (CETAPHIL) cream Apply topically as needed.   hydroxypropyl methylcellulose / hypromellose (ISOPTO TEARS / GONIOVISC) 2.5 % ophthalmic solution Place 1 drop into both eyes 2 (two) times daily as needed for dry eyes.   lactase (LACTAID) 3000 units tablet Take 6,000 Units by mouth 3 (three) times daily with meals.   magnesium hydroxide (MILK OF MAGNESIA) 400 MG/5ML suspension Take 30 mLs by mouth daily as needed for mild constipation.   memantine (NAMENDA) 10 MG tablet Take 10 mg by mouth 2 (two) times daily.   Multiple Vitamin (MULTIVITAMIN ADULT) TABS Take 1 tablet by mouth daily. SHAKLEE "LIFE STRIP" MULTIVITAMIN PACK   Multiple Vitamins-Minerals (PRESERVISION AREDS 2) CAPS Take 1 tablet by mouth in the morning and at bedtime.   NP THYROID 15 MG tablet Take 15 mg by mouth every morning.   NP THYROID 60 MG tablet Take 60 mg by mouth in the morning.   nystatin (MYCOSTATIN/NYSTOP) powder Apply 1 Application topically every 12 (twelve) hours as needed.   olopatadine (PATANOL) 0.1 % ophthalmic solution Place 1 drop into the left eye daily.   sertraline (ZOLOFT) 100 MG tablet Take 150 mg by mouth at bedtime.   triamcinolone cream (KENALOG) 0.1 % Apply 1 Application topically every 12 (twelve) hours as needed.   [DISCONTINUED] lidocaine (XYLOCAINE) 2 % solution Use as directed 15 mLs in the mouth or throat every 6 (six) hours as needed for mouth pain. MIX WITH 30ml OF MILK OF MAGNESIA AND APPLY TO SORE IN MOUTH   [DISCONTINUED] magic mouthwash SOLN Take 5 mLs by mouth every 6 (six) hours as needed for mouth pain.   No facility-administered encounter medications on file as of 07/09/2022.    Review of Systems  Immunization History  Administered Date(s)  Administered   Influenza-Unspecified 12/28/2011, 01/16/2013, 02/02/2014, 12/28/2014, 01/14/2015, 12/30/2015, 12/23/2016, 12/29/2021   Moderna Sars-Covid-2 Vaccination 03/26/2019, 04/23/2019   PPD Test 12/20/2016   Pneumococcal-Unspecified 09/01/2018   Tdap 12/28/2013   Unspecified SARS-COV-2 Vaccination 08/11/2021   Pertinent  Health Maintenance Due  Topic Date Due   DEXA SCAN  Never done   INFLUENZA VACCINE  10/14/2022      04/03/2021    8:38 PM 04/04/2021    8:00 AM 04/04/2021    8:46 PM 04/05/2021    9:00 PM 04/06/2021   11:00 AM  Fall Risk  (RETIRED) Patient Fall Risk Level High fall risk High fall risk High fall risk High fall risk High fall risk   Functional Status Survey:    Vitals:   07/09/22 0940  BP: 126/65  Pulse: 78  Weight: 158 lb 12.8 oz (72 kg)  Height:  5\' 3"  (1.6 m)   Body mass index is 28.13 kg/m. Physical Exam  Labs reviewed: Recent Labs    12/31/21 0000  NA 131*  K 4.4  CL 99  CO2 27*  BUN 8  CREATININE 0.8  CALCIUM 8.3*   Recent Labs    12/31/21 0000  AST 19  ALT 8  ALKPHOS 82  ALBUMIN 3.5   Recent Labs    12/31/21 0000  WBC 7.0  HGB 12.4  HCT 37  PLT 247   Lab Results  Component Value Date   TSH 10.059 (H) 03/01/2019   No results found for: "HGBA1C" No results found for: "CHOL", "HDL", "LDLCALC", "LDLDIRECT", "TRIG", "CHOLHDL"  Significant Diagnostic Results in last 30 days:  No results found.  Assessment/Plan 1. Essential hypertension BP at goal at this time. Continue current regimen  2. Hypothyroidism, unspecified type Labs ordered for may of this year, continue current dosing   3. Iron deficiency anemia, unspecified iron deficiency anemia type Stable hemoglobin, ctm.   4. Mild episode of recurrent major depressive disorder (HCC) Mood stable at this time, continue sertraline 100 mg daily.   5. Do not resuscitate DNR in place  6. Osteoporosis, unspecified osteoporosis type, unspecified pathological fracture  presence Continues fosamax and vitamin d. No current fxs or recent falls.   7. Severe late onset Alzheimer's dementia with mood disturbance (HCC) Patient continues to have progression of memory changes. Weight is stable.  Continue donepezil 10 mg daily. Continue memantin 10 mg daily. Monitor for weightloss or mood changes.    Family/ staff Communication: nursing  Labs/tests ordered:  labs q57mo, PCV 20.

## 2022-07-10 ENCOUNTER — Encounter: Payer: Self-pay | Admitting: Student

## 2022-07-30 ENCOUNTER — Encounter: Payer: Self-pay | Admitting: Student

## 2022-07-30 ENCOUNTER — Non-Acute Institutional Stay (SKILLED_NURSING_FACILITY): Payer: Medicare Other | Admitting: Student

## 2022-07-30 DIAGNOSIS — F02C3 Dementia in other diseases classified elsewhere, severe, with mood disturbance: Secondary | ICD-10-CM

## 2022-07-30 DIAGNOSIS — R059 Cough, unspecified: Secondary | ICD-10-CM

## 2022-07-30 DIAGNOSIS — G301 Alzheimer's disease with late onset: Secondary | ICD-10-CM | POA: Diagnosis not present

## 2022-07-30 LAB — CBC AND DIFFERENTIAL
HCT: 35 — AB (ref 36–46)
Hemoglobin: 11.7 — AB (ref 12.0–16.0)
Neutrophils Absolute: 5498
Platelets: 274 10*3/uL (ref 150–400)
WBC: 7.7

## 2022-07-30 LAB — CBC: RBC: 4.14 (ref 3.87–5.11)

## 2022-07-30 NOTE — Progress Notes (Unsigned)
Location:  Other [facility].  Nursing Home Room Number: [facility] 216A Place of Service:  SNF [phone]) Provider:  Earnestine Mealing, MD  Patient Care Team: Earnestine Mealing, MD as PCP - General [facility])  Extended Emergency Contact Information Primary Emergency Contact: Nile Riggs States of [place] Mobile Phone: [phone] Relation: Daughter Secondary Emergency Contact: Kirschenmann,fred Mobile Phone: [phone] Relation: Son  Code Status:  DNR Goals of care: Advanced Directive information    07/30/2022    3:11 PM  Advanced Directives  Does Patient Have a Medical Advance Directive? Yes  Type of Estate agent of [place];Out of facility DNR (pink MOST or yellow form);Living will  Does patient want to make changes to medical advance directive? No - Patient declined  Copy of Healthcare Power of Attorney in Chart? Yes - validated most recent copy scanned in chart (See row information)     Chief Complaint  Patient presents with   Acute Visit    SOB    HPI:  Pt is a 89 y.o. female seen today for an acute visit for SOB.   Patient is unaware of issues she has had with breathing or need for oxygen due to stage of dementia.  Received call from nursing that patient had a "coughing spell" which led to a low oxygen level of 74% returned to normal with 2LNC. She is now on room air at 98%.   Called patients daughter and HCPOA due to concern for aspiration and additional events in the future. Daughter noticed increased coughing with most recent visit. Discussed concern that if she has apiration this will lead to pneumonia. Discussed concern that aspriation could be due to weakning of muscles and she will have an evaluation by speech therapy for recommendations. If they recommend thickened liquids this often leads to dehydration so often families decide to continue comfort driven continuation of current diet. Also discussed most recent Ortho visit where  they recommended she have a lift belt. Discussed concern that we cannot risk the health and fitness of our nursing to lift patient without assistance -- she has been hoyer lift dependent. She states she has a lift she found that may not hurt her shoulder. Plan to send this to RN and share with PT/OT to determine feasibility and safety of lift. Discussed this may be out of pocket-- she is okay with the cost. Also discussed patient's dentures. She has posts in the bone and the bone is receeding. Dentist recommends 24 our wearing of dentures. Discussed concern that patient may not be compliant nor remember where she places her dentures as they have been lost before. She would like to comply with recommendations of dentist despite risk.    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[facility])    adenocarcinoma of transverse colon   Depression    Fracture, metacarpal 06/2016   5th metacarpal   History of chicken pox    Hypertension    Hypothyroidism    Macular degeneration    left eye   Thyroid disease    Past Surgical History:  Procedure Laterality Date   CARPAL TUNNEL RELEASE  12/09/2010   CHOLECYSTECTOMY  1995   COLON SURGERY  02/25/2010   transverse colon resection   COLONOSCOPY     ESOPHAGOGASTRODUODENOSCOPY  02/25/2010   GANGLION CYST EXCISION Right 07/26/2016   Procedure: REMOVAL GANGLION OF WRIST;  Surgeon: Donato Heinz, MD;  Location: [facility] ORS;  Service: Orthopedics;  Laterality: Right;   GANGLION  CYST EXCISION Right 11/10/2016   Procedure: REMOVAL GANGLION OF WRIST;  Surgeon: Donato Heinz, MD;  Location: [facility] ORS;  Service: Orthopedics;  Laterality: Right;   HERNIA REPAIR     INTRAMEDULLARY (IM) NAIL INTERTROCHANTERIC Left 09/30/2020   Procedure: INTRAMEDULLARY (IM) NAIL INTERTROCHANTRIC;  Surgeon: Signa Kell, MD;  Location: [facility] ORS;  Service: Orthopedics;  Laterality: Left;   MULTIPLE TOOTH EXTRACTIONS  06/2015   for dentures   UMBILICAL HERNIA REPAIR  02/25/2010     Allergies  Allergen Reactions   Celebrex [Celecoxib] Other (See Comments)    GI upset   Coconut (Cocos Nucifera)    Latex Itching    NEGATIVE LATEX IgE (< 0.10) 11/04/2016   Statins Other (See Comments)    Muscle pain   Strawberry (Diagnostic)    Adhesive [Tape] Rash    Outpatient Encounter Medications as of 07/30/2022  Medication Sig   acetaminophen (TYLENOL) 325 MG tablet Take 650 mg by mouth 3 (three) times daily.   alendronate (FOSAMAX) 70 MG tablet Take 1 tablet (70 mg total) by mouth once a week. Take with a full glass of water on an empty stomach.   calcium carbonate (TUMS - DOSED IN MG ELEMENTAL CALCIUM) 500 MG chewable tablet Chew 2 tablets (400 mg of elemental calcium total) by mouth 2 (two) times daily.   Carboxymethylcellulose Sod PF (REFRESH CELLUVISC) 1 % GEL Instill one drop in both eyes twice daily   CHLORHEXIDINE GLUCONATE, BULK, SOLN 15 mLs by Does not apply route in the morning and at bedtime.   cholecalciferol (VITAMIN D) 1000 units tablet Take 1,000 Units by mouth daily.   dextromethorphan-guaiFENesin (TUSSIN DM) 10-100 MG/5ML liquid Take 10 mLs by mouth every 4 (four) hours as needed for cough.   diclofenac Sodium (VOLTAREN) 1 % GEL Apply 2gms to right shoulder topically every 12 hours as needed for pain.   docusate sodium (COLACE) 100 MG capsule Take 100 mg by mouth daily.   donepezil (ARICEPT) 10 MG tablet Take 10 mg by mouth at bedtime.    Emollient (CETAPHIL) cream Apply topically as needed.   hydroxypropyl methylcellulose / hypromellose (ISOPTO TEARS / GONIOVISC) 2.5 % ophthalmic solution Place 1 drop into both eyes 2 (two) times daily as needed for dry eyes.   lactase (LACTAID) 3000 units tablet Take 6,000 Units by mouth 3 (three) times daily with meals.   magnesium hydroxide (MILK OF MAGNESIA) 400 MG/5ML suspension Take 30 mLs by mouth daily as needed for mild constipation.   memantine (NAMENDA) 10 MG tablet Take 10 mg by mouth 2 (two) times daily.    Multiple Vitamin (MULTIVITAMIN ADULT) TABS Take 1 tablet by mouth daily. SHAKLEE "LIFE STRIP" MULTIVITAMIN PACK   Multiple Vitamins-Minerals (PRESERVISION AREDS 2) CAPS Take 1 tablet by mouth in the morning and at bedtime.   neomycin-polymyxin b-dexamethasone (MAXITROL) 3.5-10000-0.1 SUSP Place 1 drop into both eyes 2 (two) times daily.   NP THYROID 15 MG tablet Take 15 mg by mouth every morning.   NP THYROID 60 MG tablet Take 60 mg by mouth in the morning.   nystatin (MYCOSTATIN/NYSTOP) powder Apply 1 Application topically every 12 (twelve) hours as needed.   olopatadine (PATANOL) 0.1 % ophthalmic solution Place 1 drop into the left eye daily.   sertraline (ZOLOFT) 100 MG tablet Take 150 mg by mouth at bedtime.   triamcinolone cream (KENALOG) 0.1 % Apply 1 Application topically every 12 (twelve) hours as needed.   No facility-administered encounter medications on file as of 07/30/2022.  Review of Systems  Immunization History  Administered Date(s) Administered   Covid-19, Mrna,Vaccine(Spikevax)67yrs and older 06/22/2022   Influenza-Unspecified 12/28/2011, 01/16/2013, 02/02/2014, 12/28/2014, 01/14/2015, 12/30/2015, 12/23/2016, 12/29/2021   Moderna Sars-Covid-2 Vaccination 03/26/2019, 04/23/2019   PPD Test 12/20/2016   Pneumococcal Conjugate (Pcv15) 09/01/2018   Pneumococcal-Unspecified 09/01/2018   Tdap 12/28/2013   Unspecified SARS-COV-2 Vaccination 08/11/2021   Pertinent  Health Maintenance Due  Topic Date Due   DEXA SCAN  Never done   INFLUENZA VACCINE  10/14/2022      04/03/2021    8:38 PM 04/04/2021    8:00 AM 04/04/2021    8:46 PM 04/05/2021    9:00 PM 04/06/2021   11:00 AM  Fall Risk  (RETIRED) Patient Fall Risk Level High fall risk High fall risk High fall risk High fall risk High fall risk   Functional Status Survey:    Vitals:   07/30/22 1456  BP: 126/65  Pulse: 78  Resp: 18  Temp: (!) 97.1 F (36.2 C)  SpO2: 95%  Weight: 160 lb 9.6 oz (72.8 kg)  Height: 5'  3" (1.6 m)   Body mass index is 28.45 kg/m. Physical Exam  Labs reviewed: Recent Labs    12/31/21 0000  NA 131*  K 4.4  CL 99  CO2 27*  BUN 8  CREATININE 0.8  CALCIUM 8.3*   Recent Labs    12/31/21 0000  AST 19  ALT 8  ALKPHOS 82  ALBUMIN 3.5   Recent Labs    12/31/21 0000 07/30/22 0000  WBC 7.0 7.7  NEUTROABS  --  5,498.00  HGB 12.4 11.7*  HCT 37 35*  PLT 247 274   Lab Results  Component Value Date   TSH 10.059 (H) 03/01/2019   No results found for: "HGBA1C" No results found for: "CHOL", "HDL", "LDLCALC", "LDLDIRECT", "TRIG", "CHOLHDL"  Significant Diagnostic Results in last 30 days:  No results found.  Assessment/Plan Cough in adult  Severe late onset Alzheimer's dementia with mood disturbance (HCC) Discussed patient's current status. Conversation outlined above. SLP ordered. CXR and CBC ordered to evaluate for pneumonia given chronicity of cough. Discussed acid reflux vs PND as well that could contribute to symptoms. F/u pending results.    Family/ staff Communication: Nursing, daughter  Labs/tests ordered:  CXR CBC  I spent greater than 20  minutes for the care of this patient in face to face time, chart review, clinical documentation, patient education. I spent an additional 16 minutes discussing goals of care and advanced care planning.

## 2022-08-24 ENCOUNTER — Non-Acute Institutional Stay (SKILLED_NURSING_FACILITY): Payer: Medicare Other | Admitting: Nurse Practitioner

## 2022-08-24 ENCOUNTER — Encounter: Payer: Self-pay | Admitting: Nurse Practitioner

## 2022-08-24 DIAGNOSIS — M150 Primary generalized (osteo)arthritis: Secondary | ICD-10-CM

## 2022-08-24 DIAGNOSIS — F330 Major depressive disorder, recurrent, mild: Secondary | ICD-10-CM

## 2022-08-24 DIAGNOSIS — I10 Essential (primary) hypertension: Secondary | ICD-10-CM

## 2022-08-24 DIAGNOSIS — G301 Alzheimer's disease with late onset: Secondary | ICD-10-CM

## 2022-08-24 DIAGNOSIS — F02C3 Dementia in other diseases classified elsewhere, severe, with mood disturbance: Secondary | ICD-10-CM

## 2022-08-24 DIAGNOSIS — E039 Hypothyroidism, unspecified: Secondary | ICD-10-CM

## 2022-08-24 DIAGNOSIS — M159 Polyosteoarthritis, unspecified: Secondary | ICD-10-CM

## 2022-08-24 DIAGNOSIS — M810 Age-related osteoporosis without current pathological fracture: Secondary | ICD-10-CM

## 2022-08-24 DIAGNOSIS — D509 Iron deficiency anemia, unspecified: Secondary | ICD-10-CM

## 2022-08-24 NOTE — Progress Notes (Unsigned)
Location:  Other [place].  Nursing Home Room Number: [facility] [address] Place of Service:  SNF [phone]) [name], NP  PCP: Earnestine Mealing, MD  Patient Care Team: Earnestine Mealing, MD as PCP - General [facility])  Extended Emergency Contact Information Primary Emergency Contact: Nile Riggs States of [place] Mobile Phone: [phone] Relation: Daughter Secondary Emergency Contact: Levert,fred Mobile Phone: [phone] Relation: Son  Goals of care: Advanced Directive information    08/24/2022   12:16 PM  Advanced Directives  Does Patient Have a Medical Advance Directive? Yes  Type of Estate agent of [place];Out of facility DNR (pink MOST or yellow form);Living will  Does patient want to make changes to medical advance directive? No - Patient declined  Copy of Healthcare Power of Attorney in Chart? Yes - validated most recent copy scanned in chart (See row information)     Chief Complaint  Patient presents with   Medical Management of Chronic Issues    Medical Management of Chronic Issues.     HPI:  Pt is a 89 y.o. female seen today for medical management of chronic disease.  Pt is a long term resident at [facility]. She has hx of dementia, depression, htn, hypothyroid, osteoporosis.  She was seen by ortho due to right shoulder pain and noted to have severe right rotator cuff tear causing degeneration of the shoulder. Continues on tylenol TID. Staff has no acute concerns today.    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   Depression    Fracture, metacarpal 06/2016   5th metacarpal   History of chicken pox    Hypertension    Hypothyroidism    Macular degeneration    left eye   Thyroid disease    Past Surgical History:  Procedure Laterality Date   CARPAL TUNNEL RELEASE  12/09/2010   CHOLECYSTECTOMY  1995   COLON SURGERY  02/25/2010   transverse colon  resection   COLONOSCOPY     ESOPHAGOGASTRODUODENOSCOPY  02/25/2010   GANGLION CYST EXCISION Right 07/26/2016   Procedure: REMOVAL GANGLION OF WRIST;  Surgeon: Donato Heinz, MD;  Location: [facility] ORS;  Service: Orthopedics;  Laterality: Right;   GANGLION CYST EXCISION Right 11/10/2016   Procedure: REMOVAL GANGLION OF WRIST;  Surgeon: Donato Heinz, MD;  Location: [facility] ORS;  Service: Orthopedics;  Laterality: Right;   HERNIA REPAIR     INTRAMEDULLARY (IM) NAIL INTERTROCHANTERIC Left 09/30/2020   Procedure: INTRAMEDULLARY (IM) NAIL INTERTROCHANTRIC;  Surgeon: Signa Kell, MD;  Location: [facility] ORS;  Service: Orthopedics;  Laterality: Left;   MULTIPLE TOOTH EXTRACTIONS  06/2015   for dentures   UMBILICAL HERNIA REPAIR  02/25/2010    Allergies  Allergen Reactions   Celebrex [Celecoxib] Other (See Comments)    GI upset   Coconut (Cocos Nucifera)    Latex Itching    NEGATIVE LATEX IgE (< 0.10) 11/04/2016   Statins Other (See Comments)    Muscle pain   Strawberry (Diagnostic)    Adhesive [Tape] Rash    Outpatient Encounter Medications as of 08/24/2022  Medication Sig   acetaminophen (TYLENOL) 325 MG tablet Take 650 mg by mouth 3 (three) times daily.   alendronate (FOSAMAX) 70 MG tablet Take 1 tablet (70 mg total) by mouth once a week. Take with a full glass of water on an empty stomach.   calcium carbonate (TUMS - DOSED IN MG ELEMENTAL CALCIUM) 500 MG chewable tablet Chew  2 tablets (400 mg of elemental calcium total) by mouth 2 (two) times daily.   Carboxymethylcellulose Sod PF (REFRESH CELLUVISC) 1 % GEL Instill one drop in both eyes twice daily   CHLORHEXIDINE GLUCONATE, BULK, SOLN 15 mLs by Does not apply route in the morning and at bedtime.   cholecalciferol (VITAMIN D) 1000 units tablet Take 1,000 Units by mouth daily.   dextromethorphan-guaiFENesin (TUSSIN DM) 10-100 MG/5ML liquid Take 10 mLs by mouth every 4 (four) hours as needed for cough.   diclofenac Sodium (VOLTAREN) 1 % GEL  Apply 2gms to right shoulder topically every 12 hours as needed for pain.   docusate sodium (COLACE) 100 MG capsule Take 100 mg by mouth daily.   donepezil (ARICEPT) 10 MG tablet Take 10 mg by mouth at bedtime.    Emollient (CETAPHIL) cream Apply topically as needed.   hydroxypropyl methylcellulose / hypromellose (ISOPTO TEARS / GONIOVISC) 2.5 % ophthalmic solution Place 1 drop into both eyes 2 (two) times daily as needed for dry eyes.   lactase (LACTAID) 3000 units tablet Take 6,000 Units by mouth 3 (three) times daily with meals.   magnesium hydroxide (MILK OF MAGNESIA) 400 MG/5ML suspension Take 30 mLs by mouth daily as needed for mild constipation.   memantine (NAMENDA) 10 MG tablet Take 10 mg by mouth 2 (two) times daily.   Multiple Vitamin (MULTIVITAMIN ADULT) TABS Take 1 tablet by mouth daily. SHAKLEE "LIFE STRIP" MULTIVITAMIN PACK   Multiple Vitamins-Minerals (PRESERVISION AREDS 2) CAPS Take 1 tablet by mouth in the morning and at bedtime.   NP THYROID 15 MG tablet Take 15 mg by mouth every morning.   NP THYROID 60 MG tablet Take 60 mg by mouth in the morning.   nystatin (MYCOSTATIN/NYSTOP) powder Apply 1 Application topically every 12 (twelve) hours as needed.   olopatadine (PATANOL) 0.1 % ophthalmic solution Place 1 drop into the left eye daily.   sertraline (ZOLOFT) 100 MG tablet Take 150 mg by mouth at bedtime.   [DISCONTINUED] neomycin-polymyxin b-dexamethasone (MAXITROL) 3.5-10000-0.1 SUSP Place 1 drop into both eyes 2 (two) times daily.   [DISCONTINUED] triamcinolone cream (KENALOG) 0.1 % Apply 1 Application topically every 12 (twelve) hours as needed.   No facility-administered encounter medications on file as of 08/24/2022.    Review of Systems  Unable to perform ROS: Dementia     Immunization History  Administered Date(s) Administered   Covid-19, Mrna,Vaccine(Spikevax)76yrs and older 06/22/2022   Influenza-Unspecified 12/28/2011, 01/16/2013, 02/02/2014, 12/28/2014,  01/14/2015, 12/30/2015, 12/23/2016, 12/29/2021   Moderna Sars-Covid-2 Vaccination 03/26/2019, 04/23/2019, 01/25/2020, 08/01/2020   PPD Test 12/20/2016   Pneumococcal Conjugate (Pcv15) 09/01/2018   Pneumococcal-Unspecified 09/01/2018   Tdap 12/28/2013   Unspecified SARS-COV-2 Vaccination 12/05/2020, 08/11/2021   Pertinent  Health Maintenance Due  Topic Date Due   DEXA SCAN  Never done   INFLUENZA VACCINE  10/14/2022      04/03/2021    8:38 PM 04/04/2021    8:00 AM 04/04/2021    8:46 PM 04/05/2021    9:00 PM 04/06/2021   11:00 AM  Fall Risk  (RETIRED) Patient Fall Risk Level High fall risk High fall risk High fall risk High fall risk High fall risk   Functional Status Survey:    Vitals:   08/24/22 1202  BP: (!) 120/58  Pulse: 82  Resp: 18  Temp: 97.6 F (36.4 C)  SpO2: 95%  Weight: 161 lb 3.2 oz (73.1 kg)  Height: 5\' 3"  (1.6 m)   Body mass index is 28.56 kg/m.  Physical Exam Constitutional:      General: She is not in acute distress.    Appearance: She is well-developed. She is not diaphoretic.  HENT:     Head: Normocephalic and atraumatic.     Mouth/Throat:     Pharynx: No oropharyngeal exudate.  Eyes:     Conjunctiva/sclera: Conjunctivae normal.     Pupils: Pupils are equal, round, and reactive to light.  Cardiovascular:     Rate and Rhythm: Normal rate and regular rhythm.     Heart sounds: Normal heart sounds.  Pulmonary:     Effort: Pulmonary effort is normal.     Breath sounds: Normal breath sounds.  Abdominal:     General: Bowel sounds are normal.     Palpations: Abdomen is soft.  Musculoskeletal:     Cervical back: Normal range of motion and neck supple.     Right lower leg: No edema.     Left lower leg: No edema.  Skin:    General: Skin is warm and dry.  Neurological:     Mental Status: She is alert.     Motor: Weakness present.     Gait: Gait abnormal.  Psychiatric:        Mood and Affect: Mood normal.     Labs reviewed: Recent Labs     12/31/21 0000  NA 131*  K 4.4  CL 99  CO2 27*  BUN 8  CREATININE 0.8  CALCIUM 8.3*   Recent Labs    12/31/21 0000  AST 19  ALT 8  ALKPHOS 82  ALBUMIN 3.5   Recent Labs    12/31/21 0000 07/30/22 0000  WBC 7.0 7.7  NEUTROABS  --  5,498.00  HGB 12.4 11.7*  HCT 37 35*  PLT 247 274   Lab Results  Component Value Date   TSH 10.059 (H) 03/01/2019   No results found for: "HGBA1C" No results found for: "CHOL", "HDL", "LDLCALC", "LDLDIRECT", "TRIG", "CHOLHDL"  Significant Diagnostic Results in last 30 days:  No results found.  Assessment/Plan 1. Iron deficiency anemia, unspecified iron deficiency anemia type -stable on recent labs, will continue to monitor  2. Severe late onset Alzheimer's dementia with mood disturbance (HCC) -Stable, no acute changes in cognitive or functional status, continue supportive care.   3. Mild episode of recurrent major depressive disorder (HCC) Well controlled on zoloft   4. Essential hypertension Blood pressure well controlled, goal bp <140/90 Continue current medications and dietary modifications follow metabolic panel  5. Hypothyroidism, unspecified type -continues on np thyroid, follow up tsh next lab day.  6. Primary osteoarthritis involving multiple joints Continues on tylenol scheduled.   7. Osteoporosis, unspecified osteoporosis type, unspecified pathological fracture presence -fall precautions and continues on fosamax 70 mg weekly with cal and vit d.      Shanda Bumps K. Biagio Borg [facility] [phone]

## 2022-08-26 LAB — BASIC METABOLIC PANEL
BUN: 16 (ref 4–21)
CO2: 27 — AB (ref 13–22)
Chloride: 99 (ref 99–108)
Creatinine: 0.9 (ref 0.5–1.1)
Glucose: 104
Potassium: 4.4 mEq/L (ref 3.5–5.1)
Sodium: 133 — AB (ref 137–147)

## 2022-08-26 LAB — COMPREHENSIVE METABOLIC PANEL
Albumin: 3.4 — AB (ref 3.5–5.0)
Calcium: 9.1 (ref 8.7–10.7)
Globulin: 3.8
eGFR: 60

## 2022-08-26 LAB — HEPATIC FUNCTION PANEL
ALT: 10 U/L (ref 7–35)
AST: 20 (ref 13–35)
Alkaline Phosphatase: 82 (ref 25–125)
Bilirubin, Total: 0.3

## 2022-08-26 LAB — TSH: TSH: 3.6 (ref 0.41–5.90)

## 2022-09-24 ENCOUNTER — Non-Acute Institutional Stay (SKILLED_NURSING_FACILITY): Payer: Medicare Other | Admitting: Student

## 2022-09-24 ENCOUNTER — Encounter: Payer: Self-pay | Admitting: Student

## 2022-09-24 DIAGNOSIS — D509 Iron deficiency anemia, unspecified: Secondary | ICD-10-CM | POA: Diagnosis not present

## 2022-09-24 DIAGNOSIS — S72145S Nondisplaced intertrochanteric fracture of left femur, sequela: Secondary | ICD-10-CM | POA: Diagnosis not present

## 2022-09-24 DIAGNOSIS — Z66 Do not resuscitate: Secondary | ICD-10-CM

## 2022-09-24 DIAGNOSIS — M19019 Primary osteoarthritis, unspecified shoulder: Secondary | ICD-10-CM

## 2022-09-24 DIAGNOSIS — F330 Major depressive disorder, recurrent, mild: Secondary | ICD-10-CM

## 2022-09-24 DIAGNOSIS — Z789 Other specified health status: Secondary | ICD-10-CM

## 2022-09-24 DIAGNOSIS — F0390 Unspecified dementia without behavioral disturbance: Secondary | ICD-10-CM | POA: Diagnosis not present

## 2022-09-24 NOTE — Progress Notes (Signed)
Location:  Other [place].  Nursing Home Room Number: [facility] [address] Place of Service:  SNF [phone]) Provider:  Earnestine Mealing, MD  Patient Care Team: Earnestine Mealing, MD as PCP - General [facility] Medicine)  Extended Emergency Contact Information Primary Emergency Contact: Nile Riggs States of [place] Mobile Phone: [phone] Relation: Daughter Secondary Emergency Contact: Mcfarlane,fred Mobile Phone: [phone] Relation: Son  Code Status:  DNR Goals of care: Advanced Directive information    09/24/2022   10:57 AM  Advanced Directives  Does Patient Have a Medical Advance Directive? Yes  Type of Estate agent of [place];Out of facility DNR (pink MOST or yellow form);Living will  Does patient want to make changes to medical advance directive? No - Patient declined  Copy of Healthcare Power of Attorney in Chart? Yes - validated most recent copy scanned in chart (See row information)     Chief Complaint  Patient presents with   Medical Management of Chronic Issues    Medical Management of Chronic Issues.     HPI:  Pt is a 89 y.o. female seen today for medical management of chronic diseases.    "I'm dying! Help before I bleed out." Oh, I'm not? Well, hlep me I've been poisened because another woman wants my husband!" My stomach is a little upset. She states she vomited (nurses states she has not needed any cleaned up). She is distracted by photos in the room and describes family. She states we are in [place]. She doesn't know some of the people in the photo and we should call her daughter Zella Ball  Nursing-- she periodically loses her dentures if she takes them out. There have been a lot of issues with which lift to use for her.   Called patient's daughter Zella Ball to discuss her current status. Concerned that changing her medication would be potentially detrimental for her mental health to make a medication change. They maintain relationship via  zoom. Her mother is always concerned someone else is coming for her husband, but she uses that as an opportunity to remind her that he has passed away. Awaiting the new lift to help with transfers. Decreased coughing noted on zooms. NO other concerns at this time.  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[facility])    adenocarcinoma of transverse colon   Depression    Fracture, metacarpal 06/2016   5th metacarpal   History of chicken pox    Hypertension    Hypothyroidism    Macular degeneration    left eye   Thyroid disease    Past Surgical History:  Procedure Laterality Date   CARPAL TUNNEL RELEASE  12/09/2010   CHOLECYSTECTOMY  1995   COLON SURGERY  02/25/2010   transverse colon resection   COLONOSCOPY     ESOPHAGOGASTRODUODENOSCOPY  02/25/2010   GANGLION CYST EXCISION Right 07/26/2016   Procedure: REMOVAL GANGLION OF WRIST;  Surgeon: Donato Heinz, MD;  Location: [facility] ORS;  Service: Orthopedics;  Laterality: Right;   GANGLION CYST EXCISION Right 11/10/2016   Procedure: REMOVAL GANGLION OF WRIST;  Surgeon: Donato Heinz, MD;  Location: [facility] ORS;  Service: Orthopedics;  Laterality: Right;   HERNIA REPAIR     INTRAMEDULLARY (IM) NAIL INTERTROCHANTERIC Left 09/30/2020   Procedure: INTRAMEDULLARY (IM) NAIL INTERTROCHANTRIC;  Surgeon: Signa Kell, MD;  Location: [facility] ORS;  Service: Orthopedics;  Laterality: Left;   MULTIPLE TOOTH EXTRACTIONS  06/2015   for dentures   UMBILICAL HERNIA REPAIR  02/25/2010  Allergies  Allergen Reactions   Celebrex [Celecoxib] Other (See Comments)    GI upset   Coconut (Cocos Nucifera)    Latex Itching    NEGATIVE LATEX IgE (< 0.10) 11/04/2016   Statins Other (See Comments)    Muscle pain   Strawberry (Diagnostic)    Adhesive [Tape] Rash    Outpatient Encounter Medications as of 09/24/2022  Medication Sig   acetaminophen (TYLENOL) 325 MG tablet Take 650 mg by mouth 3 (three) times daily.   alendronate (FOSAMAX) 70 MG tablet Take 1  tablet (70 mg total) by mouth once a week. Take with a full glass of water on an empty stomach.   calcium carbonate (TUMS - DOSED IN MG ELEMENTAL CALCIUM) 500 MG chewable tablet Chew 2 tablets (400 mg of elemental calcium total) by mouth 2 (two) times daily.   Carboxymethylcellulose Sod PF (REFRESH CELLUVISC) 1 % GEL Instill one drop in both eyes twice daily   CHLORHEXIDINE GLUCONATE, BULK, SOLN 15 mLs by Does not apply route in the morning and at bedtime.   cholecalciferol (VITAMIN D) 1000 units tablet Take 1,000 Units by mouth daily.   dextromethorphan-guaiFENesin (TUSSIN DM) 10-100 MG/5ML liquid Take 10 mLs by mouth every 4 (four) hours as needed for cough.   diclofenac Sodium (VOLTAREN) 1 % GEL Apply 2gms to right shoulder topically every 12 hours as needed for pain.   donepezil (ARICEPT) 10 MG tablet Take 10 mg by mouth at bedtime.    Emollient (CETAPHIL) cream Apply topically as needed.   hydroxypropyl methylcellulose / hypromellose (ISOPTO TEARS / GONIOVISC) 2.5 % ophthalmic solution Place 1 drop into both eyes 2 (two) times daily as needed for dry eyes.   lactase (LACTAID) 3000 units tablet Take 6,000 Units by mouth 3 (three) times daily with meals.   magnesium hydroxide (MILK OF MAGNESIA) 400 MG/5ML suspension Take 30 mLs by mouth daily as needed for mild constipation.   memantine (NAMENDA) 10 MG tablet Take 10 mg by mouth 2 (two) times daily.   Multiple Vitamins-Minerals (PRESERVISION AREDS 2) CAPS Take 1 tablet by mouth in the morning and at bedtime.   NP THYROID 15 MG tablet Take 15 mg by mouth every morning.   NP THYROID 60 MG tablet Take 60 mg by mouth in the morning.   nystatin (MYCOSTATIN/NYSTOP) powder Apply 1 Application topically every 12 (twelve) hours as needed.   olopatadine (PATANOL) 0.1 % ophthalmic solution Place 1 drop into the left eye daily.   polyethylene glycol powder (GLYCOLAX/MIRALAX) 17 GM/SCOOP powder One scoop by mouth in the morning every Monday, Wednesday and  Friday.   sertraline (ZOLOFT) 100 MG tablet Take 100 mg by mouth at bedtime.   [DISCONTINUED] docusate sodium (COLACE) 100 MG capsule Take 100 mg by mouth daily.   [DISCONTINUED] Multiple Vitamin (MULTIVITAMIN ADULT) TABS Take 1 tablet by mouth daily. SHAKLEE "LIFE STRIP" MULTIVITAMIN PACK   No facility-administered encounter medications on file as of 09/24/2022.    Review of Systems  Immunization History  Administered Date(s) Administered   Covid-19, Mrna,Vaccine(Spikevax)42yrs and older 06/22/2022   Influenza-Unspecified 12/28/2011, 01/16/2013, 02/02/2014, 12/28/2014, 01/14/2015, 12/30/2015, 12/23/2016, 12/29/2021   Moderna Sars-Covid-2 Vaccination 03/26/2019, 04/23/2019, 01/25/2020, 08/01/2020   PPD Test 12/20/2016   Pneumococcal Conjugate (Pcv15) 09/01/2018   Pneumococcal-Unspecified 09/01/2018   Tdap 12/28/2013   Unspecified SARS-COV-2 Vaccination 12/05/2020, 08/11/2021   Pertinent  Health Maintenance Due  Topic Date Due   DEXA SCAN  Never done   INFLUENZA VACCINE  10/14/2022  04/03/2021    8:38 PM 04/04/2021    8:00 AM 04/04/2021    8:46 PM 04/05/2021    9:00 PM 04/06/2021   11:00 AM  Fall Risk  (RETIRED) Patient Fall Risk Level High fall risk High fall risk High fall risk High fall risk High fall risk   Functional Status Survey:    Vitals:   09/24/22 1048  BP: 139/77  Pulse: 80  Temp: (!) 97.2 F (36.2 C)  SpO2: 92%  Weight: 159 lb 3.2 oz (72.2 kg)  Height: 5\' 3"  (1.6 m)   Body mass index is 28.2 kg/m. Physical Exam Constitutional:      Appearance: Normal appearance.  Cardiovascular:     Rate and Rhythm: Normal rate and regular rhythm.     Pulses: Normal pulses.  Pulmonary:     Effort: Pulmonary effort is normal.  Abdominal:     General: Abdomen is flat.     Palpations: Abdomen is soft.  Skin:    General: Skin is warm and dry.  Neurological:     Mental Status: She is alert. Mental status is at baseline. She is disoriented.     Labs  reviewed: Recent Labs    12/31/21 0000 08/26/22 0000  NA 131* 133*  K 4.4 4.4  CL 99 99  CO2 27* 27*  BUN 8 16  CREATININE 0.8 0.9  CALCIUM 8.3* 9.1   Recent Labs    12/31/21 0000 08/26/22 0000  AST 19 20  ALT 8 10  ALKPHOS 82 82  ALBUMIN 3.5 3.4*   Recent Labs    12/31/21 0000 07/30/22 0000  WBC 7.0 7.7  NEUTROABS  --  5,498.00  HGB 12.4 11.7*  HCT 37 35*  PLT 247 274   Lab Results  Component Value Date   TSH 3.60 08/26/2022   No results found for: "HGBA1C" No results found for: "CHOL", "HDL", "LDLCALC", "LDLDIRECT", "TRIG", "CHOLHDL"  Significant Diagnostic Results in last 30 days:  No results found.  Assessment/Plan Dementia, old age, without behavioral disturbance (HCC)  Closed nondisplaced intertrochanteric fracture of left femur, sequela  Mild episode of recurrent major depressive disorder (HCC)  Iron deficiency anemia, unspecified iron deficiency anemia type  Do not resuscitate  DNI (do not intubate)  Arthritis of glenohumeral joint Patient's dementia is stable without acute decline at this time. She is continent of urine, wheelchair dependent, disoriented. She feeds herself independently. Weight is stable. Continue supportive care. Hx of femur fracture 09/2020 which is why she requires supportive care. She requires a lift for transfer. Severe arthritis of the shoulder complicates transfer assistance, followed by PT/OT for most appropriate assistive device. Patient maintains DNR/DNI status. Mood, patient  continues to make statements about "being ready to die" but does not have any intent of self harm. Will continue sertraline at current dose given risk of decline and family preference.   Family/ staff Communication: nursing, Zella Ball  Labs/tests ordered:  none

## 2022-10-21 ENCOUNTER — Non-Acute Institutional Stay (INDEPENDENT_AMBULATORY_CARE_PROVIDER_SITE_OTHER): Payer: Medicare Other | Admitting: Nurse Practitioner

## 2022-10-21 ENCOUNTER — Other Ambulatory Visit: Payer: Self-pay | Admitting: Nurse Practitioner

## 2022-10-21 ENCOUNTER — Encounter: Payer: Self-pay | Admitting: Nurse Practitioner

## 2022-10-21 DIAGNOSIS — Z0000 Encounter for general adult medical examination without abnormal findings: Secondary | ICD-10-CM | POA: Diagnosis not present

## 2022-10-21 NOTE — Progress Notes (Signed)
Subjective:   Sonya Carey is a 89 y.o. female who presents for Medicare Annual (Subsequent) preventive examination.  Visit Complete: In person  Review of Systems     Cardiac Risk Factors include: advanced age (>57men, >71 women);hypertension     Objective:    Today's Vitals   10/21/22 1424 10/21/22 1438  BP: (!) 149/75 124/64  Pulse: 73   Resp: 18   Temp: (!) 97.2 F (36.2 C)   SpO2: 96%   Weight: 154 lb 3.2 oz (69.9 kg)   Height: 5\' 3"  (1.6 m)    Body mass index is 27.32 kg/m.     10/21/2022    2:36 PM 09/24/2022   10:57 AM 08/24/2022   12:16 PM 07/30/2022    3:11 PM 07/09/2022    9:42 AM 04/27/2022    2:12 PM 03/10/2022   10:02 AM  Advanced Directives  Does Patient Have a Medical Advance Directive? Yes Yes Yes Yes Yes Yes Yes  Type of Estate agent of [place];Out of facility DNR (pink MOST or yellow form);Living will Healthcare Power of [place];Out of facility DNR (pink MOST or yellow form);Living will Healthcare Power of [place];Out of facility DNR (pink MOST or yellow form);Living will Healthcare Power of [place];Out of facility DNR (pink MOST or yellow form);Living will Healthcare Power of [place];Living will;Out of facility DNR (pink MOST or yellow form) Healthcare Power of [place];Living will;Out of facility DNR (pink MOST or yellow form) Healthcare Power of [place];Out of facility DNR (pink MOST or yellow form)  Does patient want to make changes to medical advance directive? No - Patient declined No - Patient declined No - Patient declined No - Patient declined No - Patient declined No - Patient declined No - Patient declined  Copy of Healthcare Power of Attorney in Chart? Yes - validated most recent copy scanned in chart (See row information) Yes - validated most recent copy scanned in chart (See row information) Yes - validated most recent copy scanned in chart (See row information) Yes - validated most recent copy scanned in chart (See  row information) Yes - validated most recent copy scanned in chart (See row information) Yes - validated most recent copy scanned in chart (See row information)   Pre-existing out of facility DNR order (yellow form or pink MOST form)     Yellow form placed in chart (order not valid for inpatient use) Yellow form placed in chart (order not valid for inpatient use)     Current Medications (verified) Outpatient Encounter Medications as of 10/21/2022  Medication Sig   acetaminophen (TYLENOL) 325 MG tablet Take 650 mg by mouth 3 (three) times daily.   alendronate (FOSAMAX) 70 MG tablet Take 1 tablet (70 mg total) by mouth once a week. Take with a full glass of water on an empty stomach.   calcium carbonate (TUMS - DOSED IN MG ELEMENTAL CALCIUM) 500 MG chewable tablet Chew 2 tablets (400 mg of elemental calcium total) by mouth 2 (two) times daily.   Carboxymethylcellulose Sod PF (REFRESH CELLUVISC) 1 % GEL Instill one drop in both eyes twice daily   CHLORHEXIDINE GLUCONATE, BULK, SOLN 15 mLs by Does not apply route in the morning and at bedtime.   cholecalciferol (VITAMIN D) 1000 units tablet Take 1,000 Units by mouth daily.   dextromethorphan-guaiFENesin (TUSSIN DM) 10-100 MG/5ML liquid Take 10 mLs by mouth every 4 (four) hours as needed for cough.   diclofenac Sodium (VOLTAREN) 1 % GEL Apply 2gms to right shoulder  topically every 12 hours as needed for pain.   donepezil (ARICEPT) 10 MG tablet Take 10 mg by mouth at bedtime.    Emollient (CETAPHIL) cream Apply topically as needed.   hydroxypropyl methylcellulose / hypromellose (ISOPTO TEARS / GONIOVISC) 2.5 % ophthalmic solution Place 1 drop into both eyes 2 (two) times daily as needed for dry eyes.   lactase (LACTAID) 3000 units tablet Take 6,000 Units by mouth 3 (three) times daily with meals.   magnesium hydroxide (MILK OF MAGNESIA) 400 MG/5ML suspension Take 30 mLs by mouth daily as needed for mild constipation.   memantine (NAMENDA) 10 MG tablet  Take 10 mg by mouth 2 (two) times daily.   Multiple Vitamins-Minerals (PRESERVISION AREDS 2) CAPS Take 1 tablet by mouth in the morning and at bedtime.   NP THYROID 15 MG tablet Take 15 mg by mouth every morning.   NP THYROID 60 MG tablet Take 60 mg by mouth in the morning.   nystatin (MYCOSTATIN/NYSTOP) powder Apply 1 Application topically every 12 (twelve) hours as needed.   olopatadine (PATANOL) 0.1 % ophthalmic solution Place 1 drop into the left eye daily.   polyethylene glycol powder (GLYCOLAX/MIRALAX) 17 GM/SCOOP powder One scoop by mouth in the morning every Monday, Wednesday and Friday.   sertraline (ZOLOFT) 100 MG tablet Take 150 mg by mouth at bedtime.   No facility-administered encounter medications on file as of 10/21/2022.    Allergies (verified) Celebrex [celecoxib], Coconut (cocos nucifera), Latex, Statins, Strawberry (diagnostic), and Adhesive [tape]   History: 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(HCC)    adenocarcinoma of transverse colon   Depression    Fracture, metacarpal 06/2016   5th metacarpal   History of chicken pox    Hypertension    Hypothyroidism    Macular degeneration    left eye   Thyroid disease    Past Surgical History:  Procedure Laterality Date   CARPAL TUNNEL RELEASE  12/09/2010   CHOLECYSTECTOMY  1995   COLON SURGERY  02/25/2010   transverse colon resection   COLONOSCOPY     ESOPHAGOGASTRODUODENOSCOPY  02/25/2010   GANGLION CYST EXCISION Right 07/26/2016   Procedure: REMOVAL GANGLION OF WRIST;  Surgeon: Donato Heinz, MD;  Location: [facility] ORS;  Service: Orthopedics;  Laterality: Right;   GANGLION CYST EXCISION Right 11/10/2016   Procedure: REMOVAL GANGLION OF WRIST;  Surgeon: Donato Heinz, MD;  Location: [facility] OR[facility];  Service: Orthopedics;  Laterality: Right;   HERNIA REPAIR     INTRAMEDULLARY (IM) NAIL INTERTROCHANTERIC Left 09/30/2020   Procedure: INTRAMEDULLARY (IM) NAIL INTERTROCHANTRIC;  Surgeon: Signa Kell, MD;   Location: [facility] OR[facility];  Service: Orthopedics;  Laterality: Left;   MULTIPLE TOOTH EXTRACTIONS  06/2015   for dentures   UMBILICAL HERNIA REPAIR  02/25/2010   Family History  Problem Relation Age of Onset   Dementia Mother    Social History   Socioeconomic History   Marital status: Widowed    Spouse name: Not on file   Number of children: Not on file   Years of education: Not on file   Highest education level: Not on file  Occupational History   Not on file  Tobacco Use   Smoking status: Former   Smokeless tobacco: Never  Vaping Use   Vaping status: Never Used  Substance and Sexual Activity   Alcohol use: No   Drug use: No   Sexual activity: Not on file  Other Topics Concern   Not on file  Social History Narrative   Not on file   Social Determinants of Health   Financial Resource Strain: Not on file  Food Insecurity: Not on file  Transportation Needs: Not on file  Physical Activity: Not on file  Stress: Not on file  Social Connections: Not on file    Tobacco Counseling Counseling given: Not Answered   Clinical Intake:  Pre-visit preparation completed: Yes  Pain : No/denies pain     BMI - recorded: 28 Nutritional Status: BMI 25 -29 Overweight Nutritional Risks: None Diabetes: No  How often do you need to have someone help you when you read instructions, pamphlets, or other written materials from your doctor or pharmacy?: 5 - Always         Activities of Daily Living    10/21/2022   12:32 PM  In your present state of health, do you have any difficulty performing the following activities:  Hearing? 1  Vision? 0  Difficulty concentrating or making decisions? 1  Walking or climbing stairs? 1  Dressing or bathing? 1  Doing errands, shopping? 1  Preparing Food and eating ? Y  Using the Toilet? Y  In the past six months, have you accidently leaked urine? Y  Do you have problems with loss of bowel control? N  Managing your Medications? Y  Managing  your Finances? Y  Housekeeping or managing your Housekeeping? Y    Patient Care Team: Earnestine Mealing, MD as PCP - General (Family Medicine)  Indicate any recent Medical Services you may have received from other than [facility] providers in the past year (date may be approximate).     Assessment:   This is a routine wellness examination for Marylee.  Hearing/Vision screen No results found.  Dietary issues and exercise activities discussed:     Goals Addressed   None   Depression Screen     No data to display          Fall Risk    10/21/2022   12:35 PM  Fall Risk   Falls in the past year? 0  Number falls in past yr: 0  Risk for fall due to : Impaired balance/gait;Impaired mobility  Follow up Falls evaluation completed    MEDICARE RISK AT HOME:   TIMED UP AND GO:  Was the test performed?  No    Cognitive Function:        Immunizations Immunization History  Administered Date(s) Administered   Covid-19, Mrna,Vaccine(Spikevax)59yrs and older 06/22/2022   Influenza-Unspecified 12/28/2011, 01/16/2013, 02/02/2014, 12/28/2014, 01/14/2015, 12/30/2015, 12/23/2016, 12/29/2021   Moderna Sars-Covid-2 Vaccination 03/26/2019, 04/23/2019, 01/25/2020, 08/01/2020   PPD Test 12/20/2016   Pneumococcal Conjugate (Pcv15) 09/01/2018   Pneumococcal-Unspecified 09/01/2018   Tdap 12/28/2013   Unspecified SARS-COV-2 Vaccination 12/05/2020, 08/11/2021    TDAP status: Up to date  Flu Vaccine status: Due, Education has been provided regarding the importance of this vaccine. Advised may receive this vaccine at local pharmacy or Health Dept. Aware to provide a copy of the vaccination record if obtained from local pharmacy or Health Dept. Verbalized acceptance and understanding.  Pneumococcal vaccine status: Due, Education has been provided regarding the importance of this vaccine. Advised may receive this vaccine at local pharmacy or Health Dept. Aware to provide a copy of the vaccination  record if obtained from local pharmacy or Health Dept. Verbalized acceptance and understanding.  Covid-19 vaccine status: Information provided on how to obtain vaccines.   Qualifies for Shingles Vaccine? Yes   Zostavax completed No   Shingrix  Completed?: No.    Education has been provided regarding the importance of this vaccine. Patient has been advised to call insurance company to determine out of pocket expense if they have not yet received this vaccine. Advised may also receive vaccine at local pharmacy or Health Dept. Verbalized acceptance and understanding.  Screening Tests Health Maintenance  Topic Date Due   Zoster Vaccines- Shingrix (1 of 2) Never done   Pneumonia Vaccine 30+ Years old (2 of 2 - PPSV23 or PCV20) 09/01/2019   COVID-19 Vaccine (8 - 2023-24 season) 08/17/2022   INFLUENZA VACCINE  10/14/2022   Medicare Annual Wellness (AWV)  10/21/2023   DTaP/Tdap/Td (2 - Td or Tdap) 12/29/2023   HPV VACCINES  Aged Out   DEXA SCAN  Discontinued    Health Maintenance  Health Maintenance Due  Topic Date Due   Zoster Vaccines- Shingrix (1 of 2) Never done   Pneumonia Vaccine 81+ Years old (2 of 2 - PPSV23 or PCV20) 09/01/2019   COVID-19 Vaccine (8 - 2023-24 season) 08/17/2022   INFLUENZA VACCINE  10/14/2022    Colorectal cancer screening: No longer required.   Mammogram status: No longer required due to age.  NA candidate for bone density   Lung Cancer Screening: (Low Dose CT Chest recommended if Age 74-80 years, 20 pack-year currently smoking OR have quit w/in 15years.) does not qualify.   Lung Cancer Screening Referral: na  Additional Screening:  Hepatitis C Screening: does not qualify  Vision Screening: Recommended annual ophthalmology exams for early detection of glaucoma and other disorders of the eye. Is the patient up to date with their annual eye exam?  Yes  Who is the provider or what is the name of the office in which the patient attends annual eye exams?  Onsite ophthalmology If pt is not established with a provider, would they like to be referred to a provider to establish care? No .   Dental Screening: Recommended annual dental exams for proper oral hygiene  Community Resource Referral / Chronic Care Management: CRR required this visit?  No   CCM required this visit?  No     Plan:     I have personally reviewed and noted the following in the patient's chart:   Medical and social history Use of alcohol, tobacco or illicit drugs  Current medications and supplements including opioid prescriptions. Patient is not currently taking opioid prescriptions. Functional ability and status Nutritional status Physical activity Advanced directives List of other physicians Hospitalizations, surgeries, and ER visits in previous 12 months Vitals Screenings to include cognitive, depression, and falls Referrals and appointments  In addition, I have reviewed and discussed with patient certain preventive protocols, quality metrics, and best practice recommendations. A written personalized care plan for preventive services as well as general preventive health recommendations were provided to patient.     Sharon Seller, NP   10/21/2022

## 2022-10-21 NOTE — Patient Instructions (Signed)
  Sonya Carey , Thank you for taking time to come for your Medicare Wellness Visit. I appreciate your ongoing commitment to your health goals. Please review the following plan we discussed and let me know if I can assist you in the future.   These are the goals we discussed:  Goals   None     This is a list of the screening recommended for you and due dates:  Health Maintenance  Topic Date Due   Zoster (Shingles) Vaccine (1 of 2) Never done   Pneumonia Vaccine (2 of 2 - PPSV23 or PCV20) 09/01/2019   COVID-19 Vaccine (8 - 2023-24 season) 08/17/2022   Flu Shot  10/14/2022   Medicare Annual Wellness Visit  10/21/2023   DTaP/Tdap/Td vaccine (2 - Td or Tdap) 12/29/2023   HPV Vaccine  Aged Out   DEXA scan (bone density measurement)  Discontinued

## 2022-11-18 ENCOUNTER — Encounter: Payer: Self-pay | Admitting: Nurse Practitioner

## 2022-11-18 ENCOUNTER — Non-Acute Institutional Stay (SKILLED_NURSING_FACILITY): Payer: Medicare Other | Admitting: Nurse Practitioner

## 2022-11-18 DIAGNOSIS — E871 Hypo-osmolality and hyponatremia: Secondary | ICD-10-CM | POA: Diagnosis not present

## 2022-11-18 DIAGNOSIS — E039 Hypothyroidism, unspecified: Secondary | ICD-10-CM

## 2022-11-18 DIAGNOSIS — F330 Major depressive disorder, recurrent, mild: Secondary | ICD-10-CM

## 2022-11-18 DIAGNOSIS — D509 Iron deficiency anemia, unspecified: Secondary | ICD-10-CM

## 2022-11-18 DIAGNOSIS — M159 Polyosteoarthritis, unspecified: Secondary | ICD-10-CM

## 2022-11-18 DIAGNOSIS — F03918 Unspecified dementia, unspecified severity, with other behavioral disturbance: Secondary | ICD-10-CM | POA: Diagnosis not present

## 2022-11-18 DIAGNOSIS — M810 Age-related osteoporosis without current pathological fracture: Secondary | ICD-10-CM

## 2022-11-18 DIAGNOSIS — K5904 Chronic idiopathic constipation: Secondary | ICD-10-CM

## 2022-11-18 NOTE — Progress Notes (Signed)
Location:  Other Nursing Home Room Number: [facility] [address] Place of Service:  SNF ([phone])  Sydnee Cabal, [place], MD  Patient Care Team: Earnestine Mealing, MD as PCP - General [facility] Medicine)  Extended Emergency Contact Information Primary Emergency Contact: Nile Riggs [place] Mobile Phone: [phone] Relation: Daughter Secondary Emergency Contact: Giampietro,fred Mobile Phone: [phone] Relation: Son  Goals of care: Advanced Directive information    11/18/2022    2:28 PM  Advanced Directives  Does Patient Have a Medical Advance Directive? Yes  Type of Estate agent of [place];Out of facility DNR (pink MOST or yellow form);Living will  Does patient want to make changes to medical advance directive? No - Patient declined  Copy of Healthcare Power of Attorney in Chart? Yes - validated most recent copy scanned in chart (See row information)     Chief Complaint  Patient presents with   Medical Management of Chronic Issues    Medical Management of Chronic Issues.     HPI:  Pt is a 89 y.o. female seen today for medical management of chronic disease.  Staff reports she is not eating much. Has a hard time chewing meat. Weight has been unchanged in the last month. She does take nutritional supplement.  She feels She throws food in the floor and will not allow staff to assist her with meals. like someone is poising her or there is "bird shit" on her food and then will not eat it.  She does cough at meals but also will cough when not eating ST has seen her and gave modifications but cough is unchanged. Recently changed diet back to regular because she was not eating her puree.   In the past daughter has not wanted to adjust medication due to potentially causing more harm therefore medication has been continued at current dose.  Theses behaviors have been ongoing per nursing without any major changes.     Past Medical History:  Diagnosis  Date   Colon cancer [facility])    adenocarcinoma of transverse colon   Depression    Fracture, metacarpal 06/2016   5th metacarpal   History of chicken pox    Hypertension    Hypothyroidism    Macular degeneration    left eye   Thyroid disease    Past Surgical History:  Procedure Laterality Date   CARPAL TUNNEL RELEASE  12/09/2010   CHOLECYSTECTOMY  1995   COLON SURGERY  02/25/2010   transverse colon resection   COLONOSCOPY     ESOPHAGOGASTRODUODENOSCOPY  02/25/2010   GANGLION CYST EXCISION Right 07/26/2016   Procedure: REMOVAL GANGLION OF WRIST;  Surgeon: Donato Heinz, MD;  Location: [facility] ORS;  Service: Orthopedics;  Laterality: Right;   GANGLION CYST EXCISION Right 11/10/2016   Procedure: REMOVAL GANGLION OF WRIST;  Surgeon: Donato Heinz, MD;  Location: [facility] ORS;  Service: Orthopedics;  Laterality: Right;   HERNIA REPAIR     INTRAMEDULLARY (IM) NAIL INTERTROCHANTERIC Left 09/30/2020   Procedure: INTRAMEDULLARY (IM) NAIL INTERTROCHANTRIC;  Surgeon: Signa Kell, MD;  Location: [facility] ORS;  Service: Orthopedics;  Laterality: Left;   MULTIPLE TOOTH EXTRACTIONS  06/2015   for dentures   UMBILICAL HERNIA REPAIR  02/25/2010    Allergies  Allergen Reactions   Celebrex [Celecoxib] Other (See Comments)    GI upset   Coconut (Cocos Nucifera)    Latex Itching    NEGATIVE LATEX IgE (< 0.10) 11/04/2016   Statins Other (See Comments)    Muscle pain  Strawberry (Diagnostic)    Adhesive [Tape] Rash    Outpatient Encounter Medications as of 11/18/2022  Medication Sig   acetaminophen (TYLENOL) 325 MG tablet Take 650 mg by mouth 3 (three) times daily.   alendronate (FOSAMAX) 70 MG tablet Take 1 tablet (70 mg total) by mouth once a week. Take with a full glass of water on an empty stomach.   calcium carbonate (TUMS - DOSED IN MG ELEMENTAL CALCIUM) 500 MG chewable tablet Chew 2 tablets (400 mg of elemental calcium total) by mouth 2 (two) times daily.   Carboxymethylcellulose Sod PF (REFRESH  CELLUVISC) 1 % GEL Instill one drop in both eyes twice daily   CHLORHEXIDINE GLUCONATE, BULK, SOLN 15 mLs by Does not apply route in the morning and at bedtime.   cholecalciferol (VITAMIN D) 1000 units tablet Take 1,000 Units by mouth daily.   dextromethorphan-guaiFENesin (TUSSIN DM) 10-100 MG/5ML liquid Take 10 mLs by mouth every 4 (four) hours as needed for cough.   diclofenac Sodium (VOLTAREN) 1 % GEL Apply 2gms to right shoulder topically every 12 hours as needed for pain.   donepezil (ARICEPT) 10 MG tablet Take 10 mg by mouth at bedtime.    Emollient (CETAPHIL) cream Apply topically as needed.   hydroxypropyl methylcellulose / hypromellose (ISOPTO TEARS / GONIOVISC) 2.5 % ophthalmic solution Place 1 drop into both eyes 2 (two) times daily as needed for dry eyes.   lactase (LACTAID) 3000 units tablet Take 6,000 Units by mouth 3 (three) times daily with meals.   magnesium hydroxide (MILK OF MAGNESIA) 400 MG/5ML suspension Take 30 mLs by mouth daily as needed for mild constipation.   memantine (NAMENDA) 10 MG tablet Take 10 mg by mouth 2 (two) times daily.   Multiple Vitamin (THEREMS PO) Take 1 tablet by mouth daily.   NP THYROID 15 MG tablet Take 15 mg by mouth every morning.   NP THYROID 60 MG tablet Take 60 mg by mouth in the morning.   nystatin (MYCOSTATIN/NYSTOP) powder Apply 1 Application topically every 12 (twelve) hours as needed.   olopatadine (PATANOL) 0.1 % ophthalmic solution Place 1 drop into the left eye daily.   polyethylene glycol powder (GLYCOLAX/MIRALAX) 17 GM/SCOOP powder One scoop by mouth in the morning every Monday, Wednesday and Friday.   sertraline (ZOLOFT) 100 MG tablet Take 150 mg by mouth at bedtime.   [DISCONTINUED] Multiple Vitamins-Minerals (PRESERVISION AREDS 2) CAPS Take 1 tablet by mouth in the morning and at bedtime.   No facility-administered encounter medications on file as of 11/18/2022.    Review of Systems  Unable to perform ROS: Dementia      Immunization History  Administered Date(s) Administered   Covid-19, Mrna,Vaccine(Spikevax)69yrs and older 06/22/2022   Influenza-Unspecified 12/28/2011, 01/16/2013, 02/02/2014, 12/28/2014, 01/14/2015, 12/30/2015, 12/23/2016, 12/29/2021   Moderna Sars-Covid-2 Vaccination 03/26/2019, 04/23/2019, 01/25/2020, 08/01/2020   PPD Test 12/20/2016   Pneumococcal Conjugate (Pcv15) 09/01/2018   Pneumococcal-Unspecified 09/01/2018   Tdap 12/28/2013   Unspecified SARS-COV-2 Vaccination 12/05/2020, 08/11/2021   Zoster Recombinant(Shingrix) 10/30/2022   Pertinent  Health Maintenance Due  Topic Date Due   INFLUENZA VACCINE  10/14/2022   DEXA SCAN  Discontinued      04/04/2021    8:00 AM 04/04/2021    8:46 PM 04/05/2021    9:00 PM 04/06/2021   11:00 AM 10/21/2022   12:35 PM  Fall Risk  Falls in the past year?     0  (RETIRED) Patient Fall Risk Level High fall risk High fall risk High fall  risk High fall risk   Patient at Risk for Falls Due to     Impaired balance/gait;Impaired mobility  Fall risk Follow up     Falls evaluation completed   Functional Status Survey:    Vitals:   11/18/22 1413  BP: (!) 111/55  Pulse: 75  Resp: 17  Temp: 98 F (36.7 C)  SpO2: 96%  Weight: 155 lb 6.4 oz (70.5 kg)  Height: 5\' 3"  (1.6 m)   Body mass index is 27.53 kg/m. Physical Exam Constitutional:      General: She is not in acute distress.    Appearance: She is well-developed. She is not diaphoretic.  HENT:     Head: Normocephalic and atraumatic.     Mouth/Throat:     Pharynx: No oropharyngeal exudate.  Eyes:     Conjunctiva/sclera: Conjunctivae normal.     Pupils: Pupils are equal, round, and reactive to light.  Cardiovascular:     Rate and Rhythm: Normal rate and regular rhythm.     Heart sounds: Normal heart sounds.  Pulmonary:     Effort: Pulmonary effort is normal.     Breath sounds: Normal breath sounds.  Abdominal:     General: Bowel sounds are normal.     Palpations: Abdomen is soft.   Musculoskeletal:     Cervical back: Normal range of motion and neck supple.     Right lower leg: No edema.     Left lower leg: No edema.  Skin:    General: Skin is warm and dry.  Neurological:     Mental Status: She is alert. She is disoriented.     Motor: Weakness present.     Gait: Gait abnormal.  Psychiatric:        Mood and Affect: Mood normal.    Labs reviewed: Recent Labs    12/31/21 0000 08/26/22 0000  NA 131* 133*  K 4.4 4.4  CL 99 99  CO2 27* 27*  BUN 8 16  CREATININE 0.8 0.9  CALCIUM 8.3* 9.1   Recent Labs    12/31/21 0000 08/26/22 0000  AST 19 20  ALT 8 10  ALKPHOS 82 82  ALBUMIN 3.5 3.4*   Recent Labs    12/31/21 0000 07/30/22 0000  WBC 7.0 7.7  NEUTROABS  --  5,498.00  HGB 12.4 11.7*  HCT 37 35*  PLT 247 274   Lab Results  Component Value Date   TSH 3.60 08/26/2022   No results found for: "HGBA1C" No results found for: "CHOL", "HDL", "LDLCALC", "LDLDIRECT", "TRIG", "CHOLHDL"  Significant Diagnostic Results in last 30 days:  No results found.  Assessment/Plan 1. Dementia with behavioral disturbance (HCC) -Stable, no acute changes in cognitive or functional status, continues with progressive decline, continue namenda with  supportive care.   2. Hypothyroidism, unspecified type TSH at goal in June.   4. Mild episode of recurrent major depressive disorder (HCC) Stable on zoloft 150 mg daily, will not adjust at risk for adverse reaction.   5. Hyponatremia Stable on last lab, continue to follow bmp  6. Chronic idiopathic constipation Controlled on current regimen.   7. Osteoporosis, unspecified osteoporosis type, unspecified pathological fracture presence Continues on fosamax with cal and vit d  8. Primary osteoarthritis involving multiple joints -stable, continues on tylenol TID    [place] Antuna K. Biagio Borg [facility] [phone]

## 2022-12-14 ENCOUNTER — Non-Acute Institutional Stay (SKILLED_NURSING_FACILITY): Payer: Medicare Other | Admitting: Nurse Practitioner

## 2022-12-14 ENCOUNTER — Encounter: Payer: Self-pay | Admitting: Nurse Practitioner

## 2022-12-14 DIAGNOSIS — H10502 Unspecified blepharoconjunctivitis, left eye: Secondary | ICD-10-CM | POA: Diagnosis not present

## 2022-12-14 NOTE — Progress Notes (Signed)
Location:  Other [place].  Nursing Home Room Number: [facility] [address] Place of Service:  SNF [phone]) [name], NP  PCP: Earnestine Mealing, MD  Patient Care Team: Earnestine Mealing, MD as PCP - General [name])  Extended Emergency Contact Information Primary Emergency Contact: Nile Riggs States of [place] Mobile Phone: [phone] Relation: Daughter Secondary Emergency Contact: Tuff,fred Mobile Phone: [phone] Relation: Son  Goals of care: Advanced Directive information    12/14/2022    9:10 AM  Advanced Directives  Does Patient Have a Medical Advance Directive? Yes  Type of Estate agent of [place];Out of facility DNR (pink MOST or yellow form);Living will  Does patient want to make changes to medical advance directive? No - Patient declined  Copy of Healthcare Power of Attorney in Chart? Yes - validated most recent copy scanned in chart (See row information)     Chief Complaint  Patient presents with   Acute Visit    Red Left Eye    HPI:  Pt is a 89 y.o. female seen today for an acute visit for Red Left Eye Staff noted patients left eye lid has been red. The eye is normal. No visual changes.  Eye has green/yellow drainage.  Eye is itchy but not painful  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(HCC)    adenocarcinoma of transverse colon   Depression    Fracture, metacarpal 06/2016   5th metacarpal   History of chicken pox    Hypertension    Hypothyroidism    Macular degeneration    left eye   Thyroid disease    Past Surgical History:  Procedure Laterality Date   CARPAL TUNNEL RELEASE  12/09/2010   CHOLECYSTECTOMY  1995   COLON SURGERY  02/25/2010   transverse colon resection   COLONOSCOPY     ESOPHAGOGASTRODUODENOSCOPY  02/25/2010   GANGLION CYST EXCISION Right 07/26/2016   Procedure: REMOVAL GANGLION OF WRIST;  Surgeon: Donato Heinz, MD;  Location: [facility] ORS;  Service:  Orthopedics;  Laterality: Right;   GANGLION CYST EXCISION Right 11/10/2016   Procedure: REMOVAL GANGLION OF WRIST;  Surgeon: Donato Heinz, MD;  Location: [facility] ORS;  Service: Orthopedics;  Laterality: Right;   HERNIA REPAIR     INTRAMEDULLARY (IM) NAIL INTERTROCHANTERIC Left 09/30/2020   Procedure: INTRAMEDULLARY (IM) NAIL INTERTROCHANTRIC;  Surgeon: Signa Kell, MD;  Location: [facility] ORS;  Service: Orthopedics;  Laterality: Left;   MULTIPLE TOOTH EXTRACTIONS  06/2015   for dentures   UMBILICAL HERNIA REPAIR  02/25/2010    Allergies  Allergen Reactions   Celebrex [Celecoxib] Other (See Comments)    GI upset   Coconut (Cocos Nucifera)    Latex Itching    NEGATIVE LATEX IgE (< 0.10) 11/04/2016   Statins Other (See Comments)    Muscle pain   Strawberry (Diagnostic)    Adhesive [Tape] Rash    Outpatient Encounter Medications as of 12/14/2022  Medication Sig   acetaminophen (TYLENOL) 325 MG tablet Take 650 mg by mouth 3 (three) times daily.   alendronate (FOSAMAX) 70 MG tablet Take 1 tablet (70 mg total) by mouth once a week. Take with a full glass of water on an empty stomach.   calcium carbonate (TUMS - DOSED IN MG ELEMENTAL CALCIUM) 500 MG chewable tablet Chew 2 tablets (400 mg of elemental calcium total) by mouth 2 (two) times daily.   Carboxymethylcellulose Sod PF (REFRESH CELLUVISC) 1 % GEL Instill one drop in both eyes twice  daily   CHLORHEXIDINE GLUCONATE, BULK, SOLN 15 mLs by Does not apply route in the morning and at bedtime.   cholecalciferol (VITAMIN D) 1000 units tablet Take 1,000 Units by mouth daily.   dextromethorphan-guaiFENesin (TUSSIN DM) 10-100 MG/5ML liquid Take 10 mLs by mouth every 4 (four) hours as needed for cough.   diclofenac Sodium (VOLTAREN) 1 % GEL Apply 2gms to right shoulder topically every 12 hours as needed for pain.   donepezil (ARICEPT) 10 MG tablet Take 10 mg by mouth at bedtime.    Emollient (CETAPHIL) cream Apply topically as needed.   hydroxypropyl  methylcellulose / hypromellose (ISOPTO TEARS / GONIOVISC) 2.5 % ophthalmic solution Place 1 drop into both eyes 2 (two) times daily as needed for dry eyes.   lactase (LACTAID) 3000 units tablet Take 6,000 Units by mouth 3 (three) times daily with meals.   magnesium hydroxide (MILK OF MAGNESIA) 400 MG/5ML suspension Take 30 mLs by mouth daily as needed for mild constipation.   memantine (NAMENDA) 10 MG tablet Take 10 mg by mouth 2 (two) times daily.   Multiple Vitamin (THEREMS PO) Take 1 tablet by mouth daily.   NP THYROID 15 MG tablet Take 15 mg by mouth every morning.   NP THYROID 60 MG tablet Take 60 mg by mouth in the morning.   olopatadine (PATANOL) 0.1 % ophthalmic solution Place 1 drop into the left eye daily.   polyethylene glycol powder (GLYCOLAX/MIRALAX) 17 GM/SCOOP powder One scoop by mouth in the morning every Monday, Wednesday and Friday.   sertraline (ZOLOFT) 100 MG tablet Take 150 mg by mouth at bedtime.   [DISCONTINUED] nystatin (MYCOSTATIN/NYSTOP) powder Apply 1 Application topically every 12 (twelve) hours as needed.   No facility-administered encounter medications on file as of 12/14/2022.    Review of Systems  Eyes:  Positive for discharge and itching. Negative for pain, redness and visual disturbance.    Immunization History  Administered Date(s) Administered   Influenza-Unspecified 12/28/2011, 01/16/2013, 02/02/2014, 12/28/2014, 01/14/2015, 12/30/2015, 12/23/2016, 12/29/2021   Moderna Covid-19 Fall Seasonal Vaccine 51yrs & older 06/22/2022   Moderna Sars-Covid-2 Vaccination 03/26/2019, 04/23/2019, 01/25/2020, 08/01/2020, 12/10/2022   PPD Test 12/20/2016   Pneumococcal Conjugate (Pcv15) 09/01/2018   Pneumococcal-Unspecified 09/01/2018   Tdap 12/28/2013   Unspecified SARS-COV-2 Vaccination 12/05/2020, 08/11/2021   Zoster Recombinant(Shingrix) 10/30/2022   Pertinent  Health Maintenance Due  Topic Date Due   INFLUENZA VACCINE  10/14/2022   DEXA SCAN  Discontinued       04/04/2021    8:00 AM 04/04/2021    8:46 PM 04/05/2021    9:00 PM 04/06/2021   11:00 AM 10/21/2022   12:35 PM  Fall Risk  Falls in the past year?     0  (RETIRED) Patient Fall Risk Level High fall risk High fall risk High fall risk High fall risk   Patient at Risk for Falls Due to     Impaired balance/gait;Impaired mobility  Fall risk Follow up     Falls evaluation completed   Functional Status Survey:    Vitals:   12/14/22 0901  BP: 128/75  Pulse: 81  Resp: 18  Temp: (!) 97.3 F (36.3 C)  SpO2: 96%  Weight: 155 lb 6.4 oz (70.5 kg)  Height: 5\' 3"  (1.6 m)   Body mass index is 27.53 kg/m. Physical Exam Eyes:     Conjunctiva/sclera: Conjunctivae normal.     Left eye: Exudate present.     Pupils: Pupils are equal, round, and reactive to light.  Comments: Left lid red  Neurological:     Mental Status: She is alert.     Labs reviewed: Recent Labs    12/31/21 0000 08/26/22 0000  NA 131* 133*  K 4.4 4.4  CL 99 99  CO2 27* 27*  BUN 8 16  CREATININE 0.8 0.9  CALCIUM 8.3* 9.1   Recent Labs    12/31/21 0000 08/26/22 0000  AST 19 20  ALT 8 10  ALKPHOS 82 82  ALBUMIN 3.5 3.4*   Recent Labs    12/31/21 0000 07/30/22 0000  WBC 7.0 7.7  NEUTROABS  --  5,498.00  HGB 12.4 11.7*  HCT 37 35*  PLT 247 274   Lab Results  Component Value Date   TSH 3.60 08/26/2022   No results found for: "HGBA1C" No results found for: "CHOL", "HDL", "LDLCALC", "LDLDIRECT", "TRIG", "CHOLHDL"  Significant Diagnostic Results in last 30 days:  No results found.  Assessment/Plan 1. Blepharoconjunctivitis of left eye, unspecified blepharoconjunctivitis type To use warm compresses TID and wash eye with babyshampoo TID -to use erythromycin ointment 0.5inch ribbon to left eye QID for 5 days  Deantre Bourdon K. Biagio Borg [facility] [phone]

## 2022-12-16 ENCOUNTER — Encounter: Payer: Self-pay | Admitting: Nurse Practitioner

## 2022-12-16 ENCOUNTER — Non-Acute Institutional Stay (SKILLED_NURSING_FACILITY): Payer: Medicare Other | Admitting: Nurse Practitioner

## 2022-12-16 DIAGNOSIS — F03918 Unspecified dementia, unspecified severity, with other behavioral disturbance: Secondary | ICD-10-CM

## 2022-12-16 DIAGNOSIS — F330 Major depressive disorder, recurrent, mild: Secondary | ICD-10-CM | POA: Diagnosis not present

## 2022-12-16 DIAGNOSIS — M150 Primary generalized (osteo)arthritis: Secondary | ICD-10-CM | POA: Diagnosis not present

## 2022-12-16 DIAGNOSIS — H10502 Unspecified blepharoconjunctivitis, left eye: Secondary | ICD-10-CM

## 2022-12-16 DIAGNOSIS — K5904 Chronic idiopathic constipation: Secondary | ICD-10-CM

## 2022-12-16 DIAGNOSIS — E871 Hypo-osmolality and hyponatremia: Secondary | ICD-10-CM

## 2022-12-16 NOTE — Progress Notes (Signed)
Location:  Other [place].  Nursing Home Room Number: [facility] [address] Place of Service:  SNF [phone]) [name], NP  PCP: Earnestine Mealing, MD  Patient Care Team: Earnestine Mealing, MD as PCP - General [facility])  Extended Emergency Contact Information Primary Emergency Contact: Nile Riggs States of [place] Mobile Phone: [phone] Relation: Daughter Secondary Emergency Contact: Chimenti,fred Mobile Phone: [phone] Relation: Son  Goals of care: Advanced Directive information    12/16/2022   12:53 PM  Advanced Directives  Does Patient Have a Medical Advance Directive? Yes  Type of Estate agent of [place];Out of facility DNR (pink MOST or yellow form);Living will  Does patient want to make changes to medical advance directive? No - Patient declined  Copy of Healthcare Power of Attorney in Chart? Yes - validated most recent copy scanned in chart (See row information)     Chief Complaint  Patient presents with   Medical Management of Chronic Issues    Medical Management of Chronic Issues.     HPI:  Pt is a 89 y.o. female seen today for ongoing hallucinations and routine follow up.   She has a hx of dementia with paranoia and now with thoughts of bugs in her ears, face and throat.  Also thinking there is bugs in her food.   Daughter reports she wants whatever done for best quality of life.  Worried about wax build up in the ears.   No issues with constipation or diarrhea  Ongoing cough but denies shortness of breath, not productive.    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[facility])    adenocarcinoma of transverse colon   Depression    Fracture, metacarpal 06/2016   5th metacarpal   History of chicken pox    Hypertension    Hypothyroidism    Macular degeneration    left eye   Thyroid disease    Past Surgical History:  Procedure Laterality Date   CARPAL TUNNEL RELEASE  12/09/2010    CHOLECYSTECTOMY  1995   COLON SURGERY  02/25/2010   transverse colon resection   COLONOSCOPY     ESOPHAGOGASTRODUODENOSCOPY  02/25/2010   GANGLION CYST EXCISION Right 07/26/2016   Procedure: REMOVAL GANGLION OF WRIST;  Surgeon: Donato Heinz, MD;  Location: [facility] ORS;  Service: Orthopedics;  Laterality: Right;   GANGLION CYST EXCISION Right 11/10/2016   Procedure: REMOVAL GANGLION OF WRIST;  Surgeon: Donato Heinz, MD;  Location: [facility] ORS;  Service: Orthopedics;  Laterality: Right;   HERNIA REPAIR     INTRAMEDULLARY (IM) NAIL INTERTROCHANTERIC Left 09/30/2020   Procedure: INTRAMEDULLARY (IM) NAIL INTERTROCHANTRIC;  Surgeon: Signa Kell, MD;  Location: [facility] ORS;  Service: Orthopedics;  Laterality: Left;   MULTIPLE TOOTH EXTRACTIONS  06/2015   for dentures   UMBILICAL HERNIA REPAIR  02/25/2010    Allergies  Allergen Reactions   Celebrex [Celecoxib] Other (See Comments)    GI upset   Coconut (Cocos Nucifera)    Latex Itching    NEGATIVE LATEX IgE (< 0.10) 11/04/2016   Statins Other (See Comments)    Muscle pain   Strawberry (Diagnostic)    Adhesive [Tape] Rash    Outpatient Encounter Medications as of 12/16/2022  Medication Sig   acetaminophen (TYLENOL) 325 MG tablet Take 650 mg by mouth 3 (three) times daily.   alendronate (FOSAMAX) 70 MG tablet Take 1 tablet (70 mg total) by mouth once a week. Take with a full glass of water on an  empty stomach.   calcium carbonate (TUMS - DOSED IN MG ELEMENTAL CALCIUM) 500 MG chewable tablet Chew 2 tablets (400 mg of elemental calcium total) by mouth 2 (two) times daily.   Carboxymethylcellulose Sod PF (REFRESH CELLUVISC) 1 % GEL Instill one drop in both eyes twice daily   CHLORHEXIDINE GLUCONATE, BULK, SOLN 15 mLs by Does not apply route in the morning and at bedtime.   cholecalciferol (VITAMIN D) 1000 units tablet Take 1,000 Units by mouth daily.   dextromethorphan-guaiFENesin (TUSSIN DM) 10-100 MG/5ML liquid Take 10 mLs by mouth every 4 (four)  hours as needed for cough.   diclofenac Sodium (VOLTAREN) 1 % GEL Apply 2gms to right shoulder topically every 12 hours as needed for pain.   donepezil (ARICEPT) 10 MG tablet Take 10 mg by mouth at bedtime.    Emollient (CETAPHIL) cream Apply topically as needed.   ERYTHROMYCIN OP Instill 0.5inch of ointment in left eye four times daily   hydroxypropyl methylcellulose / hypromellose (ISOPTO TEARS / GONIOVISC) 2.5 % ophthalmic solution Place 1 drop into both eyes 2 (two) times daily as needed for dry eyes.   lactase (LACTAID) 3000 units tablet Take 6,000 Units by mouth 3 (three) times daily with meals.   magnesium hydroxide (MILK OF MAGNESIA) 400 MG/5ML suspension Take 30 mLs by mouth daily as needed for mild constipation.   memantine (NAMENDA) 10 MG tablet Take 10 mg by mouth 2 (two) times daily.   Multiple Vitamin (THEREMS PO) Take 1 tablet by mouth daily.   NP THYROID 15 MG tablet Take 15 mg by mouth every morning.   NP THYROID 60 MG tablet Take 60 mg by mouth in the morning.   olopatadine (PATANOL) 0.1 % ophthalmic solution Place 1 drop into the left eye daily.   polyethylene glycol powder (GLYCOLAX/MIRALAX) 17 GM/SCOOP powder One scoop by mouth in the morning every Monday, Wednesday and Friday.   sertraline (ZOLOFT) 100 MG tablet Take 150 mg by mouth at bedtime.   No facility-administered encounter medications on file as of 12/16/2022.    Review of Systems  Unable to perform ROS: Dementia     Immunization History  Administered Date(s) Administered   Influenza-Unspecified 12/28/2011, 01/16/2013, 02/02/2014, 12/28/2014, 01/14/2015, 12/30/2015, 12/23/2016, 12/29/2021   Moderna Covid-19 Fall Seasonal Vaccine 3yrs & older 06/22/2022   Moderna Sars-Covid-2 Vaccination 03/26/2019, 04/23/2019, 01/25/2020, 08/01/2020, 12/10/2022   PPD Test 12/20/2016   Pneumococcal Conjugate (Pcv15) 09/01/2018   Pneumococcal-Unspecified 09/01/2018   Tdap 12/28/2013   Unspecified SARS-COV-2 Vaccination  12/05/2020, 08/11/2021   Zoster Recombinant(Shingrix) 10/30/2022   Pertinent  Health Maintenance Due  Topic Date Due   INFLUENZA VACCINE  10/14/2022   DEXA SCAN  Discontinued      04/04/2021    8:00 AM 04/04/2021    8:46 PM 04/05/2021    9:00 PM 04/06/2021   11:00 AM 10/21/2022   12:35 PM  Fall Risk  Falls in the past year?     0  (RETIRED) Patient Fall Risk Level High fall risk High fall risk High fall risk High fall risk   Patient at Risk for Falls Due to     Impaired balance/gait;Impaired mobility  Fall risk Follow up     Falls evaluation completed   Functional Status Survey:    Vitals:   12/16/22 1244  BP: 128/75  Pulse: 81  Resp: 18  Temp: (!) 97.3 F (36.3 C)  SpO2: 96%  Weight: 151 lb 9.6 oz (68.8 kg)  Height: 5\' 3"  (1.6 m)  Body mass index is 26.85 kg/m. Physical Exam Constitutional:      General: She is not in acute distress.    Appearance: She is well-developed. She is not diaphoretic.  HENT:     Head: Normocephalic and atraumatic.     Mouth/Throat:     Pharynx: No oropharyngeal exudate.  Eyes:     Conjunctiva/sclera: Conjunctivae normal.     Pupils: Pupils are equal, round, and reactive to light.  Cardiovascular:     Rate and Rhythm: Normal rate and regular rhythm.     Heart sounds: Normal heart sounds.  Pulmonary:     Effort: Pulmonary effort is normal.     Breath sounds: Normal breath sounds.  Abdominal:     General: Bowel sounds are normal.     Palpations: Abdomen is soft.  Musculoskeletal:     Cervical back: Normal range of motion and neck supple.     Right lower leg: No edema.     Left lower leg: No edema.  Skin:    General: Skin is warm and dry.     Comments: Excoriation from scratching noted on face. No signs of infection  Neurological:     Mental Status: She is alert. Mental status is at baseline. She is disoriented.     Motor: Weakness present.     Gait: Gait abnormal.  Psychiatric:        Mood and Affect: Mood normal.     Labs  reviewed: Recent Labs    12/31/21 0000 08/26/22 0000  NA 131* 133*  K 4.4 4.4  CL 99 99  CO2 27* 27*  BUN 8 16  CREATININE 0.8 0.9  CALCIUM 8.3* 9.1   Recent Labs    12/31/21 0000 08/26/22 0000  AST 19 20  ALT 8 10  ALKPHOS 82 82  ALBUMIN 3.5 3.4*   Recent Labs    12/31/21 0000 07/30/22 0000  WBC 7.0 7.7  NEUTROABS  --  5,498.00  HGB 12.4 11.7*  HCT 37 35*  PLT 247 274   Lab Results  Component Value Date   TSH 3.60 08/26/2022   No results found for: "HGBA1C" No results found for: "CHOL", "HDL", "LDLCALC", "LDLDIRECT", "TRIG", "CHOLHDL"  Significant Diagnostic Results in last 30 days:  No results found.  Assessment/Plan 1. Chronic idiopathic constipation Well controlled at this time  2. Primary osteoarthritis involving multiple joints No signs of worsening pain  3. Dementia with behavioral disturbance (HCC) With increase in hallucinations and hx of paranoia Will add seroquel 12.5 mg bid to help with behaviors.    4. Mild episode of recurrent major depressive disorder (HCC) -stable, continues zoloft  5. Hyponatremia Follow lab.   6. Blepharoconjunctivitis of left eye, unspecified blepharoconjunctivitis type Has completed 2  days of treatment, continue at this time.   Janene Harvey. Biagio Borg [facility] [phone]

## 2023-01-21 ENCOUNTER — Encounter: Payer: Self-pay | Admitting: Adult Health

## 2023-01-21 ENCOUNTER — Non-Acute Institutional Stay (SKILLED_NURSING_FACILITY): Payer: Medicare Other | Admitting: Adult Health

## 2023-01-21 DIAGNOSIS — F03918 Unspecified dementia, unspecified severity, with other behavioral disturbance: Secondary | ICD-10-CM

## 2023-01-21 DIAGNOSIS — E039 Hypothyroidism, unspecified: Secondary | ICD-10-CM | POA: Diagnosis not present

## 2023-01-21 DIAGNOSIS — J189 Pneumonia, unspecified organism: Secondary | ICD-10-CM

## 2023-01-21 NOTE — Progress Notes (Unsigned)
Location:  Other [facility]) Nursing Home Room Number: [place] [address] [facility]) Place of Service:  SNF (31) Provider:  Kenard Gower, DNP, FNP-BC  Patient Care Team: Earnestine Mealing, MD as PCP - General (Family Medicine)  Extended Emergency Contact Information Primary Emergency Contact: Nile Riggs States of [place] Mobile Phone: [phone] Relation: Daughter Secondary Emergency Contact: Drouillard,fred Mobile Phone: [phone] Relation: Son  Code Status:  DNR  Goals of care: Advanced Directive information    01/21/2023   11:21 AM  Advanced Directives  Does Patient Have a Medical Advance Directive? Yes  Type of Estate agent of [place];Out of facility DNR (pink MOST or yellow form);Living will  Does patient want to make changes to medical advance directive? No - Patient declined  Copy of Healthcare Power of Attorney in Chart? Yes - validated most recent copy scanned in chart (See row information)  Pre-existing out of facility DNR order (yellow form or pink MOST form) Yellow form placed in chart (order not valid for inpatient use)     Chief Complaint  Patient presents with   Acute Visit    cough    HPI:  Pt is a 89 y.o. female seen today for medical management of chronic diseases.  ***   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[facility])    adenocarcinoma of transverse colon   Depression    Fracture, metacarpal 06/2016   5th metacarpal   History of chicken pox    Hypertension    Hypothyroidism    Macular degeneration    left eye   Thyroid disease    Past Surgical History:  Procedure Laterality Date   CARPAL TUNNEL RELEASE  12/09/2010   CHOLECYSTECTOMY  1995   COLON SURGERY  02/25/2010   transverse colon resection   COLONOSCOPY     ESOPHAGOGASTRODUODENOSCOPY  02/25/2010   GANGLION CYST EXCISION Right 07/26/2016   Procedure: REMOVAL GANGLION OF WRIST;  Surgeon: Donato Heinz, MD;  Location: [facility]  ORS;  Service: Orthopedics;  Laterality: Right;   GANGLION CYST EXCISION Right 11/10/2016   Procedure: REMOVAL GANGLION OF WRIST;  Surgeon: Donato Heinz, MD;  Location: [facility] ORS;  Service: Orthopedics;  Laterality: Right;   HERNIA REPAIR     INTRAMEDULLARY (IM) NAIL INTERTROCHANTERIC Left 09/30/2020   Procedure: INTRAMEDULLARY (IM) NAIL INTERTROCHANTRIC;  Surgeon: Signa Kell, MD;  Location: [facility] ORS;  Service: Orthopedics;  Laterality: Left;   MULTIPLE TOOTH EXTRACTIONS  06/2015   for dentures   UMBILICAL HERNIA REPAIR  02/25/2010    Allergies  Allergen Reactions   Celebrex [Celecoxib] Other (See Comments)    GI upset   Coconut (Cocos Nucifera)    Latex Itching    NEGATIVE LATEX IgE (< 0.10) 11/04/2016   Statins Other (See Comments)    Muscle pain   Strawberry (Diagnostic)    Adhesive [Tape] Rash    Outpatient Encounter Medications as of 01/21/2023  Medication Sig   acetaminophen (TYLENOL) 325 MG tablet Take 650 mg by mouth 3 (three) times daily.   alendronate (FOSAMAX) 70 MG tablet Take 1 tablet (70 mg total) by mouth once a week. Take with a full glass of water on an empty stomach.   calcium carbonate (TUMS - DOSED IN MG ELEMENTAL CALCIUM) 500 MG chewable tablet Chew 2 tablets (400 mg of elemental calcium total) by mouth 2 (two) times daily.   Carboxymethylcellulose Sod PF (REFRESH CELLUVISC) 1 % GEL Instill one drop in both eyes twice daily  CHLORHEXIDINE GLUCONATE, BULK, SOLN 15 mLs by Does not apply route in the morning and at bedtime.   cholecalciferol (VITAMIN D) 1000 units tablet Take 1,000 Units by mouth daily.   dextromethorphan-guaiFENesin (TUSSIN DM) 10-100 MG/5ML liquid Take 10 mLs by mouth every 4 (four) hours as needed for cough.   diclofenac Sodium (VOLTAREN) 1 % GEL Apply 2gms to right shoulder topically every 12 hours as needed for pain.   donepezil (ARICEPT) 10 MG tablet Take 10 mg by mouth at bedtime.    Emollient (CETAPHIL) cream Apply topically as needed.    hydroxypropyl methylcellulose / hypromellose (ISOPTO TEARS / GONIOVISC) 2.5 % ophthalmic solution Place 1 drop into both eyes 2 (two) times daily as needed for dry eyes.   lactase (LACTAID) 3000 units tablet Take 6,000 Units by mouth 3 (three) times daily with meals.   magnesium hydroxide (MILK OF MAGNESIA) 400 MG/5ML suspension Take 30 mLs by mouth daily as needed for mild constipation.   memantine (NAMENDA) 10 MG tablet Take 10 mg by mouth 2 (two) times daily.   Multiple Vitamin (THEREMS PO) Take 1 tablet by mouth daily.   NP THYROID 15 MG tablet Take 15 mg by mouth every morning.   NP THYROID 60 MG tablet Take 60 mg by mouth in the morning.   olopatadine (PATANOL) 0.1 % ophthalmic solution Place 1 drop into the left eye daily.   polyethylene glycol powder (GLYCOLAX/MIRALAX) 17 GM/SCOOP powder One scoop by mouth in the morning every Monday, Wednesday and Friday.   sertraline (ZOLOFT) 100 MG tablet Take 150 mg by mouth at bedtime.   ERYTHROMYCIN OP Instill 0.5inch of ointment in left eye four times daily (Patient not taking: Reported on 01/21/2023)   No facility-administered encounter medications on file as of 01/21/2023.    Review of Systems ***    Immunization History  Administered Date(s) Administered   Fluad Quad(high Dose 65+) 01/05/2023   Influenza-Unspecified 12/28/2011, 01/16/2013, 02/02/2014, 12/28/2014, 01/14/2015, 12/30/2015, 12/23/2016, 12/29/2021   Moderna Covid-19 Fall Seasonal Vaccine 10yrs & older 06/22/2022   Moderna Sars-Covid-2 Vaccination 03/26/2019, 04/23/2019, 01/25/2020, 08/01/2020, 12/10/2022   PPD Test 12/20/2016   Pneumococcal Conjugate (Pcv15) 09/01/2018   Pneumococcal-Unspecified 09/01/2018   Tdap 12/28/2013   Unspecified SARS-COV-2 Vaccination 12/05/2020, 08/11/2021   Zoster Recombinant(Shingrix) 10/30/2022   Pertinent  Health Maintenance Due  Topic Date Due   INFLUENZA VACCINE  Completed   DEXA SCAN  Discontinued      04/04/2021    8:00 AM 04/04/2021     8:46 PM 04/05/2021    9:00 PM 04/06/2021   11:00 AM 10/21/2022   12:35 PM  Fall Risk  Falls in the past year?     0  (RETIRED) Patient Fall Risk Level High fall risk High fall risk High fall risk High fall risk   Patient at Risk for Falls Due to     Impaired balance/gait;Impaired mobility  Fall risk Follow up     Falls evaluation completed     Vitals:   01/21/23 1042  BP: (!) 155/82  Pulse: 72  Resp: 17  Temp: (!) 97.3 F (36.3 C)  SpO2: 95%  Weight: 155 lb 3.2 oz (70.4 kg)  Height: 5\' 3"  (1.6 m)   Body mass index is 27.49 kg/m.  Physical Exam     Labs reviewed: Recent Labs    08/26/22 0000  NA 133*  K 4.4  CL 99  CO2 27*  BUN 16  CREATININE 0.9  CALCIUM 9.1   Recent Labs  08/26/22 0000  AST 20  ALT 10  ALKPHOS 82  ALBUMIN 3.4*   Recent Labs    07/30/22 0000  WBC 7.7  NEUTROABS 5,498.00  HGB 11.7*  HCT 35*  PLT 274   Lab Results  Component Value Date   TSH 3.60 08/26/2022   No results found for: "HGBA1C" No results found for: "CHOL", "HDL", "LDLCALC", "LDLDIRECT", "TRIG", "CHOLHDL"  Significant Diagnostic Results in last 30 days:  No results found.  Assessment/Plan ***   Family/ staff Communication: Discussed plan of care with resident and charge nurse  Labs/tests ordered:     Kenard Gower, DNP, MSN, FNP-BC [facility] [phone] (Monday-Friday 8:00 a.m. - 5:00 p.m.) [phone] (after hours)

## 2023-02-20 ENCOUNTER — Encounter: Payer: Self-pay | Admitting: Student

## 2023-02-20 NOTE — Progress Notes (Signed)
Location:  Other Nursing Home Room Number: [facility] Place of Service:  SNF [phone]) Provider:  Ander Gaster, Benetta Spar, MD  Patient Care Team: Earnestine Mealing, MD as PCP - General (Family Medicine)  Extended Emergency Contact Information Primary Emergency Contact: Nile Riggs [place] of [place] Mobile Phone: [phone] Relation: Daughter Secondary Emergency Contact: Auth,fred Mobile Phone: [phone] Relation: Son  Code Status:  DNR Goals of care: Advanced Directive information    01/21/2023   11:21 AM  Advanced Directives  Does Patient Have a Medical Advance Directive? Yes  Type of Estate agent of [place];Out of facility DNR (pink MOST or yellow form);Living will  Does patient want to make changes to medical advance directive? No - Patient declined  Copy of Healthcare Power of Attorney in Chart? Yes - validated most recent copy scanned in chart (See row information)  Pre-existing out of facility DNR order (yellow form or pink MOST form) Yellow form placed in chart (order not valid for inpatient use)     Chief Complaint  Patient presents with   Medical Management of Chronic Conditions    HPI:  Pt is a 89 y.o. female seen today for medical management of chronic diseases.   Patient says she is fine. She says we are in [place] which is where she is from . Unsure of the year or the year she was born.  Nursing without acute concerns. She requires total support and some encouragement with eating. Significant aspiration with each food and drink item.   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[facility])    adenocarcinoma of transverse colon   Depression    Fracture, metacarpal 06/2016   5th metacarpal   History of chicken pox    Hypertension    Hypothyroidism    Macular degeneration    left eye   Thyroid disease    Past Surgical History:  Procedure Laterality Date   CARPAL TUNNEL RELEASE  12/09/2010    CHOLECYSTECTOMY  1995   COLON SURGERY  02/25/2010   transverse colon resection   COLONOSCOPY     ESOPHAGOGASTRODUODENOSCOPY  02/25/2010   GANGLION CYST EXCISION Right 07/26/2016   Procedure: REMOVAL GANGLION OF WRIST;  Surgeon: Donato Heinz, MD;  Location: [facility] ORS;  Service: Orthopedics;  Laterality: Right;   GANGLION CYST EXCISION Right 11/10/2016   Procedure: REMOVAL GANGLION OF WRIST;  Surgeon: Donato Heinz, MD;  Location: [facility] ORS;  Service: Orthopedics;  Laterality: Right;   HERNIA REPAIR     INTRAMEDULLARY (IM) NAIL INTERTROCHANTERIC Left 09/30/2020   Procedure: INTRAMEDULLARY (IM) NAIL INTERTROCHANTRIC;  Surgeon: Signa Kell, MD;  Location: [facility] ORS;  Service: Orthopedics;  Laterality: Left;   MULTIPLE TOOTH EXTRACTIONS  06/2015   for dentures   UMBILICAL HERNIA REPAIR  02/25/2010    Allergies  Allergen Reactions   Celebrex [Celecoxib] Other (See Comments)    GI upset   Coconut (Cocos Nucifera)    Latex Itching    NEGATIVE LATEX IgE (< 0.10) 11/04/2016   Statins Other (See Comments)    Muscle pain   Strawberry (Diagnostic)    Adhesive [Tape] Rash    Outpatient Encounter Medications as of 02/21/2023  Medication Sig   acetaminophen (TYLENOL) 325 MG tablet Take 650 mg by mouth 3 (three) times daily.   alendronate (FOSAMAX) 70 MG tablet Take 1 tablet (70 mg total) by mouth once a week. Take with a full glass of water on an empty stomach.   calcium carbonate (TUMS -  DOSED IN MG ELEMENTAL CALCIUM) 500 MG chewable tablet Chew 2 tablets (400 mg of elemental calcium total) by mouth 2 (two) times daily.   Carboxymethylcellulose Sod PF (REFRESH CELLUVISC) 1 % GEL Instill one drop in both eyes twice daily   CHLORHEXIDINE GLUCONATE, BULK, SOLN 15 mLs by Does not apply route in the morning and at bedtime.   cholecalciferol (VITAMIN D) 1000 units tablet Take 1,000 Units by mouth daily.   dextromethorphan-guaiFENesin (TUSSIN DM) 10-100 MG/5ML liquid Take 10 mLs by mouth every 4 (four)  hours as needed for cough.   diclofenac Sodium (VOLTAREN) 1 % GEL Apply 2gms to right shoulder topically every 12 hours as needed for pain.   donepezil (ARICEPT) 10 MG tablet Take 10 mg by mouth at bedtime.    Emollient (CETAPHIL) cream Apply topically as needed.   ERYTHROMYCIN OP Instill 0.5inch of ointment in left eye four times daily (Patient not taking: Reported on 01/21/2023)   hydroxypropyl methylcellulose / hypromellose (ISOPTO TEARS / GONIOVISC) 2.5 % ophthalmic solution Place 1 drop into both eyes 2 (two) times daily as needed for dry eyes.   lactase (LACTAID) 3000 units tablet Take 6,000 Units by mouth 3 (three) times daily with meals.   magnesium hydroxide (MILK OF MAGNESIA) 400 MG/5ML suspension Take 30 mLs by mouth daily as needed for mild constipation.   memantine (NAMENDA) 10 MG tablet Take 10 mg by mouth 2 (two) times daily.   Multiple Vitamin (THEREMS PO) Take 1 tablet by mouth daily.   NP THYROID 15 MG tablet Take 15 mg by mouth every morning.   NP THYROID 60 MG tablet Take 60 mg by mouth in the morning.   olopatadine (PATANOL) 0.1 % ophthalmic solution Place 1 drop into the left eye daily.   polyethylene glycol powder (GLYCOLAX/MIRALAX) 17 GM/SCOOP powder One scoop by mouth in the morning every Monday, Wednesday and Friday.   sertraline (ZOLOFT) 100 MG tablet Take 150 mg by mouth at bedtime.   No facility-administered encounter medications on file as of 02/21/2023.    Review of Systems  Immunization History  Administered Date(s) Administered   Fluad Quad(high Dose 65+) 01/05/2023   Influenza-Unspecified 12/28/2011, 01/16/2013, 02/02/2014, 12/28/2014, 01/14/2015, 12/30/2015, 12/23/2016, 12/29/2021   Moderna Covid-19 Fall Seasonal Vaccine 52yrs & older 06/22/2022   Moderna Sars-Covid-2 Vaccination 03/26/2019, 04/23/2019, 01/25/2020, 08/01/2020, 12/10/2022   PPD Test 12/20/2016   Pneumococcal Conjugate (Pcv15) 09/01/2018   Pneumococcal-Unspecified 09/01/2018   Tdap  12/28/2013   Unspecified SARS-COV-2 Vaccination 12/05/2020, 08/11/2021   Zoster Recombinant(Shingrix) 10/30/2022   Pertinent  Health Maintenance Due  Topic Date Due   INFLUENZA VACCINE  Completed   DEXA SCAN  Discontinued      04/04/2021    8:00 AM 04/04/2021    8:46 PM 04/05/2021    9:00 PM 04/06/2021   11:00 AM 10/21/2022   12:35 PM  Fall Risk  Falls in the past year?     0  (RETIRED) Patient Fall Risk Level High fall risk High fall risk High fall risk High fall risk   Patient at Risk for Falls Due to     Impaired balance/gait;Impaired mobility  Fall risk Follow up     Falls evaluation completed   Functional Status Survey:    Vitals:   02/20/23 1658  BP: 134/72  Pulse: 70  Resp: 18  Temp: (!) 97.5 F (36.4 C)  SpO2: (!) 35%  Weight: 156 lb 6.4 oz (70.9 kg)   Body mass index is 27.71 kg/m. Physical Exam  Cardiovascular:     Rate and Rhythm: Normal rate.     Pulses: Normal pulses.  Pulmonary:     Effort: Pulmonary effort is normal.  Abdominal:     General: Abdomen is flat.     Palpations: Abdomen is soft.  Neurological:     Mental Status: She is alert. Mental status is at baseline. She is disoriented.     Labs reviewed: Recent Labs    08/26/22 0000  NA 133*  K 4.4  CL 99  CO2 27*  BUN 16  CREATININE 0.9  CALCIUM 9.1   Recent Labs    08/26/22 0000  AST 20  ALT 10  ALKPHOS 82  ALBUMIN 3.4*   Recent Labs    07/30/22 0000  WBC 7.7  NEUTROABS 5,498.00  HGB 11.7*  HCT 35*  PLT 274   Lab Results  Component Value Date   TSH 3.60 08/26/2022   No results found for: "HGBA1C" No results found for: "CHOL", "HDL", "LDLCALC", "LDLDIRECT", "TRIG", "CHOLHDL"  Significant Diagnostic Results in last 30 days:  No results found.  Assessment/Plan Dementia with behavioral disturbance (HCC)  Mild episode of recurrent major depressive disorder (HCC)  Chronic idiopathic constipation  Iron deficiency anemia, unspecified iron deficiency anemia  type  Essential hypertension  Aspiration into airway, sequela Patient with history of dementia. No acute signs of decline at this time. Mood stable. Having normal bowel movements with current regimen. BP well-controlled, continue current regimen. Continued aspiration with high risk for aspiration pneumonia. Continue GOC conversations with patient's family given advanced age and aspiration, she remains at high risk for hospitalizations.   Family/ staff Communication: nursing  Labs/tests ordered:  BMP and CBC

## 2023-02-21 ENCOUNTER — Non-Acute Institutional Stay (SKILLED_NURSING_FACILITY): Payer: Medicare Other | Admitting: Student

## 2023-02-21 DIAGNOSIS — F03918 Unspecified dementia, unspecified severity, with other behavioral disturbance: Secondary | ICD-10-CM | POA: Diagnosis not present

## 2023-02-21 DIAGNOSIS — K5904 Chronic idiopathic constipation: Secondary | ICD-10-CM | POA: Diagnosis not present

## 2023-02-21 DIAGNOSIS — F330 Major depressive disorder, recurrent, mild: Secondary | ICD-10-CM

## 2023-02-21 DIAGNOSIS — T17908S Unspecified foreign body in respiratory tract, part unspecified causing other injury, sequela: Secondary | ICD-10-CM

## 2023-02-21 DIAGNOSIS — D509 Iron deficiency anemia, unspecified: Secondary | ICD-10-CM | POA: Diagnosis not present

## 2023-02-21 DIAGNOSIS — I10 Essential (primary) hypertension: Secondary | ICD-10-CM

## 2023-02-27 ENCOUNTER — Encounter: Payer: Self-pay | Admitting: Student

## 2023-04-21 ENCOUNTER — Non-Acute Institutional Stay (SKILLED_NURSING_FACILITY): Payer: Medicare Other | Admitting: Nurse Practitioner

## 2023-04-21 ENCOUNTER — Encounter: Payer: Self-pay | Admitting: Nurse Practitioner

## 2023-04-21 DIAGNOSIS — F330 Major depressive disorder, recurrent, mild: Secondary | ICD-10-CM

## 2023-04-21 DIAGNOSIS — D509 Iron deficiency anemia, unspecified: Secondary | ICD-10-CM

## 2023-04-21 DIAGNOSIS — E739 Lactose intolerance, unspecified: Secondary | ICD-10-CM | POA: Diagnosis not present

## 2023-04-21 DIAGNOSIS — I10 Essential (primary) hypertension: Secondary | ICD-10-CM

## 2023-04-21 DIAGNOSIS — F03918 Unspecified dementia, unspecified severity, with other behavioral disturbance: Secondary | ICD-10-CM

## 2023-04-21 DIAGNOSIS — E039 Hypothyroidism, unspecified: Secondary | ICD-10-CM

## 2023-04-21 DIAGNOSIS — M150 Primary generalized (osteo)arthritis: Secondary | ICD-10-CM

## 2023-04-21 NOTE — Progress Notes (Signed)
 Location:  Other [place].  Nursing Home Room Number: [facility] [address] Place of Service:  SNF [phone]) Harlene An, NP  PCP: Abdul Fine, MD  Patient Care Team: Abdul Fine, MD as PCP - General [facility])  Extended Emergency Contact Information Primary Emergency Contact: Shobert,Robin  [place] Mobile Phone: [phone] Relation: Daughter Secondary Emergency Contact: Kohl,fred Mobile Phone: [phone] Relation: Son  Goals of care: Advanced Directive information    04/21/2023    9:36 AM  Advanced Directives  Does Patient Have a Medical Advance Directive? Yes  Type of Estate Agent of [place];Out of facility DNR (pink MOST or yellow form);Living will  Does patient want to make changes to medical advance directive? No - Patient declined  Copy of Healthcare Power of Attorney in Chart? Yes - validated most recent copy scanned in chart (See row information)     Chief Complaint  Patient presents with   Medical Management of Chronic Issues    Medical Management of Chronic Issues. Increased Paranoia and Cold Sore on Lip    HPI:  Pt is a 89 y.o. female seen today for medical management of chronic disease.   She experiences an increase in paranoia, characterized by accusations of food poisoning and an obsession with another resident, believing she is not who she claims to be. Delusions are present, hallucinations for seeing bugs, which has decreased since starting Seroquel 12.5 mg. Initially, she experienced increased sleepiness, but this has resolved. Now with increase in paranoia and hallucinations of bugs   Her daughter is involved in her care decisions, particularly regarding her Zoloft  dosage.  Her appetite is poor, but she has not experienced significant weight loss. She consumes nutritional supplements regularly.  She experiences chronic arthritic shoulder pain, which is managed with routine Tylenol  use. X-rays  have been performed in the past to evaluate this condition showing OA.   She has a history of iron deficiency anemia, with a hemoglobin level of 11.7 in May. Her thyroid  function was checked in June and was normal. She takes MP thyroid  daily and Zoloft  150 mg at bedtime for mood.  She has osteoporosis and continues treatment with Fosamax  and vitamin D .  She also has lactose intolerance, managed with lactaid tablets taken three times daily with meals.  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[facility])    adenocarcinoma of transverse colon   Depression    Fracture, metacarpal 06/2016   5th metacarpal   History of chicken pox    Hypertension    Hypothyroidism    Macular degeneration    left eye   Thyroid  disease    Past Surgical History:  Procedure Laterality Date   CARPAL TUNNEL RELEASE  12/09/2010   CHOLECYSTECTOMY  1995   COLON SURGERY  02/25/2010   transverse colon resection   COLONOSCOPY     ESOPHAGOGASTRODUODENOSCOPY  02/25/2010   GANGLION CYST EXCISION Right 07/26/2016   Procedure: REMOVAL GANGLION OF WRIST;  Surgeon: Mardee Lynwood SQUIBB, MD;  Location: [facility] ORS;  Service: Orthopedics;  Laterality: Right;   GANGLION CYST EXCISION Right 11/10/2016   Procedure: REMOVAL GANGLION OF WRIST;  Surgeon: Mardee Lynwood SQUIBB, MD;  Location: [facility] ORS;  Service: Orthopedics;  Laterality: Right;   HERNIA REPAIR     INTRAMEDULLARY (IM) NAIL INTERTROCHANTERIC Left 09/30/2020   Procedure: INTRAMEDULLARY (IM) NAIL INTERTROCHANTRIC;  Surgeon: Tobie Priest, MD;  Location: [facility] ORS;  Service: Orthopedics;  Laterality: Left;   MULTIPLE TOOTH EXTRACTIONS  06/2015  for dentures   UMBILICAL HERNIA REPAIR  02/25/2010    Allergies  Allergen Reactions   Celebrex [Celecoxib] Other (See Comments)    GI upset   Coconut (Cocos Nucifera)    Latex Itching    NEGATIVE LATEX IgE (< 0.10) 11/04/2016   Statins Other (See Comments)    Muscle pain   Strawberry (Diagnostic)    Adhesive [Tape] Rash     Outpatient Encounter Medications as of 04/21/2023  Medication Sig   acetaminophen  (TYLENOL ) 325 MG tablet Take 650 mg by mouth 3 (three) times daily.   calcium  carbonate (TUMS - DOSED IN MG ELEMENTAL CALCIUM ) 500 MG chewable tablet Chew 2 tablets (400 mg of elemental calcium  total) by mouth 2 (two) times daily.   Carboxymethylcellulose Sod PF (REFRESH CELLUVISC) 1 % GEL Instill one drop in both eyes twice daily   CHLORHEXIDINE  GLUCONATE, BULK, SOLN 15 mLs by Does not apply route in the morning and at bedtime.   cholecalciferol  (VITAMIN D ) 1000 units tablet Take 1,000 Units by mouth daily.   dextromethorphan-guaiFENesin (TUSSIN DM) 10-100 MG/5ML liquid Take 10 mLs by mouth every 4 (four) hours as needed for cough.   diclofenac Sodium (VOLTAREN) 1 % GEL Apply 2gms to right shoulder topically every 12 hours as needed for pain.   donepezil  (ARICEPT ) 10 MG tablet Take 10 mg by mouth at bedtime.    Emollient (CETAPHIL) cream Apply topically as needed.   hydroxypropyl methylcellulose / hypromellose (ISOPTO TEARS / GONIOVISC) 2.5 % ophthalmic solution Place 1 drop into both eyes 2 (two) times daily as needed for dry eyes.   lactase (LACTAID) 3000 units tablet Take 6,000 Units by mouth 3 (three) times daily with meals.   magnesium  hydroxide (MILK OF MAGNESIA) 400 MG/5ML suspension Take 30 mLs by mouth daily as needed for mild constipation.   memantine  (NAMENDA ) 10 MG tablet Take 10 mg by mouth 2 (two) times daily.   Multiple Vitamin (THEREMS PO) Take 1 tablet by mouth daily.   NP THYROID  15 MG tablet Take 15 mg by mouth every morning.   NP THYROID  60 MG tablet Take 60 mg by mouth in the morning.   olopatadine  (PATANOL) 0.1 % ophthalmic solution Place 1 drop into the left eye daily.   QUEtiapine (SEROQUEL) 25 MG tablet Take 12.5 mg by mouth 2 (two) times daily.   sertraline  (ZOLOFT ) 100 MG tablet Take 150 mg by mouth at bedtime.   alendronate  (FOSAMAX ) 70 MG tablet Take 1 tablet (70 mg total) by mouth  once a week. Take with a full glass of water on an empty stomach. (Patient not taking: Reported on 04/21/2023)   polyethylene glycol powder (GLYCOLAX /MIRALAX ) 17 GM/SCOOP powder One scoop by mouth in the morning every Monday, Wednesday and Friday. (Patient not taking: Reported on 04/21/2023)   [DISCONTINUED] ERYTHROMYCIN OP Instill 0.5inch of ointment in left eye four times daily (Patient not taking: Reported on 01/21/2023)   No facility-administered encounter medications on file as of 04/21/2023.    Review of Systems  Unable to perform ROS: Dementia     Immunization History  Administered Date(s) Administered   Fluad Quad(high Dose 65+) 01/05/2023   Influenza-Unspecified 12/28/2011, 01/16/2013, 02/02/2014, 12/28/2014, 01/14/2015, 12/30/2015, 12/23/2016, 12/29/2021   Moderna Covid-19 Fall Seasonal Vaccine 52yrs & older 06/22/2022   Moderna Sars-Covid-2 Vaccination 03/26/2019, 04/23/2019, 01/25/2020, 08/01/2020, 12/10/2022   PPD Test 12/20/2016   Pneumococcal Conjugate (Pcv15) 09/01/2018   Pneumococcal-Unspecified 09/01/2018   Tdap 12/28/2013   Unspecified SARS-COV-2 Vaccination 12/05/2020, 08/11/2021   Zoster  Recombinant(Shingrix) 10/30/2022, 01/30/2023   Pertinent  Health Maintenance Due  Topic Date Due   INFLUENZA VACCINE  Completed   DEXA SCAN  Discontinued      04/04/2021    8:00 AM 04/04/2021    8:46 PM 04/05/2021    9:00 PM 04/06/2021   11:00 AM 10/21/2022   12:35 PM  Fall Risk  Falls in the past year?     0  (RETIRED) Patient Fall Risk Level High fall risk High fall risk High fall risk High fall risk   Patient at Risk for Falls Due to     Impaired balance/gait;Impaired mobility  Fall risk Follow up     Falls evaluation completed   Functional Status Survey:    Vitals:   04/21/23 0927  BP: 132/75  Pulse: 65  Resp: 18  Temp: 98.2 F (36.8 C)  SpO2: 96%  Weight: 150 lb (68 kg)  Height: 5' 3 (1.6 m)   Body mass index is 26.57 kg/m. Physical Exam Constitutional:       General: She is not in acute distress.    Appearance: She is well-developed. She is not diaphoretic.  HENT:     Head: Normocephalic and atraumatic.     Mouth/Throat:     Pharynx: No oropharyngeal exudate.  Eyes:     Conjunctiva/sclera: Conjunctivae normal.     Pupils: Pupils are equal, round, and reactive to light.  Cardiovascular:     Rate and Rhythm: Normal rate and regular rhythm.     Heart sounds: Normal heart sounds.  Pulmonary:     Effort: Pulmonary effort is normal.     Breath sounds: Normal breath sounds.  Abdominal:     General: Bowel sounds are normal.     Palpations: Abdomen is soft.  Musculoskeletal:     Cervical back: Normal range of motion and neck supple.     Right lower leg: No edema.     Left lower leg: No edema.  Skin:    General: Skin is warm and dry.  Neurological:     Mental Status: She is alert. She is disoriented.     Motor: Weakness present.     Gait: Gait abnormal.  Psychiatric:        Mood and Affect: Mood normal.     Labs reviewed: Recent Labs    08/26/22 0000  NA 133*  K 4.4  CL 99  CO2 27*  BUN 16  CREATININE 0.9  CALCIUM  9.1   Recent Labs    08/26/22 0000  AST 20  ALT 10  ALKPHOS 82  ALBUMIN 3.4*   Recent Labs    07/30/22 0000  WBC 7.7  NEUTROABS 5,498.00  HGB 11.7*  HCT 35*  PLT 274   Lab Results  Component Value Date   TSH 3.60 08/26/2022   No results found for: HGBA1C No results found for: CHOL, HDL, LDLCALC, LDLDIRECT, TRIG, CHOLHDL  Significant Diagnostic Results in last 30 days:  No results found.  Assessment/Plan Dementia with paranoia Increase in paranoid delusions. Seroquel 12.5mg  has helped with hallucinations of bugs, but paranoia persists. No significant side effects from Seroquel. -Increase Seroquel to 25mg  daily. -Continue redirection and supportive care.  Osteoarthritis  Arthritic pain managed with routine Tylenol . -Continue Tylenol  as needed for pain.  Iron deficiency  anemia Last hemoglobin 11.7 in May. -Order CBC to update status.  Hypothyroidism Last TSH at goal (3.6) in June. Currently on NP Thyroid  daily. -Continue NP Thyroid .  Depression On Zoloft  150mg  at  bedtime. Daughter does not want dose decreased. -Continue Zoloft  150mg  at bedtime.  Osteoporosis On Fosamax  with Vitamin D . -Continue Fosamax  with Vitamin D .  Lactose intolerance Managed with Lactaid. -Continue Lactaid 2 tablets TID with meals.  General Health Maintenance -Order CMP to follow up on slightly low sodium and stable kidney function. -Continue supportive care.  Marilynne Dupuis K. Caro BODILY [facility] [phone]

## 2023-04-25 LAB — HEPATIC FUNCTION PANEL
ALT: 10 U/L (ref 7–35)
AST: 23 (ref 13–35)
Alkaline Phosphatase: 87 (ref 25–125)

## 2023-04-25 LAB — CBC AND DIFFERENTIAL
HCT: 37 (ref 36–46)
Hemoglobin: 11.8 — AB (ref 12.0–16.0)
Platelets: 196 10*3/uL (ref 150–400)
WBC: 6.4

## 2023-04-25 LAB — BASIC METABOLIC PANEL
BUN: 24 — AB (ref 4–21)
CO2: 29 — AB (ref 13–22)
Chloride: 100 (ref 99–108)
Creatinine: 1 (ref 0.5–1.1)
Glucose: 77
Potassium: 4.7 meq/L (ref 3.5–5.1)
Sodium: 133 — AB (ref 137–147)

## 2023-04-25 LAB — COMPREHENSIVE METABOLIC PANEL
Albumin: 3.4 — AB (ref 3.5–5.0)
Calcium: 8.8 (ref 8.7–10.7)
Globulin: 3.3
eGFR: 49

## 2023-04-25 LAB — CBC: RBC: 4.18 (ref 3.87–5.11)

## 2023-06-03 ENCOUNTER — Encounter: Payer: Self-pay | Admitting: Student

## 2023-06-03 ENCOUNTER — Non-Acute Institutional Stay (SKILLED_NURSING_FACILITY): Payer: Self-pay | Admitting: Student

## 2023-06-03 DIAGNOSIS — E039 Hypothyroidism, unspecified: Secondary | ICD-10-CM | POA: Diagnosis not present

## 2023-06-03 DIAGNOSIS — I10 Essential (primary) hypertension: Secondary | ICD-10-CM

## 2023-06-03 DIAGNOSIS — F330 Major depressive disorder, recurrent, mild: Secondary | ICD-10-CM

## 2023-06-03 DIAGNOSIS — G301 Alzheimer's disease with late onset: Secondary | ICD-10-CM

## 2023-06-03 DIAGNOSIS — R238 Other skin changes: Secondary | ICD-10-CM

## 2023-06-03 DIAGNOSIS — S72145S Nondisplaced intertrochanteric fracture of left femur, sequela: Secondary | ICD-10-CM | POA: Diagnosis not present

## 2023-06-03 DIAGNOSIS — F02C3 Dementia in other diseases classified elsewhere, severe, with mood disturbance: Secondary | ICD-10-CM

## 2023-06-03 NOTE — Progress Notes (Unsigned)
 Location:  Other [place].  Nursing Home Room Number: [facility] [address] Place of Service:  SNF [phone]) Provider:  Earnestine Mealing, MD  Patient Care Team: Earnestine Mealing, MD as PCP - General [facility] Medicine)  Extended Emergency Contact Information Primary Emergency Contact: Nile Riggs [place] of [place] Mobile Phone: [phone] Relation: Daughter Secondary Emergency Contact: Verne,fred Mobile Phone: [phone] Relation: Son  Code Status:  DNR Goals of care: Advanced Directive information    04/21/2023    9:36 AM  Advanced Directives  Does Patient Have a Medical Advance Directive? Yes  Type of Estate agent of [place];Out of facility DNR (pink MOST or yellow form);Living will  Does patient want to make changes to medical advance directive? No - Patient declined  Copy of Healthcare Power of Attorney in Chart? Yes - validated most recent copy scanned in chart (See row information)     Chief Complaint  Patient presents with   Medical Management of Chronic Issues    Medical Management of Chronic Issues.     HPI:  Pt is a 89 y.o. female seen today for medical management of chronic diseases.    Patient is sleeping at the dining table. Woke patient who states she knows how to feed herself picks up her fork with her left hand.   Per nursing, Patient with some skin breakdown on bilateral gluteal clefts. She is continent of bladder. She does have issues with independent transfers and is now requiring a lift.   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[facility])    adenocarcinoma of transverse colon   Depression    Fracture, metacarpal 06/2016   5th metacarpal   History of chicken pox    Hypertension    Hypothyroidism    Macular degeneration    left eye   Thyroid disease    Past Surgical History:  Procedure Laterality Date   CARPAL TUNNEL RELEASE  12/09/2010   CHOLECYSTECTOMY  1995   COLON SURGERY  02/25/2010    transverse colon resection   COLONOSCOPY     ESOPHAGOGASTRODUODENOSCOPY  02/25/2010   GANGLION CYST EXCISION Right 07/26/2016   Procedure: REMOVAL GANGLION OF WRIST;  Surgeon: Donato Heinz, MD;  Location: [facility] ORS;  Service: Orthopedics;  Laterality: Right;   GANGLION CYST EXCISION Right 11/10/2016   Procedure: REMOVAL GANGLION OF WRIST;  Surgeon: Donato Heinz, MD;  Location: [facility] ORS;  Service: Orthopedics;  Laterality: Right;   HERNIA REPAIR     INTRAMEDULLARY (IM) NAIL INTERTROCHANTERIC Left 09/30/2020   Procedure: INTRAMEDULLARY (IM) NAIL INTERTROCHANTRIC;  Surgeon: Signa Kell, MD;  Location: [facility] ORS;  Service: Orthopedics;  Laterality: Left;   MULTIPLE TOOTH EXTRACTIONS  06/2015   for dentures   UMBILICAL HERNIA REPAIR  02/25/2010    Allergies  Allergen Reactions   Celebrex [Celecoxib] Other (See Comments)    GI upset   Coconut (Cocos Nucifera)    Latex Itching    NEGATIVE LATEX IgE (< 0.10) 11/04/2016   Statins Other (See Comments)    Muscle pain   Strawberry (Diagnostic)    Adhesive [Tape] Rash    Outpatient Encounter Medications as of 06/03/2023  Medication Sig   acetaminophen (TYLENOL) 325 MG tablet Take 650 mg by mouth 3 (three) times daily.   calcium carbonate (TUMS - DOSED IN MG ELEMENTAL CALCIUM) 500 MG chewable tablet Chew 2 tablets (400 mg of elemental calcium total) by mouth 2 (two) times daily.   Carboxymethylcellulose Sod PF (REFRESH CELLUVISC) 1 %  GEL Instill one drop in both eyes twice daily   CHLORHEXIDINE GLUCONATE, BULK, SOLN 15 mLs by Does not apply route in the morning and at bedtime.   cholecalciferol (VITAMIN D) 1000 units tablet Take 1,000 Units by mouth daily.   dextromethorphan-guaiFENesin (TUSSIN DM) 10-100 MG/5ML liquid Take 10 mLs by mouth every 4 (four) hours as needed for cough.   diclofenac Sodium (VOLTAREN) 1 % GEL Apply 2gms to right shoulder topically every 12 hours as needed for pain.   donepezil (ARICEPT) 10 MG tablet Take 10 mg by  mouth at bedtime.    Emollient (CETAPHIL) cream Apply topically as needed.   Eyelid Cleansers (OCUSOFT EYELID CLEANSING) PADS Apply to both eyes topically in the evening every Wed, Sat   hydroxypropyl methylcellulose / hypromellose (ISOPTO TEARS / GONIOVISC) 2.5 % ophthalmic solution Place 1 drop into both eyes 2 (two) times daily as needed for dry eyes.   lactase (LACTAID) 3000 units tablet Take 6,000 Units by mouth 3 (three) times daily with meals.   magnesium hydroxide (MILK OF MAGNESIA) 400 MG/5ML suspension Take 30 mLs by mouth daily as needed for mild constipation.   memantine (NAMENDA) 10 MG tablet Take 10 mg by mouth 2 (two) times daily.   Multiple Vitamin (THEREMS PO) Take 1 tablet by mouth daily.   NP THYROID 15 MG tablet Take 15 mg by mouth every morning.   NP THYROID 60 MG tablet Take 60 mg by mouth in the morning.   olopatadine (PATANOL) 0.1 % ophthalmic solution Place 1 drop into the left eye daily.   QUEtiapine (SEROQUEL) 25 MG tablet Take 25 mg by mouth 2 (two) times daily.   sertraline (ZOLOFT) 100 MG tablet Take 150 mg by mouth at bedtime.   Zinc Oxide (TRIPLE PASTE) 12.8 % ointment Apply 1 Application topically. Every shift.   alendronate (FOSAMAX) 70 MG tablet Take 1 tablet (70 mg total) by mouth once a week. Take with a full glass of water on an empty stomach. (Patient not taking: Reported on 04/21/2023)   polyethylene glycol powder (GLYCOLAX/MIRALAX) 17 GM/SCOOP powder One scoop by mouth in the morning every Monday, Wednesday and Friday. (Patient not taking: Reported on 06/03/2023)   No facility-administered encounter medications on file as of 06/03/2023.    Review of Systems  Immunization History  Administered Date(s) Administered   Fluad Quad(high Dose 65+) 01/05/2023   Influenza-Unspecified 12/28/2011, 01/16/2013, 02/02/2014, 12/28/2014, 01/14/2015, 12/30/2015, 12/23/2016, 12/29/2021   Moderna Covid-19 Fall Seasonal Vaccine 58yrs & older 06/22/2022   Moderna  Sars-Covid-2 Vaccination 03/26/2019, 04/23/2019, 01/25/2020, 08/01/2020, 12/10/2022   PPD Test 12/20/2016   Pneumococcal Conjugate (Pcv15) 09/01/2018   Pneumococcal-Unspecified 09/01/2018   Tdap 12/28/2013   Unspecified SARS-COV-2 Vaccination 12/05/2020, 08/11/2021   Zoster Recombinant(Shingrix) 10/30/2022, 01/30/2023   Pertinent  Health Maintenance Due  Topic Date Due   INFLUENZA VACCINE  Completed   DEXA SCAN  Discontinued      04/04/2021    8:00 AM 04/04/2021    8:46 PM 04/05/2021    9:00 PM 04/06/2021   11:00 AM 10/21/2022   12:35 PM  Fall Risk  Falls in the past year?     0  (RETIRED) Patient Fall Risk Level High fall risk High fall risk High fall risk High fall risk   Patient at Risk for Falls Due to     Impaired balance/gait;Impaired mobility  Fall risk Follow up     Falls evaluation completed   Functional Status Survey:    Vitals:   06/03/23  1326 06/03/23 1336  BP: (!) 155/83 120/69  Pulse: 79   Resp: 16   Temp: (!) 97.2 F (36.2 C)   SpO2: 96%   Weight: 152 lb (68.9 kg)   Height: 5\' 3"  (1.6 m)    Body mass index is 26.93 kg/m. Physical Exam Constitutional:      Comments: Sleeping, easy to arouse  Cardiovascular:     Rate and Rhythm: Normal rate.     Pulses: Normal pulses.  Pulmonary:     Effort: Pulmonary effort is normal.     Comments: Decreased lung fields Abdominal:     General: Abdomen is flat.     Palpations: Abdomen is soft.  Skin:    General: Skin is warm.     Comments: Healing ecchymosis of face, deferred GU skin exam  Neurological:     Mental Status: She is disoriented.     Labs reviewed: Recent Labs    08/26/22 0000 04/25/23 0000  NA 133* 133*  K 4.4 4.7  CL 99 100  CO2 27* 29*  BUN 16 24*  CREATININE 0.9 1.0  CALCIUM 9.1 8.8   Recent Labs    08/26/22 0000 04/25/23 0000  AST 20 23  ALT 10 10  ALKPHOS 82 87  ALBUMIN 3.4* 3.4*   Recent Labs    07/30/22 0000 04/25/23 0000  WBC 7.7 6.4  NEUTROABS 5,498.00  --   HGB  11.7* 11.8*  HCT 35* 37  PLT 274 196   Lab Results  Component Value Date   TSH 3.60 08/26/2022   No results found for: "HGBA1C" No results found for: "CHOL", "HDL", "LDLCALC", "LDLDIRECT", "TRIG", "CHOLHDL"  Significant Diagnostic Results in last 30 days:  No results found.  Assessment/Plan Essential hypertension  Severe late onset Alzheimer's dementia with mood disturbance (HCC), Chronic  Hypothyroidism, unspecified type  Closed nondisplaced intertrochanteric fracture of left femur, sequela  Mild episode of recurrent major depressive disorder (HCC)  Skin breakdown Patient with history of dementia with continued cognitive decline. Poor PO intake, however, weight is stable. FAST stage 6c. BP at goal range at this time.  She has notable skin breakdown of the bilateral buttocks concerning for skin failure. Will offload as tolerate and apply barrier cream to prevent further skin changes. Encourage hydration. Nursing to moisturize skin daily. History of depression taking sertraline and seroquel. Patient has taken sertraline 150 mg daily for 2 years. Given stability, and goal to have minimum effective dose, will reduce dose of sertraline to 125 mg daily.  Family/ staff Communication: nursing  Labs/tests ordered:  none

## 2023-06-05 ENCOUNTER — Encounter: Payer: Self-pay | Admitting: Student

## 2023-06-15 ENCOUNTER — Encounter: Payer: Self-pay | Admitting: Student

## 2023-06-15 ENCOUNTER — Non-Acute Institutional Stay (SKILLED_NURSING_FACILITY): Payer: Self-pay | Admitting: Student

## 2023-06-15 DIAGNOSIS — R1031 Right lower quadrant pain: Secondary | ICD-10-CM | POA: Diagnosis not present

## 2023-06-15 NOTE — Progress Notes (Signed)
 Location:  Other [place].  Nursing Home Room Number: [facility] [address] Place of Service:  SNF [phone]) Provider:  Earnestine Mealing, MD  Patient Care Team: Earnestine Mealing, MD as PCP - General [facility] Medicine)  Extended Emergency Contact Information Primary Emergency Contact: Nile Riggs States of [place] Mobile Phone: [phone] Relation: Daughter Secondary Emergency Contact: Rule,fred Mobile Phone: [phone] Relation: Son  Code Status:  DNR Goals of care: Advanced Directive information    04/21/2023    9:36 AM  Advanced Directives  Does Patient Have a Medical Advance Directive? Yes  Type of Estate agent of [place];Out of facility DNR (pink MOST or yellow form);Living will  Does patient want to make changes to medical advance directive? No - Patient declined  Copy of Healthcare Power of Attorney in Chart? Yes - validated most recent copy scanned in chart (See row information)     Chief Complaint  Patient presents with   Abdominal Pain    Abdominal Pain.     HPI:  Pt is a 89 y.o. female seen today for an acute visit for Abdominal Pain.  Patient states she has abdominal pain and she says she has appendiciitis. Patient is disoriented at baseline. Nursing states patient has eaten   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[facility])    adenocarcinoma of transverse colon   Depression    Fracture, metacarpal 06/2016   5th metacarpal   History of chicken pox    Hypertension    Hypothyroidism    Macular degeneration    left eye   Thyroid disease    Past Surgical History:  Procedure Laterality Date   CARPAL TUNNEL RELEASE  12/09/2010   CHOLECYSTECTOMY  1995   COLON SURGERY  02/25/2010   transverse colon resection   COLONOSCOPY     ESOPHAGOGASTRODUODENOSCOPY  02/25/2010   GANGLION CYST EXCISION Right 07/26/2016   Procedure: REMOVAL GANGLION OF WRIST;  Surgeon: Donato Heinz, MD;  Location: [facility] ORS;  Service:  Orthopedics;  Laterality: Right;   GANGLION CYST EXCISION Right 11/10/2016   Procedure: REMOVAL GANGLION OF WRIST;  Surgeon: Donato Heinz, MD;  Location: [facility] ORS;  Service: Orthopedics;  Laterality: Right;   HERNIA REPAIR     INTRAMEDULLARY (IM) NAIL INTERTROCHANTERIC Left 09/30/2020   Procedure: INTRAMEDULLARY (IM) NAIL INTERTROCHANTRIC;  Surgeon: Signa Kell, MD;  Location: [facility] ORS;  Service: Orthopedics;  Laterality: Left;   MULTIPLE TOOTH EXTRACTIONS  06/2015   for dentures   UMBILICAL HERNIA REPAIR  02/25/2010    Allergies  Allergen Reactions   Celebrex [Celecoxib] Other (See Comments)    GI upset   Coconut (Cocos Nucifera)    Latex Itching    NEGATIVE LATEX IgE (< 0.10) 11/04/2016   Statins Other (See Comments)    Muscle pain   Strawberry (Diagnostic)    Adhesive [Tape] Rash    Outpatient Encounter Medications as of 06/15/2023  Medication Sig   acetaminophen (TYLENOL) 325 MG tablet Take 650 mg by mouth 3 (three) times daily.   calcium carbonate (TUMS - DOSED IN MG ELEMENTAL CALCIUM) 500 MG chewable tablet Chew 2 tablets (400 mg of elemental calcium total) by mouth 2 (two) times daily.   Carboxymethylcellulose Sod PF (REFRESH CELLUVISC) 1 % GEL Instill one drop in both eyes twice daily   CHLORHEXIDINE GLUCONATE, BULK, SOLN 15 mLs by Does not apply route in the morning and at bedtime.   cholecalciferol (VITAMIN D) 1000 units tablet Take 1,000 Units by mouth  daily.   dextromethorphan-guaiFENesin (TUSSIN DM) 10-100 MG/5ML liquid Take 10 mLs by mouth every 4 (four) hours as needed for cough.   diclofenac Sodium (VOLTAREN) 1 % GEL Apply 2gms to right shoulder topically every 12 hours as needed for pain.   donepezil (ARICEPT) 10 MG tablet Take 10 mg by mouth at bedtime.    Emollient (CETAPHIL) cream Apply topically as needed.   Eyelid Cleansers (OCUSOFT EYELID CLEANSING) PADS Apply to both eyes topically in the evening every Wed, Sat   hydroxypropyl methylcellulose / hypromellose  (ISOPTO TEARS / GONIOVISC) 2.5 % ophthalmic solution Place 1 drop into both eyes 2 (two) times daily as needed for dry eyes.   lactase (LACTAID) 3000 units tablet Take 6,000 Units by mouth 3 (three) times daily with meals.   magnesium hydroxide (MILK OF MAGNESIA) 400 MG/5ML suspension Take 30 mLs by mouth daily as needed for mild constipation.   memantine (NAMENDA) 10 MG tablet Take 10 mg by mouth 2 (two) times daily.   Multiple Vitamin (THEREMS PO) Take 1 tablet by mouth daily.   NP THYROID 15 MG tablet Take 15 mg by mouth every morning.   NP THYROID 60 MG tablet Take 60 mg by mouth in the morning.   olopatadine (PATANOL) 0.1 % ophthalmic solution Place 1 drop into the left eye daily.   polyethylene glycol (MIRALAX / GLYCOLAX) 17 g packet Take 17 g by mouth daily. Monday, Wednesday and Friday.   QUEtiapine (SEROQUEL) 25 MG tablet Take 25 mg by mouth 2 (two) times daily.   sertraline (ZOLOFT) 100 MG tablet Take 100 mg by mouth at bedtime.   sertraline (ZOLOFT) 25 MG tablet Take 25 mg by mouth at bedtime.   Zinc Oxide (TRIPLE PASTE) 12.8 % ointment Apply 1 Application topically. Every shift.   No facility-administered encounter medications on file as of 06/15/2023.    Review of Systems  Immunization History  Administered Date(s) Administered   Fluad Quad(high Dose 65+) 01/05/2023   Influenza-Unspecified 12/28/2011, 01/16/2013, 02/02/2014, 12/28/2014, 01/14/2015, 12/30/2015, 12/23/2016, 12/29/2021   Moderna Covid-19 Fall Seasonal Vaccine 55yrs & older 06/22/2022   Moderna Sars-Covid-2 Vaccination 03/26/2019, 04/23/2019, 01/25/2020, 08/01/2020, 12/10/2022   PPD Test 12/20/2016   Pneumococcal Conjugate (Pcv15) 09/01/2018   Pneumococcal-Unspecified 09/01/2018   Tdap 12/28/2013   Unspecified SARS-COV-2 Vaccination 12/05/2020, 08/11/2021   Zoster Recombinant(Shingrix) 10/30/2022, 01/30/2023   Pertinent  Health Maintenance Due  Topic Date Due   INFLUENZA VACCINE  10/14/2023   DEXA SCAN   Discontinued      04/04/2021    8:00 AM 04/04/2021    8:46 PM 04/05/2021    9:00 PM 04/06/2021   11:00 AM 10/21/2022   12:35 PM  Fall Risk  Falls in the past year?     0  (RETIRED) Patient Fall Risk Level High fall risk High fall risk High fall risk High fall risk   Patient at Risk for Falls Due to     Impaired balance/gait;Impaired mobility  Fall risk Follow up     Falls evaluation completed   Functional Status Survey:    Vitals:   06/15/23 1215  BP: 119/77  Pulse: 81  Resp: 18  Temp: 98.1 F (36.7 C)  SpO2: 92%  Weight: 148 lb 9.6 oz (67.4 kg)  Height: 5\' 3"  (1.6 m)   Body mass index is 26.32 kg/m. Physical Exam Constitutional:      Appearance: Normal appearance.     Comments: Sitting in wheelchair  Cardiovascular:     Rate and Rhythm:  Normal rate and regular rhythm.     Pulses: Normal pulses.     Heart sounds: Normal heart sounds.  Pulmonary:     Effort: Pulmonary effort is normal.  Abdominal:     General: Abdomen is flat. Bowel sounds are normal.     Palpations: Abdomen is soft.     Comments: RLQ TTP  Musculoskeletal:        General: No swelling or tenderness.  Skin:    General: Skin is warm and dry.  Neurological:     Mental Status: She is alert and oriented to person, place, and time.     Gait: Gait normal.  Psychiatric:        Mood and Affect: Mood normal.     Labs reviewed: Recent Labs    08/26/22 0000 04/25/23 0000  NA 133* 133*  K 4.4 4.7  CL 99 100  CO2 27* 29*  BUN 16 24*  CREATININE 0.9 1.0  CALCIUM 9.1 8.8   Recent Labs    08/26/22 0000 04/25/23 0000  AST 20 23  ALT 10 10  ALKPHOS 82 87  ALBUMIN 3.4* 3.4*   Recent Labs    07/30/22 0000 04/25/23 0000  WBC 7.7 6.4  NEUTROABS 5,498.00  --   HGB 11.7* 11.8*  HCT 35* 37  PLT 274 196   Lab Results  Component Value Date   TSH 3.60 08/26/2022   No results found for: "HGBA1C" No results found for: "CHOL", "HDL", "LDLCALC", "LDLDIRECT", "TRIG", "CHOLHDL"  Significant  Diagnostic Results in last 30 days:  No results found.  Assessment/Plan Right lower quadrant abdominal pain Patient states she has some abdominal pain. She is eating fine. Has some RLQ tenderness. She does not have diarrhea or vomiting at this time. Vital signs are stable. Based on current stability, will check labs and KUB. If patient has changes in stability will consider further work up.   Family/ staff Communication: nursing  Labs/tests ordered:  CBC, CMP, KUB

## 2023-08-02 ENCOUNTER — Encounter: Payer: Self-pay | Admitting: Nurse Practitioner

## 2023-08-02 ENCOUNTER — Non-Acute Institutional Stay (SKILLED_NURSING_FACILITY): Payer: Self-pay | Admitting: Nurse Practitioner

## 2023-08-02 DIAGNOSIS — E739 Lactose intolerance, unspecified: Secondary | ICD-10-CM

## 2023-08-02 DIAGNOSIS — D509 Iron deficiency anemia, unspecified: Secondary | ICD-10-CM

## 2023-08-02 DIAGNOSIS — E039 Hypothyroidism, unspecified: Secondary | ICD-10-CM | POA: Diagnosis not present

## 2023-08-02 DIAGNOSIS — M150 Primary generalized (osteo)arthritis: Secondary | ICD-10-CM

## 2023-08-02 DIAGNOSIS — I10 Essential (primary) hypertension: Secondary | ICD-10-CM | POA: Diagnosis not present

## 2023-08-02 DIAGNOSIS — F330 Major depressive disorder, recurrent, mild: Secondary | ICD-10-CM

## 2023-08-02 DIAGNOSIS — G301 Alzheimer's disease with late onset: Secondary | ICD-10-CM | POA: Diagnosis not present

## 2023-08-02 DIAGNOSIS — F02C3 Dementia in other diseases classified elsewhere, severe, with mood disturbance: Secondary | ICD-10-CM

## 2023-08-02 NOTE — Progress Notes (Signed)
 Location:  [place].  Nursing Home Room Number: [facility] [address] Place of Service:  SNF [phone]) Gilbert Lab, NP  PCP: Valrie Gehrig, MD  Patient Care Team: Valrie Gehrig, MD as PCP - General [facility])  Extended Emergency Contact Information Primary Emergency Contact: Shobert,Robin  [place] Mobile Phone: [phone] Relation: Daughter Secondary Emergency Contact: Quizhpi,fred Mobile Phone: [phone] Relation: Son  Goals of care: Advanced Directive information    08/02/2023    8:53 AM  Advanced Directives  Does Patient Have a Medical Advance Directive? Yes  Type of Estate agent of [place];Out of facility DNR (pink MOST or yellow form);Living will  Does patient want to make changes to medical advance directive? No - Patient declined  Copy of Healthcare Power of Attorney in Chart? Yes - validated most recent copy scanned in chart (See row information)     Chief Complaint  Patient presents with   Medical Management of Chronic Issues    Medical Management of Chronic Issues.     HPI:  Pt is a 89 y.o. female seen today for medical management of chronic disease.  Pt with dementia, htn, hypothyroid, anemia, depression Continues to follow up with dentist due to mouth ulcers around implanted post   Currently using magic mouthwash. Denies pain during visit but has advanced dementia  Gradual weight loss noted over the last 6 months.  Staff reports she continues to see bugs around her but no longer in her food.  They redirect and she is not getting agitated on current seroquel dosing.     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[facility])    adenocarcinoma of transverse colon   Depression    Fracture, metacarpal 06/2016   5th metacarpal   History of chicken pox    Hypertension    Hypothyroidism    Macular degeneration    left eye   Thyroid  disease    Past Surgical History:  Procedure  Laterality Date   CARPAL TUNNEL RELEASE  12/09/2010   CHOLECYSTECTOMY  1995   COLON SURGERY  02/25/2010   transverse colon resection   COLONOSCOPY     ESOPHAGOGASTRODUODENOSCOPY  02/25/2010   GANGLION CYST EXCISION Right 07/26/2016   Procedure: REMOVAL GANGLION OF WRIST;  Surgeon: Arlyne Lame, MD;  Location: [facility] ORS;  Service: Orthopedics;  Laterality: Right;   GANGLION CYST EXCISION Right 11/10/2016   Procedure: REMOVAL GANGLION OF WRIST;  Surgeon: Arlyne Lame, MD;  Location: [facility] ORS;  Service: Orthopedics;  Laterality: Right;   HERNIA REPAIR     INTRAMEDULLARY (IM) NAIL INTERTROCHANTERIC Left 09/30/2020   Procedure: INTRAMEDULLARY (IM) NAIL INTERTROCHANTRIC;  Surgeon: Lorri Rota, MD;  Location: [facility] ORS;  Service: Orthopedics;  Laterality: Left;   MULTIPLE TOOTH EXTRACTIONS  06/2015   for dentures   UMBILICAL HERNIA REPAIR  02/25/2010    Allergies  Allergen Reactions   Celebrex [Celecoxib] Other (See Comments)    GI upset   Coconut (Cocos Nucifera)    Latex Itching    NEGATIVE LATEX IgE (< 0.10) 11/04/2016   Statins Other (See Comments)    Muscle pain   Strawberry (Diagnostic)    Adhesive [Tape] Rash    Outpatient Encounter Medications as of 08/02/2023  Medication Sig   acetaminophen  (TYLENOL ) 325 MG tablet Take 650 mg by mouth 3 (three) times daily.   calcium  carbonate (TUMS - DOSED IN MG ELEMENTAL CALCIUM ) 500 MG chewable tablet Chew 2 tablets (400 mg of elemental calcium   total) by mouth 2 (two) times daily.   Carboxymethylcellulose Sod PF (REFRESH CELLUVISC) 1 % GEL Instill one drop in both eyes twice daily   CHLORHEXIDINE  GLUCONATE, BULK, SOLN 15 mLs by Does not apply route in the morning and at bedtime.   cholecalciferol  (VITAMIN D ) 1000 units tablet Take 1,000 Units by mouth daily.   dextromethorphan-guaiFENesin (TUSSIN DM) 10-100 MG/5ML liquid Take 10 mLs by mouth every 4 (four) hours as needed for cough.   diclofenac Sodium (VOLTAREN) 1 % GEL Apply 2gms to  right shoulder topically every 12 hours as needed for pain.   donepezil  (ARICEPT ) 10 MG tablet Take 10 mg by mouth at bedtime.    Emollient (CETAPHIL) cream Apply topically as needed.   Eyelid Cleansers (OCUSOFT EYELID CLEANSING) PADS Apply to both eyes topically in the evening every Wed, Sat   hydroxypropyl methylcellulose / hypromellose (ISOPTO TEARS / GONIOVISC) 2.5 % ophthalmic solution Place 1 drop into both eyes 2 (two) times daily as needed for dry eyes.   lactase (LACTAID) 3000 units tablet Take 6,000 Units by mouth 3 (three) times daily with meals.   magic mouthwash SOLN Take 10 mLs by mouth 4 (four) times daily as needed for mouth pain.   magnesium  hydroxide (MILK OF MAGNESIA) 400 MG/5ML suspension Take 30 mLs by mouth daily as needed for mild constipation.   memantine  (NAMENDA ) 10 MG tablet Take 10 mg by mouth 2 (two) times daily.   Multiple Vitamin (THEREMS PO) Take 1 tablet by mouth daily.   NP THYROID  15 MG tablet Take 15 mg by mouth every morning.   NP THYROID  60 MG tablet Take 60 mg by mouth in the morning.   olopatadine  (PATANOL) 0.1 % ophthalmic solution Place 1 drop into the left eye daily.   polyethylene glycol (MIRALAX  / GLYCOLAX ) 17 g packet Take 17 g by mouth daily. Monday, Wednesday and Friday.   QUEtiapine (SEROQUEL) 25 MG tablet Take 25 mg by mouth at bedtime. Give Two tablets by mouth in the morning.   senna-docusate (SENOKOT-S) 8.6-50 MG tablet Take 2 tablets by mouth at bedtime.   sertraline  (ZOLOFT ) 100 MG tablet Take 100 mg by mouth at bedtime.   sertraline  (ZOLOFT ) 25 MG tablet Take 25 mg by mouth at bedtime.   Zinc Oxide (TRIPLE PASTE) 12.8 % ointment Apply 1 Application topically. Every shift.   No facility-administered encounter medications on file as of 08/02/2023.    Review of Systems  Unable to perform ROS: Dementia    Immunization History  Administered Date(s) Administered   Fluad Quad(high Dose 65+) 01/05/2023   Influenza-Unspecified 12/28/2011,  01/16/2013, 02/02/2014, 12/28/2014, 01/14/2015, 12/30/2015, 12/23/2016, 12/29/2021   Moderna Covid-19 Fall Seasonal Vaccine 4yrs & older 06/22/2022   Moderna Sars-Covid-2 Vaccination 03/26/2019, 04/23/2019, 01/25/2020, 08/01/2020, 12/10/2022   PPD Test 12/20/2016   Pneumococcal Conjugate (Pcv15) 09/01/2018   Pneumococcal-Unspecified 09/01/2018   Tdap 12/28/2013   Unspecified SARS-COV-2 Vaccination 12/05/2020, 08/11/2021   Zoster Recombinant(Shingrix) 10/30/2022, 01/30/2023   Pertinent  Health Maintenance Due  Topic Date Due   INFLUENZA VACCINE  10/14/2023   DEXA SCAN  Discontinued      04/04/2021    8:00 AM 04/04/2021    8:46 PM 04/05/2021    9:00 PM 04/06/2021   11:00 AM 10/21/2022   12:35 PM  Fall Risk  Falls in the past year?     0  (RETIRED) Patient Fall Risk Level High fall risk High fall risk High fall risk High fall risk   Patient at Risk  for Falls Due to     Impaired balance/gait;Impaired mobility  Fall risk Follow up     Falls evaluation completed   Functional Status Survey:    Vitals:   08/02/23 0843  BP: 122/76  Pulse: 78  Resp: 18  Temp: 97.8 F (36.6 C)  SpO2: 94%  Weight: 149 lb 12.8 oz (67.9 kg)  Height: 5\' 3"  (1.6 m)   Body mass index is 26.54 kg/m. Physical Exam Constitutional:      General: She is not in acute distress.    Appearance: She is well-developed. She is not diaphoretic.  HENT:     Head: Normocephalic and atraumatic.     Mouth/Throat:     Pharynx: No oropharyngeal exudate.  Eyes:     Conjunctiva/sclera: Conjunctivae normal.     Pupils: Pupils are equal, round, and reactive to light.  Cardiovascular:     Rate and Rhythm: Normal rate and regular rhythm.     Heart sounds: Normal heart sounds.  Pulmonary:     Effort: Pulmonary effort is normal.     Breath sounds: Normal breath sounds.  Abdominal:     General: Bowel sounds are normal.     Palpations: Abdomen is soft.  Musculoskeletal:     Cervical back: Normal range of motion and neck  supple.     Right lower leg: No edema.     Left lower leg: No edema.  Skin:    General: Skin is warm and dry.  Neurological:     Mental Status: She is alert.  Psychiatric:        Mood and Affect: Mood normal.     Labs reviewed: Recent Labs    08/26/22 0000 04/25/23 0000  NA 133* 133*  K 4.4 4.7  CL 99 100  CO2 27* 29*  BUN 16 24*  CREATININE 0.9 1.0  CALCIUM  9.1 8.8   Recent Labs    08/26/22 0000 04/25/23 0000  AST 20 23  ALT 10 10  ALKPHOS 82 87  ALBUMIN 3.4* 3.4*   Recent Labs    04/25/23 0000  WBC 6.4  HGB 11.8*  HCT 37  PLT 196   Lab Results  Component Value Date   TSH 3.60 08/26/2022   No results found for: "HGBA1C" No results found for: "CHOL", "HDL", "LDLCALC", "LDLDIRECT", "TRIG", "CHOLHDL"  Significant Diagnostic Results in last 30 days:  No results found.  Assessment/Plan Essential hypertension Controlled, not current on medication  Hypothyroidism Follow up TSH, monitor yearly  Continues on NP thyroid  75 mg daily  Iron deficiency anemia Blood counts stable on recent lab, monitor every 6 months  Lactose intolerance Continues on lactaid with meals  Primary osteoarthritis involving multiple joints Stable on tylenol  TID  Severe late onset Alzheimer's dementia with mood disturbance (HCC) Stable, no acute changes in cognitive or functional status, continue supportive care. Continues on namenda  and aricept .  Continues on seroquel due to hallucinations which are ongoing but overall controlled.   Depression Stable on zoloft  125 mg daily has tolerated GDR.      Oskar Cretella K. Denney Fisherman [facility] [phone]

## 2023-08-03 DIAGNOSIS — M150 Primary generalized (osteo)arthritis: Secondary | ICD-10-CM | POA: Insufficient documentation

## 2023-08-03 DIAGNOSIS — E739 Lactose intolerance, unspecified: Secondary | ICD-10-CM | POA: Insufficient documentation

## 2023-08-03 NOTE — Assessment & Plan Note (Signed)
 Controlled, not current on medication

## 2023-08-03 NOTE — Assessment & Plan Note (Signed)
 Stable on tylenol TID

## 2023-08-03 NOTE — Assessment & Plan Note (Signed)
 Follow up TSH, monitor yearly  Continues on NP thyroid  75 mg daily

## 2023-08-03 NOTE — Assessment & Plan Note (Signed)
 Stable, no acute changes in cognitive or functional status, continue supportive care. Continues on namenda  and aricept .  Continues on seroquel due to hallucinations which are ongoing but overall controlled.

## 2023-08-03 NOTE — Assessment & Plan Note (Signed)
 Stable on zoloft  125 mg daily has tolerated GDR.

## 2023-08-03 NOTE — Assessment & Plan Note (Signed)
 Continues on lactaid with meals

## 2023-08-03 NOTE — Assessment & Plan Note (Signed)
 Blood counts stable on recent lab, monitor every 6 months

## 2023-09-28 ENCOUNTER — Non-Acute Institutional Stay (SKILLED_NURSING_FACILITY): Payer: Self-pay | Admitting: Student

## 2023-09-28 ENCOUNTER — Encounter: Payer: Self-pay | Admitting: Student

## 2023-09-28 DIAGNOSIS — F02C3 Dementia in other diseases classified elsewhere, severe, with mood disturbance: Secondary | ICD-10-CM | POA: Diagnosis not present

## 2023-09-28 DIAGNOSIS — G301 Alzheimer's disease with late onset: Secondary | ICD-10-CM

## 2023-09-28 DIAGNOSIS — R443 Hallucinations, unspecified: Secondary | ICD-10-CM

## 2023-09-28 NOTE — Progress Notes (Signed)
 Location:  Other [place].  Nursing Home Room Number: [facility] [address] Place of Service:  SNF [phone]) Provider:  Abdul Fine, MD  Patient Care Team: Abdul Fine, MD as PCP - General [facility])  Extended Emergency Contact Information Primary Emergency Contact: Shobert,Robin  [place] Mobile Phone: [phone] Relation: Daughter Secondary Emergency Contact: Chermak,fred Mobile Phone: [phone] Relation: Son  Code Status:  DNR Goals of care: Advanced Directive information    08/02/2023    8:53 AM  Advanced Directives  Does Patient Have a Medical Advance Directive? Yes  Type of Estate agent of [place];Out of facility DNR (pink MOST or yellow form);Living will  Does patient want to make changes to medical advance directive? No - Patient declined  Copy of Healthcare Power of Attorney in Chart? Yes - validated most recent copy scanned in chart (See row information)     Chief Complaint  Patient presents with   Medication Management    Medication Management     HPI:  Pt is a 89 y.o. female seen today for Medication Management and Routine Visit. Patient continues to have visual hallucinations. Per nursing, she sees bugs at the meal table often. Recent medications without significant improvement.    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[facility])    adenocarcinoma of transverse colon   Depression    Fracture, metacarpal 06/2016   5th metacarpal   History of chicken pox    Hypertension    Hypothyroidism    Macular degeneration    left eye   Thyroid  disease    Past Surgical History:  Procedure Laterality Date   CARPAL TUNNEL RELEASE  12/09/2010   CHOLECYSTECTOMY  1995   COLON SURGERY  02/25/2010   transverse colon resection   COLONOSCOPY     ESOPHAGOGASTRODUODENOSCOPY  02/25/2010   GANGLION CYST EXCISION Right 07/26/2016   Procedure: REMOVAL GANGLION OF WRIST;  Surgeon: Mardee Lynwood SQUIBB, MD;   Location: [facility] ORS;  Service: Orthopedics;  Laterality: Right;   GANGLION CYST EXCISION Right 11/10/2016   Procedure: REMOVAL GANGLION OF WRIST;  Surgeon: Mardee Lynwood SQUIBB, MD;  Location: [facility] ORS;  Service: Orthopedics;  Laterality: Right;   HERNIA REPAIR     INTRAMEDULLARY (IM) NAIL INTERTROCHANTERIC Left 09/30/2020   Procedure: INTRAMEDULLARY (IM) NAIL INTERTROCHANTRIC;  Surgeon: Tobie Priest, MD;  Location: [facility] ORS;  Service: Orthopedics;  Laterality: Left;   MULTIPLE TOOTH EXTRACTIONS  06/2015   for dentures   UMBILICAL HERNIA REPAIR  02/25/2010    Allergies  Allergen Reactions   Celebrex [Celecoxib] Other (See Comments)    GI upset   Coconut (Cocos Nucifera)    Latex Itching    NEGATIVE LATEX IgE (< 0.10) 11/04/2016   Statins Other (See Comments)    Muscle pain   Strawberry (Diagnostic)    Adhesive [Tape] Rash    Outpatient Encounter Medications as of 09/28/2023  Medication Sig   acetaminophen  (TYLENOL ) 325 MG tablet Take 650 mg by mouth 3 (three) times daily.   calcium  carbonate (TUMS - DOSED IN MG ELEMENTAL CALCIUM ) 500 MG chewable tablet Chew 2 tablets (400 mg of elemental calcium  total) by mouth 2 (two) times daily.   Carboxymethylcellulose Sod PF (REFRESH CELLUVISC) 1 % GEL Instill one drop in both eyes twice daily   CHLORHEXIDINE  GLUCONATE, BULK, SOLN 15 mLs by Does not apply route in the morning and at bedtime.   cholecalciferol  (VITAMIN D ) 1000 units tablet Take 1,000 Units by mouth daily.  dextromethorphan-guaiFENesin (TUSSIN DM) 10-100 MG/5ML liquid Take 10 mLs by mouth every 4 (four) hours as needed for cough.   diclofenac Sodium (VOLTAREN) 1 % GEL Apply 2gms to right shoulder topically every 12 hours as needed for pain.   donepezil  (ARICEPT ) 10 MG tablet Take 10 mg by mouth at bedtime.    Emollient (CETAPHIL) cream Apply topically as needed.   Eyelid Cleansers (OCUSOFT EYELID CLEANSING) PADS Apply to both eyes topically in the evening every Wed, Sat   hydroxypropyl  methylcellulose / hypromellose (ISOPTO TEARS / GONIOVISC) 2.5 % ophthalmic solution Place 1 drop into both eyes 2 (two) times daily as needed for dry eyes.   lactase (LACTAID) 3000 units tablet Take 6,000 Units by mouth 3 (three) times daily with meals.   magic mouthwash SOLN Take 10 mLs by mouth 4 (four) times daily as needed for mouth pain.   magnesium  hydroxide (MILK OF MAGNESIA) 400 MG/5ML suspension Take 30 mLs by mouth daily as needed for mild constipation.   memantine  (NAMENDA ) 10 MG tablet Take 10 mg by mouth 2 (two) times daily.   Multiple Vitamin (THEREMS PO) Take 1 tablet by mouth daily.   NP THYROID  15 MG tablet Take 15 mg by mouth every morning.   NP THYROID  60 MG tablet Take 60 mg by mouth in the morning.   olopatadine  (PATANOL) 0.1 % ophthalmic solution Place 1 drop into the left eye daily.   polyethylene glycol (MIRALAX  / GLYCOLAX ) 17 g packet Take 17 g by mouth daily. Monday, Wednesday and Friday.   QUEtiapine (SEROQUEL) 25 MG tablet Take 25 mg by mouth at bedtime. Give Two tablets by mouth in the morning. (Patient taking differently: Take 25 mg by mouth 2 (two) times daily.)   senna-docusate (SENOKOT-S) 8.6-50 MG tablet Take 2 tablets by mouth at bedtime.   sertraline  (ZOLOFT ) 100 MG tablet Take 100 mg by mouth at bedtime.   sertraline  (ZOLOFT ) 25 MG tablet Take 25 mg by mouth at bedtime.   Zinc Oxide (TRIPLE PASTE) 12.8 % ointment Apply 1 Application topically. Every shift.   No facility-administered encounter medications on file as of 09/28/2023.    Review of Systems  Immunization History  Administered Date(s) Administered   Fluad Quad(high Dose 65+) 01/05/2023   Influenza-Unspecified 12/28/2011, 01/16/2013, 02/02/2014, 12/28/2014, 01/14/2015, 12/30/2015, 12/23/2016, 12/29/2021   Moderna Covid-19 Fall Seasonal Vaccine 22yrs & older 06/22/2022   Moderna Sars-Covid-2 Vaccination 03/26/2019, 04/23/2019, 01/25/2020, 08/01/2020, 12/10/2022   PPD Test 12/20/2016    Pneumococcal Conjugate (Pcv15) 09/01/2018   Pneumococcal-Unspecified 09/01/2018   Tdap 12/28/2013   Unspecified SARS-COV-2 Vaccination 12/05/2020, 08/11/2021   Zoster Recombinant(Shingrix) 10/30/2022, 01/30/2023   Pertinent  Health Maintenance Due  Topic Date Due   INFLUENZA VACCINE  10/14/2023   DEXA SCAN  Discontinued      04/04/2021    8:00 AM 04/04/2021    8:46 PM 04/05/2021    9:00 PM 04/06/2021   11:00 AM 10/21/2022   12:35 PM  Fall Risk  Falls in the past year?     0  (RETIRED) Patient Fall Risk Level High fall risk  High fall risk  High fall risk  High fall risk    Patient at Risk for Falls Due to     Impaired balance/gait;Impaired mobility  Fall risk Follow up     Falls evaluation completed     Data saved with a previous flowsheet row definition   Functional Status Survey:    Vitals:   09/28/23 1303  BP: 118/70  Pulse: 80  Resp: 18  Temp: 98 F (36.7 C)  SpO2: 94%  Weight: 151 lb 9.6 oz (68.8 kg)  Height: 5' 3 (1.6 m)   Body mass index is 26.85 kg/m. Physical Exam Constitutional:      Appearance: Normal appearance.  Cardiovascular:     Rate and Rhythm: Normal rate.  Neurological:     Mental Status: She is alert.     Comments: Alert, oriented to self     Labs reviewed: Recent Labs    04/25/23 0000  NA 133*  K 4.7  CL 100  CO2 29*  BUN 24*  CREATININE 1.0  CALCIUM  8.8   Recent Labs    04/25/23 0000  AST 23  ALT 10  ALKPHOS 87  ALBUMIN 3.4*   Recent Labs    04/25/23 0000  WBC 6.4  HGB 11.8*  HCT 37  PLT 196   Lab Results  Component Value Date   TSH 3.60 08/26/2022   No results found for: HGBA1C No results found for: CHOL, HDL, LDLCALC, LDLDIRECT, TRIG, CHOLHDL  Significant Diagnostic Results in last 30 days:  No results found.  Assessment/Plan Severe late onset Alzheimer's dementia with mood disturbance (HCC) Patient would benefit from increased dosing in antipsychotic therapy as her hallucinations have  continued to cause distress and discomfort. Redirection ineffective.  Family/ staff Communication: Nursing to contact family.   Labs/tests ordered:  none

## 2023-10-24 ENCOUNTER — Non-Acute Institutional Stay (SKILLED_NURSING_FACILITY): Payer: Self-pay | Admitting: Student

## 2023-10-24 DIAGNOSIS — E039 Hypothyroidism, unspecified: Secondary | ICD-10-CM

## 2023-10-24 DIAGNOSIS — G301 Alzheimer's disease with late onset: Secondary | ICD-10-CM | POA: Diagnosis not present

## 2023-10-24 DIAGNOSIS — F02C3 Dementia in other diseases classified elsewhere, severe, with mood disturbance: Secondary | ICD-10-CM

## 2023-10-24 DIAGNOSIS — R443 Hallucinations, unspecified: Secondary | ICD-10-CM

## 2023-10-24 DIAGNOSIS — D509 Iron deficiency anemia, unspecified: Secondary | ICD-10-CM

## 2023-10-24 DIAGNOSIS — I10 Essential (primary) hypertension: Secondary | ICD-10-CM | POA: Diagnosis not present

## 2023-10-30 ENCOUNTER — Encounter: Payer: Self-pay | Admitting: Student

## 2023-10-30 NOTE — Progress Notes (Signed)
 [facility] Provider: Falicia Lizotte  Code Status: DNR Goals of Care:     08/02/2023    8:53 AM  Advanced Directives  Does Patient Have a Medical Advance Directive? Yes  Type of Estate agent of [place];Out of facility DNR (pink MOST or yellow form);Living will  Does patient want to make changes to medical advance directive? No - Patient declined  Copy of Healthcare Power of Attorney in Chart? Yes - validated most recent copy scanned in chart (See row information)     Chief Complaint  Patient presents with   Medical Management of Chronic Issues    HPI: Patient is a 89 y.o. female seen today for an Routine visit for Discussed the use of AI scribe software for clinical note transcription with the patient, who gave verbal consent to proceed.  History of Present Illness  History of Present Illness The patient, with dementia, presents with continued decline and visual hallucinations. Her children are at bedside discussing her current status.  She has been experiencing visual hallucinations, specifically seeing spiders and bugs, which have impacted her daily life. She often avoids meals due to seeing bugs in her food, despite maintaining a stable weight.  Her oral intake has decreased, as she frequently sleeps through meals or avoids them. However, she consistently drinks protein shakes.  A care plan meeting was held today to discuss her dietary needs.  She has a 'do not hospitalize' order in place.  Past Medical History - Dementia with visual hallucinations  Social History - The patient has dementia and experiences visual hallucinations. She has decreased oral intake due to hallucinations but consistently drinks protein shakes. A care plan meeting discussed dietary changes to minced and moist food. She has a do not hospitalize order. Family involvement in care decisions is noted, with a request for a meeting with a family member in medicine  before medication changes.   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[facility])    adenocarcinoma of transverse colon   Depression    Fracture, metacarpal 06/2016   5th metacarpal   History of chicken pox    Hypertension    Hypothyroidism    Macular degeneration    left eye   Thyroid  disease     Past Surgical History:  Procedure Laterality Date   CARPAL TUNNEL RELEASE  12/09/2010   CHOLECYSTECTOMY  1995   COLON SURGERY  02/25/2010   transverse colon resection   COLONOSCOPY     ESOPHAGOGASTRODUODENOSCOPY  02/25/2010   GANGLION CYST EXCISION Right 07/26/2016   Procedure: REMOVAL GANGLION OF WRIST;  Surgeon: Mardee Lynwood SQUIBB, MD;  Location: [facility] ORS;  Service: Orthopedics;  Laterality: Right;   GANGLION CYST EXCISION Right 11/10/2016   Procedure: REMOVAL GANGLION OF WRIST;  Surgeon: Mardee Lynwood SQUIBB, MD;  Location: [facility] ORS;  Service: Orthopedics;  Laterality: Right;   HERNIA REPAIR     INTRAMEDULLARY (IM) NAIL INTERTROCHANTERIC Left 09/30/2020   Procedure: INTRAMEDULLARY (IM) NAIL INTERTROCHANTRIC;  Surgeon: Tobie Priest, MD;  Location: [facility] ORS;  Service: Orthopedics;  Laterality: Left;   MULTIPLE TOOTH EXTRACTIONS  06/2015   for dentures   UMBILICAL HERNIA REPAIR  02/25/2010    Allergies  Allergen Reactions   Celebrex [Celecoxib] Other (See Comments)    GI upset   Coconut (Cocos Nucifera)    Latex Itching    NEGATIVE LATEX IgE (< 0.10) 11/04/2016   Statins Other (See Comments)    Muscle pain  Strawberry (Diagnostic)    Adhesive [Tape] Rash    Outpatient Encounter Medications as of 10/24/2023  Medication Sig   EPINEPHrine 0.3 mg/0.3 mL IJ SOAJ injection SMARTSIG:1 Milligram(s) IM Daily   acetaminophen  (TYLENOL ) 325 MG tablet Take 650 mg by mouth 3 (three) times daily.   calcium  carbonate (TUMS - DOSED IN MG ELEMENTAL CALCIUM ) 500 MG chewable tablet Chew 2 tablets (400 mg of elemental calcium  total) by mouth 2 (two) times daily.   Carboxymethylcellulose Sod PF  (REFRESH CELLUVISC) 1 % GEL Instill one drop in both eyes twice daily   CHLORHEXIDINE  GLUCONATE, BULK, SOLN 15 mLs by Does not apply route in the morning and at bedtime.   cholecalciferol  (VITAMIN D ) 1000 units tablet Take 1,000 Units by mouth daily.   dextromethorphan-guaiFENesin (TUSSIN DM) 10-100 MG/5ML liquid Take 10 mLs by mouth every 4 (four) hours as needed for cough.   diclofenac Sodium (VOLTAREN) 1 % GEL Apply 2gms to right shoulder topically every 12 hours as needed for pain.   donepezil  (ARICEPT ) 10 MG tablet Take 10 mg by mouth at bedtime.    Emollient (CETAPHIL) cream Apply topically as needed.   Eyelid Cleansers (OCUSOFT EYELID CLEANSING) PADS Apply to both eyes topically in the evening every Wed, Sat   hydroxypropyl methylcellulose / hypromellose (ISOPTO TEARS / GONIOVISC) 2.5 % ophthalmic solution Place 1 drop into both eyes 2 (two) times daily as needed for dry eyes.   lactase (LACTAID) 3000 units tablet Take 6,000 Units by mouth 3 (three) times daily with meals.   magic mouthwash SOLN Take 10 mLs by mouth 4 (four) times daily as needed for mouth pain.   magnesium  hydroxide (MILK OF MAGNESIA) 400 MG/5ML suspension Take 30 mLs by mouth daily as needed for mild constipation.   memantine  (NAMENDA ) 10 MG tablet Take 10 mg by mouth 2 (two) times daily.   Multiple Vitamin (THEREMS PO) Take 1 tablet by mouth daily.   NP THYROID  15 MG tablet Take 15 mg by mouth every morning.   NP THYROID  60 MG tablet Take 60 mg by mouth in the morning.   olopatadine  (PATANOL) 0.1 % ophthalmic solution Place 1 drop into the left eye daily.   polyethylene glycol (MIRALAX  / GLYCOLAX ) 17 g packet Take 17 g by mouth daily. Monday, Wednesday and Friday.   QUEtiapine (SEROQUEL) 25 MG tablet Take 25 mg by mouth at bedtime. Give Two tablets by mouth in the morning. (Patient taking differently: Take 25 mg by mouth 2 (two) times daily.)   senna-docusate (SENOKOT-S) 8.6-50 MG tablet Take 2 tablets by mouth at  bedtime.   sertraline  (ZOLOFT ) 100 MG tablet Take 100 mg by mouth at bedtime.   sertraline  (ZOLOFT ) 25 MG tablet Take 25 mg by mouth at bedtime.   Zinc Oxide (TRIPLE PASTE) 12.8 % ointment Apply 1 Application topically. Every shift.   No facility-administered encounter medications on file as of 10/24/2023.    Review of Systems:  Review of Systems  Health Maintenance  Topic Date Due   Pneumococcal Vaccine: 50+ Years (2 of 2 - PPSV23) 09/01/2019   COVID-19 Vaccine (9 - 2024-25 season) 02/04/2023   Medicare Annual Wellness (AWV)  10/21/2023   INFLUENZA VACCINE  10/14/2023   DTaP/Tdap/Td (2 - Td or Tdap) 12/29/2023   Zoster Vaccines- Shingrix  Completed   HPV VACCINES  Aged Out   Meningococcal B Vaccine  Aged Out   DEXA SCAN  Discontinued    Physical Exam: There were no vitals filed for this  visit. There is no height or weight on file to calculate BMI. Physical Exam Cardiovascular:     Rate and Rhythm: Normal rate.     Pulses: Normal pulses.  Pulmonary:     Effort: Pulmonary effort is normal.  Neurological:     Mental Status: She is alert. She is disoriented.    Physical Exam    Labs reviewed: Basic Metabolic Panel: Recent Labs    04/25/23 0000  NA 133*  K 4.7  CL 100  CO2 29*  BUN 24*  CREATININE 1.0  CALCIUM  8.8   Liver Function Tests: Recent Labs    04/25/23 0000  AST 23  ALT 10  ALKPHOS 87  ALBUMIN 3.4*   No results for input(s): LIPASE, AMYLASE in the last 8760 hours. No results for input(s): AMMONIA in the last 8760 hours. CBC: Recent Labs    04/25/23 0000  WBC 6.4  HGB 11.8*  HCT 37  PLT 196   Lipid Panel: No results for input(s): CHOL, HDL, LDLCALC, TRIG, CHOLHDL, LDLDIRECT in the last 8760 hours. No results found for: HGBA1C  Procedures since last visit: No results found. Results    Assessment/Plan Hallucinations Continued decline with visual hallucinations, specifically seeing spiders and bugs, affecting  oral intake as she avoids meals due to seeing bugs in them.  Dementia and decreased oral intake Dementia with associated decreased oral intake, often sleeping through meals or avoiding them due to hallucinations. Weight is well-managed despite decreased intake. Consistently drinks protein shakes. - Implement minced and moist diet to improve oral intake. - Organize family meeting with social work assistance to discuss medication changes.  Hypothyroidism Discuss need/indications for this to be continued  Anemia Chronic, remains stable.    Labs/tests ordered:  * No order type specified * Encourage family to contact nursing about when to have a family meeting virtually Next appt:  Visit date not found

## 2023-11-21 ENCOUNTER — Telehealth: Payer: Self-pay | Admitting: Student

## 2023-11-21 DIAGNOSIS — U071 COVID-19: Secondary | ICD-10-CM

## 2023-11-21 MED ORDER — NIRMATRELVIR/RITONAVIR (PAXLOVID) TABLET (RENAL DOSING): 2.0000 | ORAL_TABLET | Freq: Two times a day (BID) | ORAL | 0 refills | Status: AC

## 2023-11-21 NOTE — Telephone Encounter (Signed)
 Patient with vomiting secondary to covid infection. Will decrease seroquel to 12.5 mg while on Paxlovid  BID for 5 days.

## 2023-11-23 ENCOUNTER — Encounter: Payer: Self-pay | Admitting: Student

## 2023-11-23 ENCOUNTER — Non-Acute Institutional Stay (SKILLED_NURSING_FACILITY): Payer: Self-pay | Admitting: Student

## 2023-11-23 DIAGNOSIS — G301 Alzheimer's disease with late onset: Secondary | ICD-10-CM

## 2023-11-23 DIAGNOSIS — E039 Hypothyroidism, unspecified: Secondary | ICD-10-CM

## 2023-11-23 DIAGNOSIS — F330 Major depressive disorder, recurrent, mild: Secondary | ICD-10-CM | POA: Diagnosis not present

## 2023-11-23 DIAGNOSIS — I10 Essential (primary) hypertension: Secondary | ICD-10-CM | POA: Diagnosis not present

## 2023-11-23 DIAGNOSIS — F02C3 Dementia in other diseases classified elsewhere, severe, with mood disturbance: Secondary | ICD-10-CM

## 2023-11-23 NOTE — Progress Notes (Unsigned)
 Location:  Other [place].  Nursing Home Room Number: [facility] [address] Place of Service:  SNF [phone]) Provider:  Abdul Fine, MD  Patient Care Team: Abdul Fine, MD as PCP - General [facility] Medicine)  Extended Emergency Contact Information Primary Emergency Contact: Shobert,Robin  [place] Mobile Phone: [phone] Relation: Daughter Secondary Emergency Contact: Rochon,fred Mobile Phone: [phone] Relation: Son  Code Status:  DNR Goals of care: Advanced Directive information    11/23/2023   11:51 AM  Advanced Directives  Does Patient Have a Medical Advance Directive? Yes  Type of Estate agent of [place];Living will;Out of facility DNR (pink MOST or yellow form)  Does patient want to make changes to medical advance directive? No - Patient declined  Copy of Healthcare Power of Attorney in Chart? Yes - validated most recent copy scanned in chart (See row information)     Chief Complaint  Patient presents with   Medication Management    HPI:  Pt is a 89 y.o. female seen today for an acute visit for Medication Management   History of Present Illness The patient presents with hallucinations and blepharitis management.  She is experiencing hallucinations, believing she has bugs in her throat, causing itching and discomfort. She was given cough syrup to alleviate the itching. Her current medication for hallucinations is Seroquel.  She has a history of blepharitis, managed with eye compresses and artificial tears. Her current eye care includes artificial tears, a lid scrub, and Olopatadine  for allergies. There is a discussion about a new medication, Xdemvy, for blepharitis.  Her medication regimen includes Armour thyroid  for thyroid  function, which she has been on long-term. She previously experienced hair loss and feeling unwell when switched to levothyroxine, so the family prefers to continue Armour thyroid . She is also  on chlorhexidine  mouth rinse for oral care following recent dental procedures, and her diet has been adjusted to minced and moist due to dental changes.  She is on several other medications including donepezil  and memantine  for memory. She is also on sertraline  for depression.  She has a history of skin issues, including a rash treated with nystatin and sores on her right cheek, which are being managed with barrier cream and cushion adjustments.  Surgical History: - Dental post capping (A few weeks ago): Capping of dental posts to prevent infection and promote gum regrowth   Results LABS TSH: 0.8 (07/2023) Albumin: 3.4 (05/2023) Glomerular filtration rate (GFR): 49 (04/2023)  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(HCC)    adenocarcinoma of transverse colon   Depression    Fracture, metacarpal 06/2016   5th metacarpal   History of chicken pox    Hypertension    Hypothyroidism    Macular degeneration    left eye   Thyroid  disease    Past Surgical History:  Procedure Laterality Date   CARPAL TUNNEL RELEASE  12/09/2010   CHOLECYSTECTOMY  1995   COLON SURGERY  02/25/2010   transverse colon resection   COLONOSCOPY     ESOPHAGOGASTRODUODENOSCOPY  02/25/2010   GANGLION CYST EXCISION Right 07/26/2016   Procedure: REMOVAL GANGLION OF WRIST;  Surgeon: Mardee Lynwood SQUIBB, MD;  Location: [facility]S;  Service: Orthopedics;  Laterality: Right;   GANGLION CYST EXCISION Right 11/10/2016   Procedure: REMOVAL GANGLION OF WRIST;  Surgeon: Mardee Lynwood SQUIBB, MD;  Location: [facility];  Service: Orthopedics;  Laterality: Right;   HERNIA REPAIR     INTRAMEDULLARY (IM) NAIL INTERTROCHANTERIC Left 09/30/2020  Procedure: INTRAMEDULLARY (IM) NAIL INTERTROCHANTRIC;  Surgeon: Tobie Priest, MD;  Location: [facility] ORS;  Service: Orthopedics;  Laterality: Left;   MULTIPLE TOOTH EXTRACTIONS  06/2015   for dentures   UMBILICAL HERNIA REPAIR  02/25/2010    Allergies  Allergen Reactions   Celebrex  [Celecoxib] Other (See Comments)    GI upset   Coconut (Cocos Nucifera)    Latex Itching    NEGATIVE LATEX IgE (< 0.10) 11/04/2016   Statins Other (See Comments)    Muscle pain   Strawberry (Diagnostic)    Adhesive [Tape] Rash    Outpatient Encounter Medications as of 11/23/2023  Medication Sig   acetaminophen  (TYLENOL ) 325 MG tablet Take 650 mg by mouth 3 (three) times daily.   calcium  carbonate (TUMS - DOSED IN MG ELEMENTAL CALCIUM ) 500 MG chewable tablet Chew 2 tablets (400 mg of elemental calcium  total) by mouth 2 (two) times daily.   Carboxymethylcellulose Sod PF (REFRESH CELLUVISC) 1 % GEL Instill one drop in both eyes twice daily   CHLORHEXIDINE  GLUCONATE, BULK, SOLN 15 mLs by Does not apply route in the morning and at bedtime.   cholecalciferol  (VITAMIN D ) 1000 units tablet Take 1,000 Units by mouth daily.   dextromethorphan-guaiFENesin (TUSSIN DM) 10-100 MG/5ML liquid Take 10 mLs by mouth every 4 (four) hours as needed for cough.   diclofenac Sodium (VOLTAREN) 1 % GEL Apply 2gms to right shoulder topically every 12 hours as needed for pain.   donepezil  (ARICEPT ) 10 MG tablet Take 10 mg by mouth at bedtime.    Emollient (CETAPHIL) cream Apply topically as needed.   EPINEPHrine 0.3 mg/0.3 mL IJ SOAJ injection SMARTSIG:1 Milligram(s) IM Daily   Eyelid Cleansers (OCUSOFT EYELID CLEANSING) PADS Apply to both eyes topically in the evening every Wed, Sat   hydroxypropyl methylcellulose / hypromellose (ISOPTO TEARS / GONIOVISC) 2.5 % ophthalmic solution Place 1 drop into both eyes 2 (two) times daily as needed for dry eyes.   lactase (LACTAID) 3000 units tablet Take 6,000 Units by mouth 3 (three) times daily with meals.   magic mouthwash SOLN Take 10 mLs by mouth 4 (four) times daily as needed for mouth pain.   magnesium  hydroxide (MILK OF MAGNESIA) 400 MG/5ML suspension Take 30 mLs by mouth daily as needed for mild constipation.   memantine  (NAMENDA ) 10 MG tablet Take 10 mg by mouth 2  (two) times daily.   Multiple Vitamin (THEREMS PO) Take 1 tablet by mouth daily.   nirmatrelvir /ritonavir , renal dosing, (PAXLOVID ) 10 x 150 MG & 10 x 100MG  TABS Take 2 tablets by mouth 2 (two) times daily for 5 days. (Take nirmatrelvir  150 mg one tablet twice daily for 5 days and ritonavir  100 mg one tablet twice daily for 5 days) Patient GFR is 49   NP THYROID  15 MG tablet Take 15 mg by mouth every morning.   NP THYROID  60 MG tablet Take 60 mg by mouth in the morning.   olopatadine  (PATANOL) 0.1 % ophthalmic solution Place 1 drop into the left eye daily.   polyethylene glycol (MIRALAX  / GLYCOLAX ) 17 g packet Take 17 g by mouth daily. Monday, Wednesday and Friday.   QUEtiapine (SEROQUEL) 25 MG tablet Take 25 mg by mouth at bedtime. Give Two tablets by mouth in the morning. (Patient taking differently: Take 12.5 mg by mouth 2 (two) times daily.)   senna-docusate (SENOKOT-S) 8.6-50 MG tablet Take 2 tablets by mouth at bedtime.   sertraline  (ZOLOFT ) 100 MG tablet Take 100 mg by mouth at  bedtime.   sertraline  (ZOLOFT ) 25 MG tablet Take 25 mg by mouth at bedtime.   Zinc Oxide (TRIPLE PASTE) 12.8 % ointment Apply 1 Application topically. Every shift.   No facility-administered encounter medications on file as of 11/23/2023.    Review of Systems  Immunization History  Administered Date(s) Administered   Fluad Quad(high Dose 65+) 01/05/2023   Influenza-Unspecified 12/28/2011, 01/16/2013, 02/02/2014, 12/28/2014, 01/14/2015, 12/30/2015, 12/23/2016, 12/29/2021   Moderna Covid-19 Fall Seasonal Vaccine 91yrs & older 06/22/2022   Moderna Sars-Covid-2 Vaccination 03/26/2019, 04/23/2019, 01/25/2020, 08/01/2020, 12/10/2022   PPD Test 12/20/2016   Pneumococcal Conjugate (Pcv15) 09/01/2018   Pneumococcal-Unspecified 09/01/2018   Tdap 12/28/2013   Unspecified SARS-COV-2 Vaccination 12/05/2020, 08/11/2021   Zoster Recombinant(Shingrix) 10/30/2022, 01/30/2023   Pertinent  Health Maintenance Due  Topic Date  Due   Influenza Vaccine  10/14/2023   DEXA SCAN  Discontinued      04/04/2021    8:00 AM 04/04/2021    8:46 PM 04/05/2021    9:00 PM 04/06/2021   11:00 AM 10/21/2022   12:35 PM  Fall Risk  Falls in the past year?     0  (RETIRED) Patient Fall Risk Level High fall risk  High fall risk  High fall risk  High fall risk    Patient at Risk for Falls Due to     Impaired balance/gait;Impaired mobility  Fall risk Follow up     Falls evaluation completed     Data saved with a previous flowsheet row definition   Functional Status Survey:    Vitals:   11/23/23 1143  BP: 126/75  Pulse: 84  Resp: 16  Temp: (!) 97.2 F (36.2 C)  SpO2: 91%  Weight: 158 lb (71.7 kg)  Height: 5' 3 (1.6 m)   Body mass index is 27.99 kg/m. Physical Exam Cardiovascular:     Rate and Rhythm: Normal rate.  Abdominal:     General: Abdomen is flat.     Palpations: Abdomen is soft.  Neurological:     Mental Status: She is alert. Mental status is at baseline.     Labs reviewed: Recent Labs    04/25/23 0000  NA 133*  K 4.7  CL 100  CO2 29*  BUN 24*  CREATININE 1.0  CALCIUM  8.8   Recent Labs    04/25/23 0000  AST 23  ALT 10  ALKPHOS 87  ALBUMIN 3.4*   Recent Labs    04/25/23 0000  WBC 6.4  HGB 11.8*  HCT 37  PLT 196   Lab Results  Component Value Date   TSH 3.60 08/26/2022   No results found for: HGBA1C No results found for: CHOL, HDL, LDLCALC, LDLDIRECT, TRIG, CHOLHDL  Significant Diagnostic Results in last 30 days:  No results found.  Assessment/Plan Dementia with behavioral disturbance and hallucinations Continued hallucinations despite current Seroquel regimen with concerns about sedation at higher doses. - Initiate Zyprexa 5 mg daily, with option to adjust dose based on sedation and effectiveness. - Monitor for side effects such as sedation, weight gain, and metabolic changes. - Consider alternative antipsychotics like Abilify if Zyprexa is not  effective.  Depression in dementia Currently managed with sertraline  125 mg. Previous adjustments were made due to difficulty redirecting thoughts about her husband. - Continue sertraline  125 mg daily. - Reassess sertraline  dosage after evaluating response to changes in antipsychotic therapy.  Nutritional decline with risk of weight loss and possible fluid retention Recent weight gain possibly due to fluid retention. Diet changed to minced  and moist. Consideration of hospice care if criteria met. - Evaluate for signs of fluid retention. - Order blood work to assess nutritional status and organ function. - Consider hospice referral if criteria are met.  Chronic kidney disease, stage 3a Previous kidney function test showed GFR of 49. - Include kidney function tests in upcoming blood work.  Hypothyroidism Well-managed on Armour thyroid . Previous attempts to switch to levothyroxine were unsuccessful due to adverse effects. - Continue Armour thyroid .  Oral mucosal healing post dental procedure Recent dental procedure with post capping. Risk of infection due to gum healing. - Continue chlorhexidine  mouth rinse for oral care.  Blepharitis Current management includes artificial tears, lid scrubs, and warm compresses. No evidence of mites as a cause, so Terressa is not indicated. - Continue current management with artificial tears, lid scrubs, and warm compresses. - Check with ophthalmologist for further recommendations if needed.  Allergic conjunctivitis Managed with Olopatadine  eye drops. - Continue Olopatadine  eye drops.  Constipation Managed with scheduled milk of magnesia, Miralax , and Senna S. - Continue current constipation management regimen.  Intertrigo (rash in abdominal folds) Managed with nystatin and regular hygiene. - Continue nystatin application. - Ensure regular hygiene and cleaning of affected areas.  Skin breakdown of right buttock/cheek Described as shearing of  thin skin rather than pressure injury. - Continue application of triple paste barrier cream. - Adjust cushions as necessary to prevent further skin breakdown.  Osteoarthritis Managed with Voltaren gel for symptomatic relief. - Continue Voltaren gel as needed for arthritis pain.  Goals of Care Discussion about transitioning to hospice care due to decline in function and quality of life. Family has given permission for end-of-life care. She has expressed readiness for end-of-life, and family supports this decision. - Evaluate eligibility for hospice care based on clinical criteria and lab results. - Proceed with hospice referral if criteria are met.  Family/ staff Communication: no  Labs/tests ordered:  none   I spent greater than 45 minutes for the care of this patient in face to face time, chart review, clinical documentation, patient education.

## 2023-11-24 LAB — CBC AND DIFFERENTIAL
HCT: 35 — AB (ref 36–46)
Hemoglobin: 11.4 — AB (ref 12.0–16.0)
Platelets: 197 K/uL (ref 150–400)
WBC: 3

## 2023-11-24 LAB — COMPREHENSIVE METABOLIC PANEL WITH GFR
Albumin: 3.2 — AB (ref 3.5–5.0)
Calcium: 8.2 — AB (ref 8.7–10.7)
Globulin: 3.8
eGFR: 36

## 2023-11-24 LAB — HEPATIC FUNCTION PANEL
ALT: 12 U/L (ref 7–35)
AST: 30 (ref 13–35)
Bilirubin, Total: 0.3

## 2023-11-24 LAB — BASIC METABOLIC PANEL WITH GFR
BUN: 47 — AB (ref 4–21)
CO2: 22 (ref 13–22)
Chloride: 104 (ref 99–108)
Creatinine: 1.4 — AB (ref 0.5–1.1)
Glucose: 68
Potassium: 3.9 meq/L (ref 3.5–5.1)
Sodium: 138 (ref 137–147)

## 2023-11-24 LAB — CBC: RBC: 4.04 (ref 3.87–5.11)

## 2023-11-27 ENCOUNTER — Encounter: Payer: Self-pay | Admitting: Student

## 2023-12-29 ENCOUNTER — Non-Acute Institutional Stay (SKILLED_NURSING_FACILITY): Payer: Self-pay | Admitting: Nurse Practitioner

## 2023-12-29 ENCOUNTER — Encounter: Payer: Self-pay | Admitting: Nurse Practitioner

## 2023-12-29 DIAGNOSIS — K5904 Chronic idiopathic constipation: Secondary | ICD-10-CM

## 2023-12-29 DIAGNOSIS — F330 Major depressive disorder, recurrent, mild: Secondary | ICD-10-CM

## 2023-12-29 DIAGNOSIS — E039 Hypothyroidism, unspecified: Secondary | ICD-10-CM | POA: Diagnosis not present

## 2023-12-29 DIAGNOSIS — F02C3 Dementia in other diseases classified elsewhere, severe, with mood disturbance: Secondary | ICD-10-CM

## 2023-12-29 DIAGNOSIS — M150 Primary generalized (osteo)arthritis: Secondary | ICD-10-CM

## 2023-12-29 DIAGNOSIS — D509 Iron deficiency anemia, unspecified: Secondary | ICD-10-CM

## 2023-12-29 DIAGNOSIS — G301 Alzheimer's disease with late onset: Secondary | ICD-10-CM

## 2023-12-29 NOTE — Progress Notes (Signed)
 Location:  Other [place].  Nursing Home Room Number: [facility] [id number] Place of Service:  SNF [phone]) Harlene An, NP  PCP: Abdul Fine, MD  Patient Care Team: Abdul Fine, MD as PCP - [facility])  Extended Emergency Contact Information Primary Emergency Contact: Shobert,Robin  [place] Mobile Phone: [phone] Relation: Daughter Secondary Emergency Contact: Consolo,fred Mobile Phone: [phone] Relation: Son  Goals of care: Advanced Directive information    11/23/2023   11:51 AM  Advanced Directives  Does Patient Have a Medical Advance Directive? Yes  Type of Estate agent of [place];Living will;Out of facility DNR (pink MOST or yellow form)  Does patient want to make changes to medical advance directive? No - Patient declined  Copy of Healthcare Power of Attorney in Chart? Yes - validated most recent copy scanned in chart (See row information)     Chief Complaint  Patient presents with   Medical Management of Chronic Issues    Medical Management of Chronic Issues.     HPI:  Pt is a 89 y.o. female seen today for medical management of chronic disease. Pt with hx of dementia with behaviors, hypothyroid, constipation, anxiety/depression, OA.  She has been doing well. She is currently on hospice.  Continues to have some delusion but no hallucinations - she was changed to zyprexa and tolerating well.  Mood has been doing well.  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   Depression    Fracture, metacarpal 06/2016   5th metacarpal   History of chicken pox    Hypertension    Hypothyroidism    Macular degeneration    left eye   Thyroid  disease    Past Surgical History:  Procedure Laterality Date   CARPAL TUNNEL RELEASE  12/09/2010   CHOLECYSTECTOMY  1995    COLON SURGERY  02/25/2010   transverse colon resection   COLONOSCOPY     ESOPHAGOGASTRODUODENOSCOPY  02/25/2010   GANGLION CYST EXCISION Right 07/26/2016   Procedure: REMOVAL GANGLION OF WRIST;  Surgeon: Mardee Lynwood SQUIBB, MD;  Location: [facility] ORS;  Service: Orthopedics;  Laterality: Right;   GANGLION CYST EXCISION Right 11/10/2016   Procedure: REMOVAL GANGLION OF WRIST;  Surgeon: Mardee Lynwood SQUIBB, MD;  Location: [facility] ORS;  Service: Orthopedics;  Laterality: Right;   HERNIA REPAIR     INTRAMEDULLARY (IM) NAIL INTERTROCHANTERIC Left 09/30/2020   Procedure: INTRAMEDULLARY (IM) NAIL INTERTROCHANTRIC;  Surgeon: Tobie Priest, MD;  Location: [facility] ORS;  Service: Orthopedics;  Laterality: Left;   MULTIPLE TOOTH EXTRACTIONS  06/2015   for dentures   UMBILICAL HERNIA REPAIR  02/25/2010    Allergies  Allergen Reactions   Celebrex [Celecoxib] Other (See Comments)    GI upset   Coconut (Cocos Nucifera)    Latex Itching    NEGATIVE LATEX IgE (< 0.10) 11/04/2016   Statins Other (See Comments)    Muscle pain   Strawberry (Diagnostic)    Adhesive [Tape] Rash    Outpatient Encounter Medications as of 12/29/2023  Medication Sig   acetaminophen  (TYLENOL ) 325 MG tablet Take 650 mg by mouth 3 (three) times daily.   Carboxymethylcellulose Sod PF (REFRESH CELLUVISC) 1 % GEL Instill one drop in both eyes twice daily   CHLORHEXIDINE  GLUCONATE, BULK, SOLN 15 mLs by Does not apply route in the morning and at bedtime.   dextromethorphan-guaiFENesin (TUSSIN  DM) 10-100 MG/5ML liquid Take 10 mLs by mouth every 4 (four) hours as needed for cough.   diclofenac Sodium (VOLTAREN) 1 % GEL Apply 2gms to right shoulder topically every 12 hours as needed for pain.   Emollient (CETAPHIL) cream Apply topically as needed.   EPINEPHrine 0.3 mg/0.3 mL IJ SOAJ injection SMARTSIG:1 Milligram(s) IM Daily   Eyelid Cleansers (OCUSOFT EYELID CLEANSING) PADS Apply to both eyes topically in the evening every Wed, Sat   hydroxypropyl  methylcellulose / hypromellose (ISOPTO TEARS / GONIOVISC) 2.5 % ophthalmic solution Place 1 drop into both eyes 2 (two) times daily as needed for dry eyes.   lactase (LACTAID) 3000 units tablet Take 6,000 Units by mouth 3 (three) times daily with meals.   magnesium  hydroxide (MILK OF MAGNESIA) 400 MG/5ML suspension Take 30 mLs by mouth daily as needed for mild constipation.   NP THYROID  15 MG tablet Take 15 mg by mouth every morning.   NP THYROID  60 MG tablet Take 60 mg by mouth in the morning.   OLANZapine (ZYPREXA) 5 MG tablet Take 5 mg by mouth at bedtime.   olopatadine  (PATANOL) 0.1 % ophthalmic solution Place 1 drop into the left eye daily.   polyethylene glycol (MIRALAX  / GLYCOLAX ) 17 g packet Take 17 g by mouth daily. Monday, Wednesday and Friday.   senna-docusate (SENOKOT-S) 8.6-50 MG tablet Take 2 tablets by mouth at bedtime.   sertraline  (ZOLOFT ) 100 MG tablet Take 100 mg by mouth at bedtime.   sertraline  (ZOLOFT ) 25 MG tablet Take 25 mg by mouth at bedtime.   Zinc Oxide (TRIPLE PASTE) 12.8 % ointment Apply 1 Application topically. Every shift.   calcium  carbonate (TUMS - DOSED IN MG ELEMENTAL CALCIUM ) 500 MG chewable tablet Chew 2 tablets (400 mg of elemental calcium  total) by mouth 2 (two) times daily. (Patient not taking: Reported on 12/29/2023)   cholecalciferol  (VITAMIN D ) 1000 units tablet Take 1,000 Units by mouth daily. (Patient not taking: Reported on 12/29/2023)   donepezil  (ARICEPT ) 10 MG tablet Take 10 mg by mouth at bedtime.  (Patient not taking: Reported on 12/29/2023)   magic mouthwash SOLN Take 10 mLs by mouth 4 (four) times daily as needed for mouth pain. (Patient not taking: Reported on 12/29/2023)   memantine  (NAMENDA ) 10 MG tablet Take 10 mg by mouth 2 (two) times daily. (Patient not taking: Reported on 12/29/2023)   Multiple Vitamin (THEREMS PO) Take 1 tablet by mouth daily. (Patient not taking: Reported on 12/29/2023)   QUEtiapine (SEROQUEL) 25 MG tablet Take 25 mg  by mouth at bedtime. Give Two tablets by mouth in the morning. (Patient taking differently: Take 12.5 mg by mouth 2 (two) times daily.)   No facility-administered encounter medications on file as of 12/29/2023.    Review of Systems  Unable to perform ROS: Dementia     Immunization History  Administered Date(s) Administered   Fluad Quad(high Dose 65+) 01/05/2023   Influenza-Unspecified 12/28/2011, 01/16/2013, 02/02/2014, 12/28/2014, 01/14/2015, 12/30/2015, 12/23/2016, 12/29/2021, 12/27/2023   Moderna Covid-19 Fall Seasonal Vaccine 31yrs & older 06/22/2022   Moderna Sars-Covid-2 Vaccination 03/26/2019, 04/23/2019, 01/25/2020, 08/01/2020, 12/10/2022   PPD Test 12/20/2016   Pneumococcal Conjugate (Pcv15) 09/01/2018   Pneumococcal-Unspecified 09/01/2018   Tdap 12/28/2013   Unspecified SARS-COV-2 Vaccination 12/05/2020, 08/11/2021   Zoster Recombinant(Shingrix) 10/30/2022, 01/30/2023   Pertinent  Health Maintenance Due  Topic Date Due   Influenza Vaccine  Completed   DEXA SCAN  Discontinued      04/04/2021    8:00 AM  04/04/2021    8:46 PM 04/05/2021    9:00 PM 04/06/2021   11:00 AM 10/21/2022   12:35 PM  Fall Risk  Falls in the past year?     0  (RETIRED) Patient Fall Risk Level High fall risk  High fall risk  High fall risk  High fall risk    Patient at Risk for Falls Due to     Impaired balance/gait;Impaired mobility  Fall risk Follow up     Falls evaluation completed     Data saved with a previous flowsheet row definition   Functional Status Survey:    Vitals:   12/29/23 1142  BP: 117/67  Pulse: 88  Resp: 18  Temp: 98 F (36.7 C)  SpO2: 95%  Weight: 145 lb 6.4 oz (66 kg)  Height: 5' 3 (1.6 m)   Body mass index is 25.76 kg/m. Physical Exam Constitutional:      General: She is not in acute distress.    Appearance: She is well-developed. She is not diaphoretic.  HENT:     Head: Normocephalic and atraumatic.     Mouth/Throat:     Pharynx: No oropharyngeal exudate.   Eyes:     Conjunctiva/sclera: Conjunctivae normal.     Pupils: Pupils are equal, round, and reactive to light.  Cardiovascular:     Rate and Rhythm: Normal rate and regular rhythm.     Heart sounds: Normal heart sounds.  Pulmonary:     Effort: Pulmonary effort is normal.     Breath sounds: Normal breath sounds.  Abdominal:     General: Bowel sounds are normal.     Palpations: Abdomen is soft.  Musculoskeletal:     Cervical back: Normal range of motion and neck supple.     Right lower leg: No edema.     Left lower leg: No edema.  Skin:    General: Skin is warm and dry.  Neurological:     Mental Status: She is alert.     Motor: Weakness present.     Gait: Gait abnormal.  Psychiatric:        Mood and Affect: Mood normal.     Labs reviewed: Recent Labs    04/25/23 0000 11/24/23 0000  NA 133* 138  K 4.7 3.9  CL 100 104  CO2 29* 22  BUN 24* 47*  CREATININE 1.0 1.4*  CALCIUM  8.8 8.2*   Recent Labs    04/25/23 0000 11/24/23 0000  AST 23 30  ALT 10 12  ALKPHOS 87  --   ALBUMIN 3.4* 3.2*   Recent Labs    04/25/23 0000 11/24/23 0000  WBC 6.4 3.0  HGB 11.8* 11.4*  HCT 37 35*  PLT 196 197   Lab Results  Component Value Date   TSH 3.60 08/26/2022   No results found for: HGBA1C No results found for: CHOL, HDL, LDLCALC, LDLDIRECT, TRIG, CHOLHDL  Significant Diagnostic Results in last 30 days:  No results found.  Assessment/Plan Severe late onset Alzheimer's dementia with mood disturbance (HCC) Stable, no acute changes in cognitive or functional status, continue supportive care.  Currently on Zyprexa due to hallucinations and doing well.   Primary osteoarthritis involving multiple joints Pain controlled on scheduled tylenol  TID  Hypothyroidism Continues on NP thyroid  75 mg daily  Chronic idiopathic constipation Well controlled and she has been refusing miralax , will stop at this time and monitor.   Depression Stable on zoloft  125 mg  daily   Iron deficiency anemia Blood counts  stable on recent lab, monitor every 6 months     Margareth Kanner K. Caro BODILY [facility] [phone]

## 2024-01-05 DIAGNOSIS — K5904 Chronic idiopathic constipation: Secondary | ICD-10-CM | POA: Insufficient documentation

## 2024-01-05 NOTE — Assessment & Plan Note (Signed)
 Stable on zoloft  125 mg daily

## 2024-01-05 NOTE — Assessment & Plan Note (Signed)
 Pain controlled on scheduled tylenol  TID

## 2024-01-05 NOTE — Assessment & Plan Note (Addendum)
 Stable, no acute changes in cognitive or functional status, continue supportive care.  Currently on Zyprexa due to hallucinations and doing well.

## 2024-01-05 NOTE — Assessment & Plan Note (Addendum)
 Continues on NP thyroid  75 mg daily

## 2024-01-05 NOTE — Assessment & Plan Note (Signed)
 Blood counts stable on recent lab, monitor every 6 months

## 2024-01-05 NOTE — Assessment & Plan Note (Signed)
 Well controlled and she has been refusing miralax , will stop at this time and monitor.

## 2024-01-10 ENCOUNTER — Non-Acute Institutional Stay (SKILLED_NURSING_FACILITY): Payer: Self-pay | Admitting: Internal Medicine

## 2024-01-10 ENCOUNTER — Encounter: Payer: Self-pay | Admitting: Internal Medicine

## 2024-01-10 DIAGNOSIS — F02C3 Dementia in other diseases classified elsewhere, severe, with mood disturbance: Secondary | ICD-10-CM

## 2024-01-10 DIAGNOSIS — Z66 Do not resuscitate: Secondary | ICD-10-CM | POA: Diagnosis not present

## 2024-01-10 DIAGNOSIS — Z515 Encounter for palliative care: Secondary | ICD-10-CM | POA: Diagnosis not present

## 2024-01-10 DIAGNOSIS — G301 Alzheimer's disease with late onset: Secondary | ICD-10-CM | POA: Diagnosis not present

## 2024-01-10 NOTE — Progress Notes (Unsigned)
 [facility] Acute Care Progress Note    Location:  Other [place].  Nursing Home Room Number: [facility] [id number] Place of Service:  SNF (31)   Laurence Locus, DO   Patient Care Team: Laurence Locus, DO as PCP - General (Internal Medicine)   Extended Emergency Contact Information Primary Emergency Contact: Shobert,Robin  [place] Mobile Phone: [phone] Relation: Daughter Secondary Emergency Contact: Orrick,fred Mobile Phone: [phone] Relation: Son   Goals of care: Advanced Directive information    11/23/2023   11:51 AM  Advanced Directives  Does Patient Have a Medical Advance Directive? Yes  Type of Estate Agent of [place];Living will;Out of facility DNR (pink MOST or yellow form)  Does patient want to make changes to medical advance directive? No - Patient declined  Copy of Healthcare Power of Attorney in Chart? Yes - validated most recent copy scanned in chart (See row information)     CODE STATUS: Do Not Resuscitate (DNR)   Chief Complaint  Patient presents with   Medication Management    Medication Management      HPI: Pt is a 89 y.o. female seen today for an acute visit for Medication Management.  Met with the patient's daughter Grayce and the son-in-law Norleen.  They live in [place] .  They come up to see the patient about every 5 or 6 weeks.  They want to meet with the new medical director here at [facility] which is me.  Patient is currently enrolled in hospice care.  She has dementia.  Zyprexa has been useful in helping to control her hallucinations and delusions.  Family declines any further blood work.  Discussed with the daughter that patient still has anxiety and that stopping Zoloft  Lafonda Patron exacerbate her anxiety.  Daughter wants to continue Zoloft  at current doses along with Zyprexa at current doses.  Patient not having any pain.  Discussed the side effects of Zyprexa which sometimes causes weight gain.  Patient has been  having difficulty with loss of appetite.  The side effect of Zyprexa Loye Reininger be beneficial for her.  Daughter is aware of potential cardiac and hematologic side effects of Zyprexa.  Daughter states she wants to continue Zyprexa.     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[facility])    adenocarcinoma of transverse colon   Depression    Fracture, metacarpal 06/2016   5th metacarpal   History of chicken pox    Hypertension    Hypothyroidism    Macular degeneration    left eye   Thyroid  disease    Past Surgical History:  Procedure Laterality Date   CARPAL TUNNEL RELEASE  12/09/2010   CHOLECYSTECTOMY  1995   COLON SURGERY  02/25/2010   transverse colon resection   COLONOSCOPY     ESOPHAGOGASTRODUODENOSCOPY  02/25/2010   GANGLION CYST EXCISION Right 07/26/2016   Procedure: REMOVAL GANGLION OF WRIST;  Surgeon: Mardee Lynwood SQUIBB, MD;  Location: [facility] ORS;  Service: Orthopedics;  Laterality: Right;   GANGLION CYST EXCISION Right 11/10/2016   Procedure: REMOVAL GANGLION OF WRIST;  Surgeon: Mardee Lynwood SQUIBB, MD;  Location: [facility] ORS;  Service: Orthopedics;  Laterality: Right;   HERNIA REPAIR     INTRAMEDULLARY (IM) NAIL INTERTROCHANTERIC Left 09/30/2020   Procedure: INTRAMEDULLARY (IM) NAIL INTERTROCHANTRIC;  Surgeon: Tobie Priest, MD;  Location: [facility] ORS;  Service: Orthopedics;  Laterality: Left;   MULTIPLE TOOTH EXTRACTIONS  06/2015   for dentures   UMBILICAL HERNIA REPAIR  02/25/2010  Allergies  Allergen Reactions   Celebrex [Celecoxib] Other (See Comments)    GI upset   Coconut (Cocos Nucifera)    Latex Itching    NEGATIVE LATEX IgE (< 0.10) 11/04/2016   Statins Other (See Comments)    Muscle pain   Strawberry (Diagnostic)    Adhesive [Tape] Rash     Outpatient Encounter Medications as of 01/10/2024  Medication Sig   acetaminophen  (TYLENOL ) 325 MG tablet Take 650 mg by mouth 3 (three) times daily.   Carboxymethylcellulose Sod PF (REFRESH CELLUVISC) 1 % GEL Instill one  drop in both eyes twice daily   CHLORHEXIDINE  GLUCONATE, BULK, SOLN 15 mLs by Does not apply route in the morning and at bedtime.   dextromethorphan-guaiFENesin (TUSSIN DM) 10-100 MG/5ML liquid Take 10 mLs by mouth every 4 (four) hours as needed for cough.   diclofenac Sodium (VOLTAREN) 1 % GEL Apply 2gms to right shoulder topically every 12 hours as needed for pain.   Emollient (CETAPHIL) cream Apply topically as needed.   EPINEPHrine 0.3 mg/0.3 mL IJ SOAJ injection SMARTSIG:1 Milligram(s) IM Daily   Eyelid Cleansers (OCUSOFT EYELID CLEANSING) PADS Apply to both eyes topically in the evening every Wed, Sat   hydroxypropyl methylcellulose / hypromellose (ISOPTO TEARS / GONIOVISC) 2.5 % ophthalmic solution Place 1 drop into both eyes 2 (two) times daily as needed for dry eyes.   lactase (LACTAID) 3000 units tablet Take 6,000 Units by mouth 3 (three) times daily with meals.   magnesium  hydroxide (MILK OF MAGNESIA) 400 MG/5ML suspension Take 30 mLs by mouth daily as needed for mild constipation.   NP THYROID  15 MG tablet Take 15 mg by mouth every morning.   NP THYROID  60 MG tablet Take 60 mg by mouth in the morning.   OLANZapine (ZYPREXA) 5 MG tablet Take 5 mg by mouth at bedtime.   olopatadine  (PATANOL) 0.1 % ophthalmic solution Place 1 drop into the left eye daily.   polyethylene glycol (MIRALAX  / GLYCOLAX ) 17 g packet Take 17 g by mouth daily. Monday, Wednesday and Friday.   senna-docusate (SENOKOT-S) 8.6-50 MG tablet Take 2 tablets by mouth at bedtime.   sertraline  (ZOLOFT ) 100 MG tablet Take 100 mg by mouth at bedtime.   sertraline  (ZOLOFT ) 25 MG tablet Take 25 mg by mouth at bedtime.   Zinc Oxide (TRIPLE PASTE) 12.8 % ointment Apply 1 Application topically. Every shift.   No facility-administered encounter medications on file as of 01/10/2024.     Review of Systems  Unable to perform ROS: Dementia     Immunization History  Administered Date(s) Administered   Fluad Quad(high Dose 65+)  01/05/2023   Influenza-Unspecified 12/28/2011, 01/16/2013, 02/02/2014, 12/28/2014, 01/14/2015, 12/30/2015, 12/23/2016, 12/29/2021, 12/27/2023, 01/06/2024   Moderna Covid-19 Fall Seasonal Vaccine 10yrs & older 06/22/2022   Moderna Sars-Covid-2 Vaccination 03/26/2019, 04/23/2019, 01/25/2020, 08/01/2020, 12/10/2022   PPD Test 12/20/2016   Pneumococcal Conjugate (Pcv15) 09/01/2018   Pneumococcal-Unspecified 09/01/2018   Tdap 12/28/2013   Unspecified SARS-COV-2 Vaccination 12/05/2020, 08/11/2021, 01/06/2024   Zoster Recombinant(Shingrix) 10/30/2022, 01/30/2023   Pertinent  Health Maintenance Due  Topic Date Due   Influenza Vaccine  Completed   DEXA SCAN  Discontinued      04/04/2021    8:00 AM 04/04/2021    8:46 PM 04/05/2021    9:00 PM 04/06/2021   11:00 AM 10/21/2022   12:35 PM  Fall Risk  Falls in the past year?     0  (RETIRED) Patient Fall Risk Level High fall risk  High  fall risk  High fall risk  High fall risk    Patient at Risk for Falls Due to     Impaired balance/gait;Impaired mobility  Fall risk Follow up     Falls evaluation completed     Data saved with a previous flowsheet row definition   Functional Status Survey:     Vitals:   01/10/24 1234  BP: 132/77  Pulse: 87  Resp: 18  Temp: (!) 97.3 F (36.3 C)  SpO2: 92%  Weight: 145 lb 6.4 oz (66 kg)  Height: 5' 3 (1.6 m)   Body mass index is 25.76 kg/m. Physical Exam Vitals and nursing note reviewed.  Constitutional:      General: She is not in acute distress.    Appearance: She is not toxic-appearing.     Comments: Awake. She does recognize her dtr Elderly female. Sitting in wheelchair  HENT:     Head: Normocephalic and atraumatic.  Cardiovascular:     Rate and Rhythm: Normal rate and regular rhythm.  Pulmonary:     Effort: Pulmonary effort is normal.  Abdominal:     General: Abdomen is flat. There is no distension.     Tenderness: There is no abdominal tenderness.  Skin:    General: Skin is warm and  dry.     Capillary Refill: Capillary refill takes less than 2 seconds.  Neurological:     Mental Status: She is disoriented.      Labs reviewed: Recent Labs    04/25/23 0000 11/24/23 0000  NA 133* 138  K 4.7 3.9  CL 100 104  CO2 29* 22  BUN 24* 47*  CREATININE 1.0 1.4*  CALCIUM  8.8 8.2*   Recent Labs    04/25/23 0000 11/24/23 0000  AST 23 30  ALT 10 12  ALKPHOS 87  --   ALBUMIN 3.4* 3.2*   Recent Labs    04/25/23 0000 11/24/23 0000  WBC 6.4 3.0  HGB 11.8* 11.4*  HCT 37 35*  PLT 196 197   Lab Results  Component Value Date   TSH 3.60 08/26/2022   Assessment & Plan Hospice care Patient currently enrolled in hospice care.  She is a DNR/DNI.  Hospice is making recommendations on medications.Met with the patient's daughter Grayce and the son-in-law Norleen.  They live in [place] .  They come up to see the patient about every 5 or 6 weeks.  They want to meet with the new medical director here at [facility] which is me.  Patient is currently enrolled in hospice care.  She has dementia.  Zyprexa has been useful in helping to control her hallucinations and delusions.  Family declines any further blood work.  Discussed with the daughter that patient still has anxiety and that stopping Zoloft  Foye Damron exacerbate her anxiety.  Daughter wants to continue Zoloft  at current doses along with Zyprexa at current doses.  Patient not having any pain.  Discussed the side effects of Zyprexa which sometimes causes weight gain.  Patient has been having difficulty with loss of appetite.  The side effect of Zyprexa Kanyon Bunn be beneficial for her.  Daughter is aware of potential cardiac and hematologic side effects of Zyprexa.  Daughter states she wants to continue Zyprexa.     Severe late onset Alzheimer's dementia with mood disturbance (HCC) Continue with Zyprexa 5 mg nightly, Zoloft  125 mg nightly.  Family declines any further blood work.  Pharmacy will be notified that family does not want any further  blood work.  DNR (do not resuscitate) Confirmed with family pt is DNR/DNI/hospice care. Dtr Grayce wants pt to live out the rest of her remaining days in peace and comfort without agitation.           [facility] [phone]

## 2024-01-11 DIAGNOSIS — Z515 Encounter for palliative care: Secondary | ICD-10-CM | POA: Insufficient documentation

## 2024-01-11 DIAGNOSIS — Z66 Do not resuscitate: Secondary | ICD-10-CM | POA: Insufficient documentation

## 2024-01-11 NOTE — Assessment & Plan Note (Addendum)
 Continue with Zyprexa 5 mg nightly, Zoloft  125 mg nightly.  Family declines any further blood work.  Pharmacy will be notified that family does not want any further blood work.

## 2024-01-11 NOTE — Assessment & Plan Note (Addendum)
 Patient currently enrolled in hospice care.  She is a DNR/DNI.  Hospice is making recommendations on medications.Met with the patient's daughter Grayce and the son-in-law Norleen.  They live in [place] .  They come up to see the patient about every 5 or 6 weeks.  They want to meet with the new medical director here at [facility] which is me.  Patient is currently enrolled in hospice care.  She has dementia.  Zyprexa has been useful in helping to control her hallucinations and delusions.  Family declines any further blood work.  Discussed with the daughter that patient still has anxiety and that stopping Zoloft  may exacerbate her anxiety.  Daughter wants to continue Zoloft  at current doses along with Zyprexa at current doses.  Patient not having any pain.  Discussed the side effects of Zyprexa which sometimes causes weight gain.  Patient has been having difficulty with loss of appetite.  The side effect of Zyprexa may be beneficial for her.  Daughter is aware of potential cardiac and hematologic side effects of Zyprexa.  Daughter states she wants to continue Zyprexa.

## 2024-01-11 NOTE — Assessment & Plan Note (Addendum)
 Confirmed with family pt is DNR/DNI/hospice care. Dtr Grayce wants pt to live out the rest of her remaining days in peace and comfort without agitation.

## 2024-02-02 ENCOUNTER — Non-Acute Institutional Stay (SKILLED_NURSING_FACILITY): Payer: Self-pay | Admitting: Nurse Practitioner

## 2024-02-02 ENCOUNTER — Encounter: Payer: Self-pay | Admitting: Nurse Practitioner

## 2024-02-02 DIAGNOSIS — F330 Major depressive disorder, recurrent, mild: Secondary | ICD-10-CM

## 2024-02-02 DIAGNOSIS — K5904 Chronic idiopathic constipation: Secondary | ICD-10-CM | POA: Diagnosis not present

## 2024-02-02 DIAGNOSIS — E739 Lactose intolerance, unspecified: Secondary | ICD-10-CM

## 2024-02-02 DIAGNOSIS — F02C3 Dementia in other diseases classified elsewhere, severe, with mood disturbance: Secondary | ICD-10-CM

## 2024-02-02 DIAGNOSIS — M150 Primary generalized (osteo)arthritis: Secondary | ICD-10-CM

## 2024-02-02 DIAGNOSIS — E039 Hypothyroidism, unspecified: Secondary | ICD-10-CM

## 2024-02-02 DIAGNOSIS — G301 Alzheimer's disease with late onset: Secondary | ICD-10-CM

## 2024-02-02 NOTE — Progress Notes (Signed)
 Location:  Other Nursing Home Room Number: [facility] [id number] Place of Service:  SNF (31)  Laurence Locus, DO  Patient Care Team: Laurence Locus, DO as PCP - General (Internal Medicine)  Extended Emergency Contact Information Primary Emergency Contact: Shobert,Robin  [place] Mobile Phone: [phone] Relation: Daughter Secondary Emergency Contact: Manternach,fred Mobile Phone: [phone] Relation: Son  Goals of care: Advanced Directive information    02/02/2024    9:36 AM  Advanced Directives  Does Patient Have a Medical Advance Directive? Yes  Type of Advance Directive Out of facility DNR (pink MOST or yellow form)  Does patient want to make changes to medical advance directive? No - Patient declined     Chief Complaint  Patient presents with   Medical Management of Chronic Issues    Medical Management of Chronic Issues.     HPI:  Pt is a 89 y.o. female seen today for medical management of chronic disease.  Staff reports she is not eating well. Was drinking supplement but now reports it taste funny. Staff attempts to feed her and encourage her to eat.   Reports her shoulders hurt if they are being moved. Otherwise no pain noted or reported.   She has hallucinations but they have improved on Zyprexa.  Continues to have sleepy days and alert days.    Mood has been stable- without increase in anxiety or depression  No shortness of breath, chest pains or LE edema  No skin issues reported.    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[facility])    adenocarcinoma of transverse colon   Depression    Fracture, metacarpal 06/2016   5th metacarpal   History of chicken pox    Hypertension    Hypothyroidism    Macular degeneration    left eye   Thyroid  disease    Past Surgical History:  Procedure Laterality Date   CARPAL TUNNEL RELEASE  12/09/2010   CHOLECYSTECTOMY  1995   COLON SURGERY  02/25/2010   transverse colon resection    COLONOSCOPY     ESOPHAGOGASTRODUODENOSCOPY  02/25/2010   GANGLION CYST EXCISION Right 07/26/2016   Procedure: REMOVAL GANGLION OF WRIST;  Surgeon: Mardee Lynwood SQUIBB, MD;  Location: [facility] ORS;  Service: Orthopedics;  Laterality: Right;   GANGLION CYST EXCISION Right 11/10/2016   Procedure: REMOVAL GANGLION OF WRIST;  Surgeon: Mardee Lynwood SQUIBB, MD;  Location: [facility] ORS;  Service: Orthopedics;  Laterality: Right;   HERNIA REPAIR     INTRAMEDULLARY (IM) NAIL INTERTROCHANTERIC Left 09/30/2020   Procedure: INTRAMEDULLARY (IM) NAIL INTERTROCHANTRIC;  Surgeon: Tobie Priest, MD;  Location: [facility] ORS;  Service: Orthopedics;  Laterality: Left;   MULTIPLE TOOTH EXTRACTIONS  06/2015   for dentures   UMBILICAL HERNIA REPAIR  02/25/2010    Allergies  Allergen Reactions   Celebrex [Celecoxib] Other (See Comments)    GI upset   Coconut (Cocos Nucifera)    Latex Itching    NEGATIVE LATEX IgE (< 0.10) 11/04/2016   Statins Other (See Comments)    Muscle pain   Strawberry (Diagnostic)    Adhesive [Tape] Rash    Outpatient Encounter Medications as of 02/02/2024  Medication Sig   acetaminophen  (TYLENOL ) 325 MG tablet Take 650 mg by mouth 3 (three) times daily.   Carboxymethylcellulose Sod PF (REFRESH CELLUVISC) 1 % GEL Instill one drop in both eyes twice daily   Emollient (CETAPHIL) cream Apply topically as needed.   EPINEPHrine 0.3 mg/0.3 mL IJ SOAJ injection SMARTSIG:1 Milligram(s)  IM Daily   Eyelid Cleansers (OCUSOFT LID SCRUB PLUS EX) Apply topically. Apply to both eyes topically in the evening every Wednesday and Saturday   hydroxypropyl methylcellulose / hypromellose (ISOPTO TEARS / GONIOVISC) 2.5 % ophthalmic solution Place 1 drop into both eyes 2 (two) times daily as needed for dry eyes.   lactase (LACTAID) 3000 units tablet Take 6,000 Units by mouth 3 (three) times daily with meals.   magnesium  hydroxide (MILK OF MAGNESIA) 400 MG/5ML suspension Take 30 mLs by mouth daily as needed for mild constipation.    NP THYROID  15 MG tablet Take 15 mg by mouth every morning.   NP THYROID  60 MG tablet Take 60 mg by mouth in the morning.   OLANZapine (ZYPREXA) 5 MG tablet Take 5 mg by mouth at bedtime.   olopatadine  (PATANOL) 0.1 % ophthalmic solution Place 1 drop into the left eye daily.   polyethylene glycol (MIRALAX  / GLYCOLAX ) 17 g packet Take 17 g by mouth daily. Monday, Wednesday and Friday.   senna-docusate (SENOKOT-S) 8.6-50 MG tablet Take 2 tablets by mouth at bedtime.   sertraline  (ZOLOFT ) 100 MG tablet Take 100 mg by mouth at bedtime.   sertraline  (ZOLOFT ) 25 MG tablet Take 25 mg by mouth at bedtime.   Zinc Oxide (TRIPLE PASTE) 12.8 % ointment Apply 1 Application topically. Every shift.   [DISCONTINUED] CHLORHEXIDINE  GLUCONATE, BULK, SOLN 15 mLs by Does not apply route in the morning and at bedtime.   [DISCONTINUED] dextromethorphan-guaiFENesin (TUSSIN DM) 10-100 MG/5ML liquid Take 10 mLs by mouth every 4 (four) hours as needed for cough.   [DISCONTINUED] diclofenac Sodium (VOLTAREN) 1 % GEL Apply 2gms to right shoulder topically every 12 hours as needed for pain.   [DISCONTINUED] Eyelid Cleansers (OCUSOFT EYELID CLEANSING) PADS Apply to both eyes topically in the evening every Wed, Sat   No facility-administered encounter medications on file as of 02/02/2024.    Review of Systems  Unable to perform ROS: Dementia     Immunization History  Administered Date(s) Administered   Fluad Quad(high Dose 65+) 01/05/2023   Influenza-Unspecified 12/28/2011, 01/16/2013, 02/02/2014, 12/28/2014, 01/14/2015, 12/30/2015, 12/23/2016, 12/29/2021, 12/27/2023, 01/06/2024   Moderna Covid-19 Fall Seasonal Vaccine 31yrs & older 06/22/2022   Moderna Sars-Covid-2 Vaccination 03/26/2019, 04/23/2019, 01/25/2020, 08/01/2020, 12/10/2022   PPD Test 12/20/2016   Pneumococcal Conjugate (Pcv15) 09/01/2018   Pneumococcal-Unspecified 09/01/2018   Tdap 12/28/2013   Unspecified SARS-COV-2 Vaccination 12/05/2020, 08/11/2021,  01/06/2024   Zoster Recombinant(Shingrix) 10/30/2022, 01/30/2023   Pertinent  Health Maintenance Due  Topic Date Due   Influenza Vaccine  Completed   Bone Density Scan  Discontinued      04/04/2021    8:00 AM 04/04/2021    8:46 PM 04/05/2021    9:00 PM 04/06/2021   11:00 AM 10/21/2022   12:35 PM  Fall Risk  Falls in the past year?     0  (RETIRED) Patient Fall Risk Level High fall risk  High fall risk  High fall risk  High fall risk    Patient at Risk for Falls Due to     Impaired balance/gait;Impaired mobility  Fall risk Follow up     Falls evaluation completed     Data saved with a previous flowsheet row definition   Functional Status Survey:    Vitals:   02/02/24 0924  BP: 117/62  Pulse: 87  Resp: 18  Temp: 98.7 F (37.1 C)  SpO2: 91%  Weight: 145 lb 3.2 oz (65.9 kg)  Height: 5' 3 (1.6 m)  Body mass index is 25.72 kg/m. Wt Readings from Last 3 Encounters:  02/02/24 145 lb 3.2 oz (65.9 kg)  01/10/24 145 lb 6.4 oz (66 kg)  12/29/23 145 lb 6.4 oz (66 kg)    Physical Exam Constitutional:      General: She is not in acute distress.    Appearance: She is well-developed. She is not diaphoretic.  HENT:     Head: Normocephalic and atraumatic.     Mouth/Throat:     Pharynx: No oropharyngeal exudate.  Eyes:     Conjunctiva/sclera: Conjunctivae normal.     Pupils: Pupils are equal, round, and reactive to light.  Cardiovascular:     Rate and Rhythm: Normal rate and regular rhythm.     Heart sounds: Normal heart sounds.  Pulmonary:     Effort: Pulmonary effort is normal.     Breath sounds: Normal breath sounds.  Abdominal:     General: Bowel sounds are normal.     Palpations: Abdomen is soft.  Musculoskeletal:     Cervical back: Normal range of motion and neck supple.     Right lower leg: No edema.     Left lower leg: No edema.  Skin:    General: Skin is warm and dry.  Neurological:     Mental Status: She is alert.  Psychiatric:        Mood and Affect: Mood  normal.     Labs reviewed: Recent Labs    04/25/23 0000 11/24/23 0000  NA 133* 138  K 4.7 3.9  CL 100 104  CO2 29* 22  BUN 24* 47*  CREATININE 1.0 1.4*  CALCIUM  8.8 8.2*   Recent Labs    04/25/23 0000 11/24/23 0000  AST 23 30  ALT 10 12  ALKPHOS 87  --   ALBUMIN 3.4* 3.2*   Recent Labs    04/25/23 0000 11/24/23 0000  WBC 6.4 3.0  HGB 11.8* 11.4*  HCT 37 35*  PLT 196 197   Lab Results  Component Value Date   TSH 3.60 08/26/2022   No results found for: HGBA1C No results found for: CHOL, HDL, LDLCALC, LDLDIRECT, TRIG, CHOLHDL  Significant Diagnostic Results in last 30 days:  No results found.  Assessment/Plan Severe late onset Alzheimer's dementia with mood disturbance (HCC) -advanced disease, family wants comfort only without further blood monitoring  Primary osteoarthritis involving multiple joints Pain controlled on scheduled tylenol  TID  Lactose intolerance Continues on lactaid with meals  Hypothyroidism Continues on NP thyroid  75 mg daily  Depression Stable on zoloft  125 mg daily- family does not wish for any titration of this.   Chronic idiopathic constipation Stable on senokot 2 tablets daily    Caraline Deutschman K. Caro BODILY [facility] [phone]

## 2024-02-02 NOTE — Assessment & Plan Note (Signed)
 Continues on lactaid with meals

## 2024-02-02 NOTE — Progress Notes (Deleted)
 Location:  Other [place]  Nursing Home Room Number: [facility] [id number] Place of Service:  SNF [phone]) Harlene An, NP  PCP: Laurence Locus, DO  Patient Care Team: Laurence Locus, DO as PCP - General (Internal Medicine)  Extended Emergency Contact Information Primary Emergency Contact: Shobert,Robin  [place] Mobile Phone: [phone] Relation: Daughter Secondary Emergency Contact: Flemings,fred Mobile Phone: [phone] Relation: Son  Goals of care: Advanced Directive information    02/02/2024    9:36 AM  Advanced Directives  Does Patient Have a Medical Advance Directive? Yes  Type of Advance Directive Out of facility DNR (pink MOST or yellow form)  Does patient want to make changes to medical advance directive? No - Patient declined     Chief Complaint  Patient presents with   Medical Management of Chronic Issues    Medical Management of Chronic Issues.     HPI:  Pt is a 89 y.o. female seen today for medical management of chronic disease.    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[facility])    adenocarcinoma of transverse colon   Depression    Fracture, metacarpal 06/2016   5th metacarpal   History of chicken pox    Hypertension    Hypothyroidism    Macular degeneration    left eye   Thyroid  disease    Past Surgical History:  Procedure Laterality Date   CARPAL TUNNEL RELEASE  12/09/2010   CHOLECYSTECTOMY  1995   COLON SURGERY  02/25/2010   transverse colon resection   COLONOSCOPY     ESOPHAGOGASTRODUODENOSCOPY  02/25/2010   GANGLION CYST EXCISION Right 07/26/2016   Procedure: REMOVAL GANGLION OF WRIST;  Surgeon: Mardee Lynwood SQUIBB, MD;  Location: [facility] ORS;  Service: Orthopedics;  Laterality: Right;   GANGLION CYST EXCISION Right 11/10/2016   Procedure: REMOVAL GANGLION OF WRIST;  Surgeon: Mardee Lynwood SQUIBB, MD;  Location: [facility] ORS;  Service: Orthopedics;  Laterality: Right;   HERNIA REPAIR     INTRAMEDULLARY (IM) NAIL  INTERTROCHANTERIC Left 09/30/2020   Procedure: INTRAMEDULLARY (IM) NAIL INTERTROCHANTRIC;  Surgeon: Tobie Priest, MD;  Location: [facility] ORS;  Service: Orthopedics;  Laterality: Left;   MULTIPLE TOOTH EXTRACTIONS  06/2015   for dentures   UMBILICAL HERNIA REPAIR  02/25/2010    Allergies  Allergen Reactions   Celebrex [Celecoxib] Other (See Comments)    GI upset   Coconut (Cocos Nucifera)    Latex Itching    NEGATIVE LATEX IgE (< 0.10) 11/04/2016   Statins Other (See Comments)    Muscle pain   Strawberry (Diagnostic)    Adhesive [Tape] Rash    Outpatient Encounter Medications as of 02/02/2024  Medication Sig   acetaminophen  (TYLENOL ) 325 MG tablet Take 650 mg by mouth 3 (three) times daily.   Carboxymethylcellulose Sod PF (REFRESH CELLUVISC) 1 % GEL Instill one drop in both eyes twice daily   Emollient (CETAPHIL) cream Apply topically as needed.   EPINEPHrine 0.3 mg/0.3 mL IJ SOAJ injection SMARTSIG:1 Milligram(s) IM Daily   Eyelid Cleansers (OCUSOFT LID SCRUB PLUS EX) Apply topically. Apply to both eyes topically in the evening every Wednesday and Saturday   hydroxypropyl methylcellulose / hypromellose (ISOPTO TEARS / GONIOVISC) 2.5 % ophthalmic solution Place 1 drop into both eyes 2 (two) times daily as needed for dry eyes.   lactase (LACTAID) 3000 units tablet Take 6,000 Units by mouth 3 (three) times daily with meals.   magnesium  hydroxide (MILK OF MAGNESIA) 400 MG/5ML suspension Take 30 mLs  by mouth daily as needed for mild constipation.   NP THYROID  15 MG tablet Take 15 mg by mouth every morning.   NP THYROID  60 MG tablet Take 60 mg by mouth in the morning.   OLANZapine (ZYPREXA) 5 MG tablet Take 5 mg by mouth at bedtime.   olopatadine  (PATANOL) 0.1 % ophthalmic solution Place 1 drop into the left eye daily.   polyethylene glycol (MIRALAX  / GLYCOLAX ) 17 g packet Take 17 g by mouth daily. Monday, Wednesday and Friday.   senna-docusate (SENOKOT-S) 8.6-50 MG tablet Take 2 tablets by  mouth at bedtime.   sertraline  (ZOLOFT ) 100 MG tablet Take 100 mg by mouth at bedtime.   sertraline  (ZOLOFT ) 25 MG tablet Take 25 mg by mouth at bedtime.   Zinc Oxide (TRIPLE PASTE) 12.8 % ointment Apply 1 Application topically. Every shift.   [DISCONTINUED] CHLORHEXIDINE  GLUCONATE, BULK, SOLN 15 mLs by Does not apply route in the morning and at bedtime.   [DISCONTINUED] dextromethorphan-guaiFENesin (TUSSIN DM) 10-100 MG/5ML liquid Take 10 mLs by mouth every 4 (four) hours as needed for cough.   [DISCONTINUED] diclofenac Sodium (VOLTAREN) 1 % GEL Apply 2gms to right shoulder topically every 12 hours as needed for pain.   [DISCONTINUED] Eyelid Cleansers (OCUSOFT EYELID CLEANSING) PADS Apply to both eyes topically in the evening every Wed, Sat   No facility-administered encounter medications on file as of 02/02/2024.    Review of Systems ***  Immunization History  Administered Date(s) Administered   Fluad Quad(high Dose 65+) 01/05/2023   Influenza-Unspecified 12/28/2011, 01/16/2013, 02/02/2014, 12/28/2014, 01/14/2015, 12/30/2015, 12/23/2016, 12/29/2021, 12/27/2023, 01/06/2024   Moderna Covid-19 Fall Seasonal Vaccine 3yrs & older 06/22/2022   Moderna Sars-Covid-2 Vaccination 03/26/2019, 04/23/2019, 01/25/2020, 08/01/2020, 12/10/2022   PPD Test 12/20/2016   Pneumococcal Conjugate (Pcv15) 09/01/2018   Pneumococcal-Unspecified 09/01/2018   Tdap 12/28/2013   Unspecified SARS-COV-2 Vaccination 12/05/2020, 08/11/2021, 01/06/2024   Zoster Recombinant(Shingrix) 10/30/2022, 01/30/2023   Pertinent  Health Maintenance Due  Topic Date Due   Influenza Vaccine  Completed   Bone Density Scan  Discontinued      04/04/2021    8:00 AM 04/04/2021    8:46 PM 04/05/2021    9:00 PM 04/06/2021   11:00 AM 10/21/2022   12:35 PM  Fall Risk  Falls in the past year?     0  (RETIRED) Patient Fall Risk Level High fall risk  High fall risk  High fall risk  High fall risk    Patient at Risk for Falls Due to      Impaired balance/gait;Impaired mobility  Fall risk Follow up     Falls evaluation completed     Data saved with a previous flowsheet row definition   Functional Status Survey:    Vitals:   02/02/24 0924  BP: 117/62  Pulse: 87  Resp: 18  Temp: 98.7 F (37.1 C)  SpO2: 91%  Weight: 145 lb 3.2 oz (65.9 kg)  Height: 5' 3 (1.6 m)   Body mass index is 25.72 kg/m. Physical Exam***  Labs reviewed: Recent Labs    04/25/23 0000 11/24/23 0000  NA 133* 138  K 4.7 3.9  CL 100 104  CO2 29* 22  BUN 24* 47*  CREATININE 1.0 1.4*  CALCIUM  8.8 8.2*   Recent Labs    04/25/23 0000 11/24/23 0000  AST 23 30  ALT 10 12  ALKPHOS 87  --   ALBUMIN 3.4* 3.2*   Recent Labs    04/25/23 0000 11/24/23 0000  WBC 6.4  3.0  HGB 11.8* 11.4*  HCT 37 35*  PLT 196 197   Lab Results  Component Value Date   TSH 3.60 08/26/2022   No results found for: HGBA1C No results found for: CHOL, HDL, LDLCALC, LDLDIRECT, TRIG, CHOLHDL  Significant Diagnostic Results in last 30 days:  No results found.  Assessment/Plan No problem-specific Assessment & Plan notes found for this encounter.     Romy Ipock K. Caro BODILY [facility] [phone]

## 2024-02-02 NOTE — Assessment & Plan Note (Signed)
 Stable on senokot 2 tablets daily

## 2024-02-02 NOTE — Assessment & Plan Note (Signed)
 Continues on NP thyroid  75 mg daily

## 2024-02-02 NOTE — Assessment & Plan Note (Signed)
 Pain controlled on scheduled tylenol  TID

## 2024-02-02 NOTE — Assessment & Plan Note (Signed)
-  advanced disease, family wants comfort only without further blood monitoring

## 2024-02-02 NOTE — Assessment & Plan Note (Signed)
 Stable on zoloft  125 mg daily- family does not wish for any titration of this.

## 2024-02-21 ENCOUNTER — Other Ambulatory Visit: Payer: Self-pay | Admitting: Nurse Practitioner

## 2024-02-21 DIAGNOSIS — Z515 Encounter for palliative care: Secondary | ICD-10-CM

## 2024-02-21 MED ORDER — LORAZEPAM 2 MG/ML PO CONC: 0.5000 mg | Freq: Three times a day (TID) | ORAL | 0 refills | Status: DC

## 2024-02-21 MED ORDER — MORPHINE SULFATE (CONCENTRATE) 20 MG/ML PO SOLN: 5.0000 mg | Freq: Four times a day (QID) | ORAL | 0 refills | Status: DC

## 2024-03-15 DEATH — deceased
